# Patient Record
Sex: Female | Born: 1940 | Race: Black or African American | Hispanic: No | Smoking: Never smoker
Health system: Southern US, Community
[De-identification: ages and names within clinical notes are randomized; demographics above are authoritative.]

## PROBLEM LIST (undated history)

## (undated) DIAGNOSIS — Z972 Presence of dental prosthetic device (complete) (partial): Secondary | ICD-10-CM

## (undated) DIAGNOSIS — M810 Age-related osteoporosis without current pathological fracture: Secondary | ICD-10-CM

## (undated) DIAGNOSIS — I10 Essential (primary) hypertension: Secondary | ICD-10-CM

## (undated) DIAGNOSIS — E785 Hyperlipidemia, unspecified: Secondary | ICD-10-CM

## (undated) DIAGNOSIS — M1990 Unspecified osteoarthritis, unspecified site: Secondary | ICD-10-CM

## (undated) DIAGNOSIS — E039 Hypothyroidism, unspecified: Secondary | ICD-10-CM

## (undated) HISTORY — PX: HEMORROIDECTOMY: SUR656

## (undated) HISTORY — DX: Essential (primary) hypertension: I10

## (undated) HISTORY — PX: BREAST BIOPSY: SHX20

## (undated) HISTORY — DX: Hyperlipidemia, unspecified: E78.5

## (undated) HISTORY — DX: Unspecified osteoarthritis, unspecified site: M19.90

## (undated) HISTORY — DX: Hypothyroidism, unspecified: E03.9

## (undated) HISTORY — DX: Age-related osteoporosis without current pathological fracture: M81.0

## (undated) HISTORY — PX: TUBAL LIGATION: SHX77

## (undated) HISTORY — PX: ROTATOR CUFF REPAIR: SHX139

## (undated) HISTORY — PX: INTRAOCULAR LENS INSERTION: SHX110

---

## 1998-11-18 ENCOUNTER — Encounter: Payer: Self-pay | Admitting: Family Medicine

## 1998-11-18 ENCOUNTER — Encounter: Admission: RE | Admit: 1998-11-18 | Discharge: 1998-11-18 | Payer: Self-pay | Admitting: Family Medicine

## 1999-01-06 ENCOUNTER — Other Ambulatory Visit: Admission: RE | Admit: 1999-01-06 | Discharge: 1999-01-06 | Payer: Self-pay | Admitting: Family Medicine

## 1999-11-21 ENCOUNTER — Encounter: Admission: RE | Admit: 1999-11-21 | Discharge: 1999-11-21 | Payer: Self-pay | Admitting: Family Medicine

## 1999-11-21 ENCOUNTER — Encounter: Payer: Self-pay | Admitting: Family Medicine

## 2000-01-06 ENCOUNTER — Other Ambulatory Visit: Admission: RE | Admit: 2000-01-06 | Discharge: 2000-01-06 | Payer: Self-pay | Admitting: Family Medicine

## 2000-01-11 ENCOUNTER — Encounter: Admission: RE | Admit: 2000-01-11 | Discharge: 2000-01-11 | Payer: Self-pay | Admitting: Family Medicine

## 2000-01-11 ENCOUNTER — Encounter: Payer: Self-pay | Admitting: Family Medicine

## 2000-05-22 ENCOUNTER — Encounter: Payer: Self-pay | Admitting: Emergency Medicine

## 2000-05-22 ENCOUNTER — Emergency Department (HOSPITAL_COMMUNITY): Admission: EM | Admit: 2000-05-22 | Discharge: 2000-05-22 | Payer: Self-pay | Admitting: Emergency Medicine

## 2000-10-03 ENCOUNTER — Encounter: Payer: Self-pay | Admitting: Specialist

## 2000-10-10 ENCOUNTER — Ambulatory Visit: Admission: RE | Admit: 2000-10-10 | Discharge: 2000-10-10 | Payer: Self-pay | Admitting: Specialist

## 2000-11-21 ENCOUNTER — Encounter: Payer: Self-pay | Admitting: Family Medicine

## 2000-11-21 ENCOUNTER — Encounter: Admission: RE | Admit: 2000-11-21 | Discharge: 2000-11-21 | Payer: Self-pay | Admitting: Family Medicine

## 2001-01-07 ENCOUNTER — Other Ambulatory Visit: Admission: RE | Admit: 2001-01-07 | Discharge: 2001-01-07 | Payer: Self-pay | Admitting: Family Medicine

## 2001-11-26 ENCOUNTER — Encounter: Admission: RE | Admit: 2001-11-26 | Discharge: 2001-11-26 | Payer: Self-pay | Admitting: Family Medicine

## 2001-11-26 ENCOUNTER — Encounter: Payer: Self-pay | Admitting: Family Medicine

## 2002-01-08 ENCOUNTER — Other Ambulatory Visit: Admission: RE | Admit: 2002-01-08 | Discharge: 2002-01-24 | Payer: Self-pay | Admitting: Internal Medicine

## 2002-02-18 ENCOUNTER — Ambulatory Visit (HOSPITAL_COMMUNITY): Admission: RE | Admit: 2002-02-18 | Discharge: 2002-02-18 | Payer: Self-pay | Admitting: *Deleted

## 2002-02-18 ENCOUNTER — Encounter (INDEPENDENT_AMBULATORY_CARE_PROVIDER_SITE_OTHER): Payer: Self-pay

## 2002-11-28 ENCOUNTER — Encounter: Admission: RE | Admit: 2002-11-28 | Discharge: 2002-11-28 | Payer: Self-pay | Admitting: Family Medicine

## 2003-01-28 ENCOUNTER — Other Ambulatory Visit: Admission: RE | Admit: 2003-01-28 | Discharge: 2003-01-28 | Payer: Self-pay | Admitting: Family Medicine

## 2003-12-07 ENCOUNTER — Encounter: Admission: RE | Admit: 2003-12-07 | Discharge: 2003-12-07 | Payer: Self-pay | Admitting: Family Medicine

## 2004-04-07 ENCOUNTER — Ambulatory Visit: Payer: Self-pay | Admitting: Family Medicine

## 2004-04-26 ENCOUNTER — Ambulatory Visit: Payer: Self-pay | Admitting: Family Medicine

## 2004-04-28 ENCOUNTER — Ambulatory Visit: Payer: Self-pay | Admitting: Family Medicine

## 2004-05-02 ENCOUNTER — Encounter: Admission: RE | Admit: 2004-05-02 | Discharge: 2004-05-02 | Payer: Self-pay | Admitting: Family Medicine

## 2004-05-18 ENCOUNTER — Ambulatory Visit: Payer: Self-pay | Admitting: Family Medicine

## 2004-06-28 ENCOUNTER — Ambulatory Visit: Payer: Self-pay | Admitting: Family Medicine

## 2004-09-27 ENCOUNTER — Ambulatory Visit: Payer: Self-pay | Admitting: Family Medicine

## 2004-12-23 ENCOUNTER — Encounter: Admission: RE | Admit: 2004-12-23 | Discharge: 2004-12-23 | Payer: Self-pay | Admitting: Family Medicine

## 2005-02-07 ENCOUNTER — Ambulatory Visit: Payer: Self-pay | Admitting: Family Medicine

## 2005-02-16 ENCOUNTER — Ambulatory Visit: Payer: Self-pay | Admitting: Family Medicine

## 2005-03-02 ENCOUNTER — Ambulatory Visit: Payer: Self-pay | Admitting: Family Medicine

## 2005-08-31 ENCOUNTER — Ambulatory Visit: Payer: Self-pay | Admitting: Family Medicine

## 2005-09-05 ENCOUNTER — Ambulatory Visit: Payer: Self-pay | Admitting: Family Medicine

## 2005-12-26 ENCOUNTER — Encounter: Admission: RE | Admit: 2005-12-26 | Discharge: 2005-12-26 | Payer: Self-pay | Admitting: Family Medicine

## 2006-03-15 ENCOUNTER — Ambulatory Visit: Payer: Self-pay | Admitting: Family Medicine

## 2006-03-15 LAB — CONVERTED CEMR LAB
ALT: 16 units/L (ref 0–40)
AST: 18 units/L (ref 0–37)
CO2: 30 meq/L (ref 19–32)
Cholesterol: 212 mg/dL (ref 0–200)
Creatinine, Ser: 1.2 mg/dL (ref 0.4–1.2)
Direct LDL: 101 mg/dL
HDL: 80.4 mg/dL (ref >39.0)
Potassium: 4 meq/L (ref 3.5–5.1)
Sodium: 142 meq/L (ref 135–145)
Triglycerides: 99 mg/dL (ref 0–149)
VLDL: 20 mg/dL (ref 0–40)

## 2006-04-20 ENCOUNTER — Ambulatory Visit: Payer: Self-pay | Admitting: Family Medicine

## 2006-08-08 ENCOUNTER — Encounter (INDEPENDENT_AMBULATORY_CARE_PROVIDER_SITE_OTHER): Payer: Self-pay | Admitting: Internal Medicine

## 2006-08-21 ENCOUNTER — Encounter (INDEPENDENT_AMBULATORY_CARE_PROVIDER_SITE_OTHER): Payer: Self-pay | Admitting: Internal Medicine

## 2006-08-21 ENCOUNTER — Ambulatory Visit: Payer: Self-pay | Admitting: Family Medicine

## 2006-08-21 ENCOUNTER — Other Ambulatory Visit: Admission: RE | Admit: 2006-08-21 | Discharge: 2006-08-21 | Payer: Self-pay | Admitting: Family Medicine

## 2006-08-21 DIAGNOSIS — E7800 Pure hypercholesterolemia, unspecified: Secondary | ICD-10-CM | POA: Insufficient documentation

## 2006-08-21 DIAGNOSIS — M8580 Other specified disorders of bone density and structure, unspecified site: Secondary | ICD-10-CM | POA: Insufficient documentation

## 2006-08-21 DIAGNOSIS — I10 Essential (primary) hypertension: Secondary | ICD-10-CM | POA: Insufficient documentation

## 2006-08-21 DIAGNOSIS — E039 Hypothyroidism, unspecified: Secondary | ICD-10-CM

## 2006-08-23 LAB — CONVERTED CEMR LAB
ALT: 19 units/L (ref 0–35)
BUN: 15 mg/dL (ref 6–23)
CO2: 31 meq/L (ref 19–32)
Creatinine, Ser: 0.9 mg/dL (ref 0.4–1.2)
GFR calc Af Amer: 80 mL/min
GFR calc non Af Amer: 66 mL/min
HDL: 84.5 mg/dL (ref >39.0)
Total CHOL/HDL Ratio: 2.5

## 2006-08-27 ENCOUNTER — Encounter: Admission: RE | Admit: 2006-08-27 | Discharge: 2006-08-27 | Payer: Self-pay | Admitting: Family Medicine

## 2006-12-28 ENCOUNTER — Encounter: Admission: RE | Admit: 2006-12-28 | Discharge: 2006-12-28 | Payer: Self-pay | Admitting: Family Medicine

## 2007-01-08 ENCOUNTER — Encounter (INDEPENDENT_AMBULATORY_CARE_PROVIDER_SITE_OTHER): Payer: Self-pay | Admitting: *Deleted

## 2007-02-22 ENCOUNTER — Ambulatory Visit: Payer: Self-pay | Admitting: Family Medicine

## 2007-02-26 LAB — CONVERTED CEMR LAB
Cholesterol: 175 mg/dL (ref 0–200)
HDL: 76.2 mg/dL (ref >39.0)
LDL Cholesterol: 81 mg/dL (ref 0–99)
Total CHOL/HDL Ratio: 2.3
Triglycerides: 88 mg/dL (ref 0–149)
VLDL: 18 mg/dL (ref 0–40)

## 2007-08-26 ENCOUNTER — Ambulatory Visit: Payer: Self-pay | Admitting: Family Medicine

## 2007-08-27 LAB — CONVERTED CEMR LAB
ALT: 17 units/L (ref 0–35)
Albumin: 3.9 g/dL (ref 3.5–5.2)
Alkaline Phosphatase: 44 units/L (ref 39–117)
Bilirubin, Direct: 0.1 mg/dL (ref 0.0–0.3)
Cholesterol: 195 mg/dL (ref 0–200)
HDL: 81.9 mg/dL (ref >39.0)
Total CHOL/HDL Ratio: 2.4
Total Protein: 7.4 g/dL (ref 6.0–8.3)
Triglycerides: 61 mg/dL (ref 0–149)
VLDL: 12 mg/dL (ref 0–40)

## 2007-08-30 ENCOUNTER — Ambulatory Visit: Payer: Self-pay | Admitting: Family Medicine

## 2007-09-26 ENCOUNTER — Ambulatory Visit: Payer: Self-pay | Admitting: Family Medicine

## 2007-09-26 LAB — CONVERTED CEMR LAB: OCCULT 2: NEGATIVE

## 2007-09-27 ENCOUNTER — Encounter (INDEPENDENT_AMBULATORY_CARE_PROVIDER_SITE_OTHER): Payer: Self-pay | Admitting: *Deleted

## 2007-09-27 ENCOUNTER — Encounter (INDEPENDENT_AMBULATORY_CARE_PROVIDER_SITE_OTHER): Payer: Self-pay | Admitting: Internal Medicine

## 2007-12-30 ENCOUNTER — Encounter: Admission: RE | Admit: 2007-12-30 | Discharge: 2007-12-30 | Payer: Self-pay | Admitting: Family Medicine

## 2008-01-01 ENCOUNTER — Encounter (INDEPENDENT_AMBULATORY_CARE_PROVIDER_SITE_OTHER): Payer: Self-pay | Admitting: *Deleted

## 2008-02-13 ENCOUNTER — Ambulatory Visit: Payer: Self-pay | Admitting: Family Medicine

## 2008-02-17 LAB — CONVERTED CEMR LAB
Alkaline Phosphatase: 44 units/L (ref 39–117)
Cholesterol: 189 mg/dL (ref 0–200)
HDL: 87 mg/dL (ref >39.0)
Total Bilirubin: 1.1 mg/dL (ref 0.3–1.2)
Total CHOL/HDL Ratio: 2.2
Triglycerides: 88 mg/dL (ref 0–149)
VLDL: 18 mg/dL (ref 0–40)

## 2008-08-01 ENCOUNTER — Emergency Department (HOSPITAL_COMMUNITY): Admission: EM | Admit: 2008-08-01 | Discharge: 2008-08-01 | Payer: Self-pay | Admitting: Family Medicine

## 2008-08-17 ENCOUNTER — Ambulatory Visit: Payer: Self-pay | Admitting: Family Medicine

## 2008-08-19 LAB — CONVERTED CEMR LAB
CO2: 30 meq/L (ref 19–32)
Creatinine, Ser: 1 mg/dL (ref 0.4–1.2)
GFR calc non Af Amer: 70.55 mL/min (ref >60)
LDL Cholesterol: 97 mg/dL (ref 0–99)
Triglycerides: 76 mg/dL (ref 0.0–149.0)

## 2008-08-27 ENCOUNTER — Telehealth (INDEPENDENT_AMBULATORY_CARE_PROVIDER_SITE_OTHER): Payer: Self-pay | Admitting: Internal Medicine

## 2008-10-22 ENCOUNTER — Encounter (INDEPENDENT_AMBULATORY_CARE_PROVIDER_SITE_OTHER): Payer: Self-pay | Admitting: *Deleted

## 2008-10-22 ENCOUNTER — Ambulatory Visit: Payer: Self-pay | Admitting: Family Medicine

## 2008-11-10 ENCOUNTER — Ambulatory Visit: Payer: Self-pay | Admitting: Internal Medicine

## 2008-11-11 ENCOUNTER — Encounter (INDEPENDENT_AMBULATORY_CARE_PROVIDER_SITE_OTHER): Payer: Self-pay | Admitting: *Deleted

## 2008-11-11 LAB — CONVERTED CEMR LAB: Fecal Occult Bld: NEGATIVE

## 2008-12-02 ENCOUNTER — Encounter (INDEPENDENT_AMBULATORY_CARE_PROVIDER_SITE_OTHER): Payer: Self-pay | Admitting: Internal Medicine

## 2009-01-05 ENCOUNTER — Encounter: Admission: RE | Admit: 2009-01-05 | Discharge: 2009-01-05 | Payer: Self-pay | Admitting: Family Medicine

## 2009-01-05 ENCOUNTER — Encounter (INDEPENDENT_AMBULATORY_CARE_PROVIDER_SITE_OTHER): Payer: Self-pay | Admitting: Internal Medicine

## 2009-01-06 ENCOUNTER — Encounter: Payer: Self-pay | Admitting: Family Medicine

## 2009-01-12 ENCOUNTER — Encounter: Payer: Self-pay | Admitting: Family Medicine

## 2009-02-15 ENCOUNTER — Ambulatory Visit: Payer: Self-pay | Admitting: Family Medicine

## 2009-02-15 DIAGNOSIS — R7303 Prediabetes: Secondary | ICD-10-CM | POA: Insufficient documentation

## 2009-02-16 LAB — CONVERTED CEMR LAB
ALT: 17 units/L (ref 0–35)
AST: 21 units/L (ref 0–37)
Cholesterol: 197 mg/dL (ref 0–200)
Glucose, Bld: 107 mg/dL — ABNORMAL HIGH (ref 70–99)
HDL: 94.8 mg/dL (ref >39.00)
LDL Cholesterol: 84 mg/dL (ref 0–99)
Total CHOL/HDL Ratio: 2
Triglycerides: 89 mg/dL (ref 0.0–149.0)
VLDL: 17.8 mg/dL (ref 0.0–40.0)

## 2009-05-03 ENCOUNTER — Telehealth: Payer: Self-pay | Admitting: Family Medicine

## 2009-08-20 ENCOUNTER — Ambulatory Visit: Payer: Self-pay | Admitting: Internal Medicine

## 2009-10-19 ENCOUNTER — Ambulatory Visit: Payer: Self-pay | Admitting: Family Medicine

## 2009-10-19 ENCOUNTER — Telehealth (INDEPENDENT_AMBULATORY_CARE_PROVIDER_SITE_OTHER): Payer: Self-pay | Admitting: *Deleted

## 2009-10-19 LAB — CONVERTED CEMR LAB
AST: 23 units/L (ref 0–37)
CO2: 28 meq/L (ref 19–32)
Cholesterol: 209 mg/dL — ABNORMAL HIGH (ref 0–200)
Creatinine, Ser: 1 mg/dL (ref 0.4–1.2)
GFR calc non Af Amer: 72.83 mL/min (ref >60)
Glucose, Bld: 103 mg/dL — ABNORMAL HIGH (ref 70–99)
HDL: 93 mg/dL (ref >39.00)
Sodium: 139 meq/L (ref 135–145)
Total CHOL/HDL Ratio: 2
Triglycerides: 66 mg/dL (ref 0.0–149.0)
VLDL: 13.2 mg/dL (ref 0.0–40.0)

## 2009-10-20 LAB — CONVERTED CEMR LAB: Vit D, 25-Hydroxy: 35 ng/mL (ref 30–89)

## 2009-10-26 ENCOUNTER — Ambulatory Visit: Payer: Self-pay | Admitting: Family Medicine

## 2009-11-09 ENCOUNTER — Ambulatory Visit: Payer: Self-pay | Admitting: Family Medicine

## 2009-11-16 ENCOUNTER — Ambulatory Visit: Payer: Self-pay | Admitting: Family Medicine

## 2009-11-16 LAB — CONVERTED CEMR LAB: Fecal Occult Bld: NEGATIVE

## 2010-01-06 ENCOUNTER — Encounter
Admission: RE | Admit: 2010-01-06 | Discharge: 2010-01-06 | Payer: Self-pay | Source: Home / Self Care | Attending: Family Medicine | Admitting: Family Medicine

## 2010-02-08 NOTE — Progress Notes (Signed)
----   Converted from flag ---- ---- 10/18/2009 5:51 PM, Eustaquio Boyden  MD wrote: TSH, CMP, FLP, vit D, 733.90, 401.9, 272.4, 244.9  ---- 10/18/2009 4:08 PM, Melody Comas wrote: Patient coming in for cpx labs tomorrow. What labs to draw and diagnosis please. ------------------------------

## 2010-02-08 NOTE — Assessment & Plan Note (Signed)
Summary: COLD,ST/CLE   Vital Signs:  Patient profile:   70 year old female Weight:      159.50 pounds Temp:     99.1 degrees F oral Pulse rate:   100 / minute Pulse rhythm:   regular BP sitting:   178 / 60  (left arm) Cuff size:   regular  Vitals Entered By: Selena Batten Dance CMA Duncan Dull) (August 20, 2009 3:26 PM) CC: ? Cold   History of Present Illness: CC: ? cold  3-4 d h/o cold sxs.  Scratchy throat, tender on right side, tight on right, hoarseness.  Mild coughing.  No congestion, mild RN.  No fevers/chills, no sick contacts.  Has tried tylenol as well as allergy medicine (not really helping).  No trouble breathing, no problems opening mouth, no drooling.  + dog outside.  No smoking.  Current Medications (verified): 1)  Pravastatin Sodium 40 Mg Tabs (Pravastatin Sodium) .... Take 1 Tablet By Mouth Once A Day 2)  Losartan Potassium-Hctz 50-12.5 Mg Tabs (Losartan Potassium-Hctz) .... Take 1 Tablet By Mouth Once A Day 3)  Calcium 600/vitamin D 600-200 Mg-Unit  Tabs (Calcium Carbonate-Vitamin D) .... Take 1 Tablet By Mouth Daily 4)  Fish Oil 1000 Mg Caps (Omega-3 Fatty Acids) .... Take 1 Capsule By Mouth Once A Day  Allergies: 1)  ! Pcn  Past History:  Past Medical History: HTN HLD  Social History: No smoking Marital Status: Married x 48 yrs, husband died (12/10/2005) Children: 3 , 6 grandchildren Occupation: retired, working at daycare 5d/wk 7h/d  Review of Systems       per HPI  Physical Exam  General:  Well-developed,well-nourished,in no acute distress; alert,appropriate and cooperative throughout examination Head:  Normocephalic and atraumatic without obvious abnormalities. No apparent alopecia or balding. Eyes:  No corneal or conjunctival inflammation noted. EOMI. Perrla.  Ears:  External ear exam shows no significant lesions or deformities.  Otoscopic examination reveals clear canals, tympanic membranes are intact bilaterally without bulging, retraction, inflammation  or discharge. Hearing is grossly normal bilaterally. Nose:  External nasal examination shows no deformity or inflammation. Nasal mucosa are pink and moist without lesions or exudates. Mouth:  erythema of pharynx, no edema/exudates, fluctuance.  Dentures in place. Neck:  R anterior cervical swelling, with erythema, tenderness to palpation.  no fluctuance. Lungs:  normal respiratory effort, no intercostal retractions, no accessory muscle use, and normal breath sounds.   Heart:  normal rate, regular rhythm, and no murmur.   Pulses:  2+ periph pulses Extremities:  no edema either lower legs Skin:  turgor normal, color normal, and no rashes.     Impression & Recommendations:  Problem # 1:  STREPTOCOCCAL PHARYNGITIS (ICD-034.0) rapid strep checked 2/2 evident swelling in throat, concern for developing suppurative complication.  No evident abscess on exam today.  rapid strep +.  Allergy to penicillin (hallucinations) so treated with Zpack.  RTC for red flags.  Salt water gargles and lozenges for laryngitis.  Her updated medication list for this problem includes:    Zithromax Z-pak 250 Mg Tabs (Azithromycin) ..... Use as directed  Complete Medication List: 1)  Pravastatin Sodium 40 Mg Tabs (Pravastatin sodium) .... Take 1 tablet by mouth once a day 2)  Losartan Potassium-hctz 50-12.5 Mg Tabs (Losartan potassium-hctz) .... Take 1 tablet by mouth once a day 3)  Calcium 600/vitamin D 600-200 Mg-unit Tabs (Calcium carbonate-vitamin d) .... Take 1 tablet by mouth daily 4)  Fish Oil 1000 Mg Caps (Omega-3 fatty acids) .... Take 1 capsule by  mouth once a day 5)  Zithromax Z-pak 250 Mg Tabs (Azithromycin) .... Use as directed  Patient Instructions: 1)  Rapid strep today positive.  We will treat you with antibiotics. 2)  For throat - salt water gargles daily, suck on lozenges or popsicles, continue tylenol. 3)  Please return sooner if fevers or not improving as expected, or woresning swelling of throat or  worsening trouble swallowing. 4)  Plesaure to meet you today.  Call clinic with questions. Prescriptions: ZITHROMAX Z-PAK 250 MG TABS (AZITHROMYCIN) use as directed  #1 x 0   Entered and Authorized by:   Eustaquio Boyden  MD   Signed by:   Eustaquio Boyden  MD on 08/20/2009   Method used:   Electronically to        [facility]  [address] [phone][facility] (retail)       [address]       [place], [place]  [address]       Ph: [phone]       Fax: [phone]   RxID:   [id number]   Current Allergies (reviewed today): ! PCN  Laboratory Results  Date/Time Received: August 20, 2009 3:46 PM  Date/Time Reported: August 20, 2009 3:47 PM   Other Tests  Rapid Strep: positive

## 2010-02-08 NOTE — Assessment & Plan Note (Signed)
Summary: SHRINGLES...CYD  Nurse Visit   Allergies: 1)  ! Pcn  Immunizations Administered:  Zostavax # 1:    Vaccine Type: Zostavax    Site: left deltoid    Mfr: Merck    Dose: 0.55ml    Route: [place]    Given by: Linde Gillis CMA (AAMA)    Exp. Date: 08/27/2010    Lot #: [id number]    VIS given: 10/21/04 given November 09, 2009.  Orders Added: 1)  Zoster (Shingles) Vaccine Live [90736] 2)  Admin 1st Vaccine [id number]

## 2010-02-08 NOTE — Miscellaneous (Signed)
Summary: Waiver of Liability for Zostavax  Waiver of Liability for Zostavax   Imported By: Maryln Gottron 11/15/2009 10:29:30  _____________________________________________________________________  External Attachment:    Type:   Image     Comment:   External Document

## 2010-02-08 NOTE — Progress Notes (Signed)
Summary: Alternative to Benicar  Phone Note From Pharmacy   Caller: [facility]  [address] [phone][facility] Call For: Dr. Ermalene Searing  Summary of Call: Received fax from pharmacy stating that patient request a lower cost alternative to Benicar.  Alternative medication is Losartan-HCTZ.  Form in your IN box please advise. Initial call taken by: Linde Gillis CMA Duncan Dull),  May 03, 2009 9:54 AM  Follow-up for Phone Call        i always leave this up to the patient. OK to wait until Dr. Ermalene Searing can respond. Follow-up by: Hannah Beat MD,  May 03, 2009 1:04 PM  Additional Follow-up for Phone Call Additional follow up Details #1::        From completed. Okay to change, but let pt know first and make sure okay with her. Should be equivalent..but make sure she watches her BP in next few weeks on new medicaiton. Call if greater than 140/90. Additional Follow-up by: Kerby Nora MD,  May 04, 2009 8:08 AM    Additional Follow-up for Phone Call Additional follow up Details #2::    patient advised.Consuello Masse CMA  Follow-up by: Benny Lennert CMA Duncan Dull),  May 04, 2009 1:56 PM  New/Updated Medications: LOSARTAN POTASSIUM-HCTZ 50-12.5 MG TABS (LOSARTAN POTASSIUM-HCTZ) Take 1 tablet by mouth once a day

## 2010-02-08 NOTE — Letter (Signed)
Summary: Results Follow up Letter  [place] [facility]  [address] [place], [place] [address]   Phone: [phone]  Fax: [phone]    01/12/2009 MRN: [id number]    [place] Petersen [address] [place], [place]  [address]    Dear [name],  The following are the results of your recent test(s):  Test         Result    Pap Smear:        Normal _____  Not Normal _____ Comments: ______________________________________________________ Cholesterol: LDL(Bad cholesterol):         Your goal is less than:         HDL (Good cholesterol):       Your goal is more than: Comments:  ______________________________________________________ Mammogram:        Normal __X___  Not Normal _____ Comments:Please repeat in one year.  ___________________________________________________________________ Hemoccult:        Normal _____  Not normal _______ Comments:    _____________________________________________________________________ Other Tests:    We routinely do not discuss normal results over the telephone.  If you desire a copy of the results, or you have any questions about this information we can discuss them at your next office visit.   Sincerely,    Everrett Coombe, FNP  BB/ri

## 2010-02-08 NOTE — Assessment & Plan Note (Signed)
Summary: CPX RESCH-SENT LTR.Marland Kitchen CYD   Vital Signs:  Patient profile:   70 year old female Height:      62.25 inches Weight:      158.5 pounds BMI:     28.86 Temp:     97.8 degrees F oral Pulse rate:   88 / minute Pulse rhythm:   regular BP sitting:   142 / 70  (left arm) Cuff size:   regular  Vitals Entered By: Benny Lennert CMA Duncan Dull) (October 26, 2009 11:14 AM)  History of Present Illness: Chief complaint cpx   HTN, moderate control on losartan  High cholesterol, well controlled on pravastatin   Hypothyroid TSH: 2.92 (10/19/2009 9:28:05 AM) well controlled on no medicaitons.  Prediabetes, improved  Preventive Screening-Counseling & Management  Caffeine-Diet-Exercise     Does Patient Exercise: yes      Drug Use:  no.    Problems Prior to Update: 1)  Hypothyroidism  (ICD-244.9) 2)  Hypertension, Benign Essential  (ICD-401.1) 3)  Hypercholesterolemia, Pure  (ICD-272.0) 4)  Hyperglycemia  (ICD-790.29) 5)  Other Screening Mammogram  (ICD-V76.12) 6)  Screening For Malignant Neoplasm, Cervix  (ICD-V76.2) 7)  Osteopenia  (ICD-733.90)  Current Medications (verified): 1)  Pravastatin Sodium 40 Mg Tabs (Pravastatin Sodium) .... Take 1 Tablet By Mouth Once A Day 2)  Losartan Potassium-Hctz 50-12.5 Mg Tabs (Losartan Potassium-Hctz) .... Take 1 Tablet By Mouth Once A Day 3)  Calcium 600/vitamin D 600-200 Mg-Unit  Tabs (Calcium Carbonate-Vitamin D) .... Take 1 Tablet By Mouth Daily 4)  Fish Oil 1000 Mg Caps (Omega-3 Fatty Acids) .... Take 1 Capsule By Mouth Once A Day  Allergies: 1)  ! Pcn  Past History:  Past medical, surgical, family and social histories (including risk factors) reviewed, and no changes noted (except as noted below).  Past Medical History: HTN  Past Surgical History: BTL right shoulder surgery  Family History: Reviewed history and no changes required. father: HTN mother: HTN siblings: no cancer, no CAD  Social History: Reviewed history from  08/20/2009 and no changes required. No smoking Marital Status: Married x 48 yrs, husband died (06-Dec-2005 Children: 3 , 6 grandchildren Occupation: retired, working at daycare 5d/wk 7h/d Alcohol use-no Drug use-no Regular exercise-yes, walking occ Does Patient Exercise:  yes  Review of Systems General:  Denies fatigue and fever. CV:  Denies chest pain or discomfort. Resp:  Denies shortness of breath, sputum productive, and wheezing. GI:  Denies abdominal pain, bloody stools, constipation, and diarrhea. GU:  Denies abnormal vaginal bleeding, dysuria, and urinary frequency. Derm:  Denies lesion(s). Psych:  Denies anxiety and depression.  Physical Exam  General:  Well-developed,well-nourished,in no acute distress; alert,appropriate and cooperative throughout examination Ears:  External ear exam shows no significant lesions or deformities.  Otoscopic examination reveals clear canals, tympanic membranes are intact bilaterally without bulging, retraction, inflammation or discharge. Hearing is grossly normal bilaterally. Nose:  External nasal examination shows no deformity or inflammation. Nasal mucosa are pink and moist without lesions or exudates. Mouth:  Oral mucosa and oropharynx without lesions or exudates.  Teeth in good repair. Neck:  no carotid bruit or thyromegaly no cervical or supraclavicular lymphadenopathy  Chest Wall:  No deformities, masses, or tenderness noted. Breasts:  No mass, nodules, thickening, tenderness, bulging, retraction, inflamation, nipple discharge or skin changes noted.   Lungs:  Normal respiratory effort, chest expands symmetrically. Lungs are clear to auscultation, no crackles or wheezes. Heart:  Normal rate and regular rhythm. S1 and S2 normal without gallop, murmur, click,  rub or other extra sounds. Abdomen:  Bowel sounds positive,abdomen soft and non-tender without masses, organomegaly or hernias noted. Genitalia:  Pelvic Exam:        External: normal female  genitalia without lesions or masses        Vagina: normal without lesions or masses        Cervix: normal without lesions or masses        Adnexa: normal bimanual exam without masses or fullness        Uterus: normal by palpation        Pap smear: not performed Pulses:  R and L posterior tibial pulses are full and equal bilaterally  Extremities:  no edema  Skin:  Intact without suspicious lesions or rashes Psych:  Cognition and judgment appear intact. Alert and cooperative with normal attention span and concentration. No apparent delusions, illusions, hallucinations   Impression & Recommendations:  Problem # 1:  HYPOTHYROIDISM (ICD-244.9) Well controlled.  Labs Reviewed: TSH: 2.92 (10/19/2009)    Chol: 209 (10/19/2009)   HDL: 93.00 (10/19/2009)   LDL: 84 (02/15/2009)   TG: 66.0 (10/19/2009)  Problem # 2:  HYPERTENSION, BENIGN ESSENTIAL (ICD-401.1)  Well controlled. Continue current medication.  Anxious about CPX Her updated medication list for this problem includes:    Losartan Potassium-hctz 50-12.5 Mg Tabs (Losartan potassium-hctz) .Marland Kitchen... Take 1 tablet by mouth once a day  BP today: 142/70 Prior BP: 178/60 (08/20/2009)  Labs Reviewed: K+: 4.0 (10/19/2009) Creat: : 1.0 (10/19/2009)   Chol: 209 (10/19/2009)   HDL: 93.00 (10/19/2009)   LDL: 84 (02/15/2009)   TG: 66.0 (10/19/2009)  Problem # 3:  HYPERCHOLESTEROLEMIA, PURE (ICD-272.0)  Well controlled. Continue current medication.  Her updated medication list for this problem includes:    Pravastatin Sodium 40 Mg Tabs (Pravastatin sodium) .Marland Kitchen... Take 1 tablet by mouth once a day  Labs Reviewed: SGOT: 23 (10/19/2009)   SGPT: 17 (10/19/2009)   HDL:93.00 (10/19/2009), 94.80 (02/15/2009)  LDL:84 (02/15/2009), 97 (08/17/2008)  Chol:209 (10/19/2009), 197 (02/15/2009)  Trig:66.0 (10/19/2009), 89.0 (02/15/2009)  Problem # 4:  HYPERGLYCEMIA (ICD-790.29) Encouraged exercise, weight loss, healthy eating habits.   Complete Medication  List: 1)  Pravastatin Sodium 40 Mg Tabs (Pravastatin sodium) .... Take 1 tablet by mouth once a day 2)  Losartan Potassium-hctz 50-12.5 Mg Tabs (Losartan potassium-hctz) .... Take 1 tablet by mouth once a day 3)  Calcium 600/vitamin D 600-200 Mg-unit Tabs (Calcium carbonate-vitamin d) .... Take 1 tablet by mouth daily 4)  Fish Oil 1000 Mg Caps (Omega-3 fatty acids) .... Take 1 capsule by mouth once a day  Other Orders: Radiology Referral (Radiology) Pneumococcal Vaccine (94854) Admin 1st Vaccine (62703)  Patient Instructions: 1)  Work on increasing regular exercise. 2)  Referral Appointment Information 3)  Day/Date: 4)  Time: 5)  Place/MD: 6)  Address: 7)  Phone/Fax: 8)  Patient given appointment information. Information/Orders faxed/mailed.  9)  Return stool cards. 10)   Look into insurance coverage of shingles vaccine (Zostavax). 11)  Fasting lipids, CMET in 6 months.   Orders Added: 1)  Radiology Referral [Radiology] 2)  Est. Patient Level IV [50093] 3)  Pneumococcal Vaccine [90732] 4)  Admin 1st Vaccine [81829]   Immunizations Administered:  Pneumonia Vaccine:    Vaccine Type: Pneumovax    Site: left deltoid    Mfr: Merck    Dose: 0.5 ml    Route: IM    Given by: Linde Gillis CMA (AAMA)    Exp. Date: 03/24/2011    Lot #:  [id number]    VIS given: 12/14/08 version given October 26, 2009.   Immunizations Administered:  Pneumonia Vaccine:    Vaccine Type: Pneumovax    Site: left deltoid    Mfr: Merck    Dose: 0.5 ml    Route: IM    Given by: Linde Gillis CMA (AAMA)    Exp. Date: 03/24/2011    Lot #: [id number]    VIS given: 12/14/08 version given October 26, 2009.  Current Allergies (reviewed today): ! PCN Last Flu Vaccine:  Fluvax 3+ (10/16/2008 2:33:34 PM) Flu Vaccine Result Date:  10/06/2009 Flu Vaccine Result:  given Flu Vaccine Next Due:  1 yr Flex Sig Next Due:  Not Indicated Colonoscopy Next Due:  Refused PAP Result Date:  10/26/2009 PAP Result:  DVE  yearly

## 2010-04-25 ENCOUNTER — Other Ambulatory Visit: Payer: Self-pay | Admitting: *Deleted

## 2010-04-25 DIAGNOSIS — E7800 Pure hypercholesterolemia, unspecified: Secondary | ICD-10-CM

## 2010-04-25 DIAGNOSIS — I10 Essential (primary) hypertension: Secondary | ICD-10-CM

## 2010-04-30 ENCOUNTER — Other Ambulatory Visit: Payer: Self-pay | Admitting: Family Medicine

## 2010-05-02 ENCOUNTER — Other Ambulatory Visit (INDEPENDENT_AMBULATORY_CARE_PROVIDER_SITE_OTHER): Payer: Medicare Other | Admitting: Family Medicine

## 2010-05-02 DIAGNOSIS — E7800 Pure hypercholesterolemia, unspecified: Secondary | ICD-10-CM

## 2010-05-02 DIAGNOSIS — I10 Essential (primary) hypertension: Secondary | ICD-10-CM

## 2010-05-02 LAB — LIPID PANEL: Cholesterol: 206 mg/dL — ABNORMAL HIGH (ref 0–200)

## 2010-05-02 LAB — COMPREHENSIVE METABOLIC PANEL
ALT: 16 U/L (ref 0–35)
AST: 21 U/L (ref 0–37)
BUN: 16 mg/dL (ref 6–23)
CO2: 29 mEq/L (ref 19–32)
Calcium: 9.7 mg/dL (ref 8.4–10.5)
GFR: 77.29 mL/min (ref >60.00)
Glucose, Bld: 92 mg/dL (ref 70–99)
Total Protein: 7 g/dL (ref 6.0–8.3)

## 2010-05-02 LAB — LDL CHOLESTEROL, DIRECT: Direct LDL: 83.9 mg/dL

## 2010-05-27 NOTE — Op Note (Signed)
   NAMEDENEISHA, DADE                         ACCOUNT NO.:  [id number]   MEDICAL RECORD NO.:  [id number]                   PATIENT TYPE:  AMB   LOCATION:  DAY                                  FACILITY:  [facility]   PHYSICIAN:  Vikki Ports, M.D.         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   PROCEDURE:  Right breast major duct excision.   SURGEON:  Vikki Ports, M.D.   ANESTHESIA:  General.   DESCRIPTION OF PROCEDURE:  The patient was taken to the operating room and  placed in the supine position after adequate general anesthesia was induced.  The right breast was prepped and draped in normal sterile fashion.  An area  of discharging duct in the 3 o'clock region of the right breast was  identified and cannulated with a tear duct probe.  A curvilinear incision  was made then at the areolar edge and dissected down, excising the entire  duct around the tear duct probe.  This was taken up to just behind the  nipple.  This was sent for pathologic evaluation.  Adequate hemostasis was  assured, and the skin was closed with subcuticular 4-0 Monocryl and Steri-  Strips.  Sterile dressings were applied.  The patient tolerated the  procedure well and went to PACU in good condition.                                               Vikki Ports, M.D.    KRH/MEDQ  D:  03/12/2002  T:  03/12/2002  Job:  [id number]

## 2010-05-27 NOTE — Op Note (Signed)
[facility]  Patient:    [place] Petersen, Alexandria Petersen Visit Number: [id number] MRN: [id number]          Service Type: SUR Location: 1S X001 01 Attending Physician:  Pierce Crane Proc. Date: 10/10/00 Admit Date:  10/10/2000                             Operative Report  PREOPERATIVE DIAGNOSIS:  Impingement syndrome of the right shoulder.  POSTOPERATIVE DIAGNOSES:  Impingement syndrome of the right shoulder, dorsal tear of rotator cuff tendon.  PROCEDURE:  Right shoulder arthroscopy, subacromial decompression with acromioplasty, bursectomy, lavage of the glenohumeral joint.  SURGEON:  Javier Docker, M.D.  BRIEF HISTORY AND INDICATION:  This is a 70 year old with refractory right shoulder pain following a work-related injury.  MRI indicating tendinosis with a prominent anterior spur.  The patient had temporary relief with a subacromial corticosteroid injection.  Had no AC tenderness.  Some mild degenerative changes on the [facility] joint noted.  These are persistent symptomatology precluding her progression to full rehabilitation.  Operative intervention was indicated for decompression of the subacromial joint, bursectomy, etc.  Risks and benefits discussed including bleeding, infection, damage to vascular structures, no change in symptoms, suboptimal range of motion, persistent pain, etc.  TECHNIQUE:  The patient is in supine beach chair position.  After the induction of adequate general anesthesia, antibiotics for antimicrobial prophylaxis, the right upper extremity and shoulder was prepped and draped in the usual sterile fashion.  The outlines of the acromion and the clavicle were demarcated with a surgical marker.  Standard posterolateral, anterolateral, and anterior portals were then incised through the skin only with a #11 blade. This was after infiltration with 0.25% Marcaine with epinephrine.  With the arm in the 70-30 position, the  arthroscopic cannula was then advanced through the glenohumeral joint in line with the coracoid through the posterolateral corner with atraumatic penetration in the glenohumeral joint.  Dissection of the glenohumeral joint revealed some mild degenerative changes of the humerus, the glenoid.  Examination revealed no greater than 50% anterior and posterior translation or inferior translation.  Some mild cartilaginous debris noted, and this was lavaged.  Full inspection of the rotator cuff revealed no evidence of an underside tear.  Subscapularis was unremarkable as was the glenoid labrum.  The biceps tendon was intact.  Next, after the lavage of the glenohumeral joint, the cannula was redirected in the subacromial region and into the anterolateral portal, a shaver was introduced.  Hypertrophic bursa was noted throughout the subacromial region, and this was removed with the shaver from the anterolateral aspect of the acromion through the [facility] joint, demarcated under visualization.  ACL ligament was detached utilizing the [employer] electrocautery as was removal of the periosteum from the anterior aspect of the acromion.  The prominent anterior osteophytic spur, extending from the anterolateral aspect of the acromion to the [facility] joint was noted, impinging upon the rotator cuff.  The cuff was examined, and there was a minor bursal side tear, as noted.  No evidence of a full-thickness tear.  A bur was then introduced through the anterolateral portal and utilized to remove the anterior aspect of the acromion.  Excellent space was noted following the decompression and the bursectomy, and full examination of the rotator cuff revealed no evidence of the tear.  Next the subacromial joint was fully lavaged.  Full range of motion revealed no residual impingement.  All instrumentation was removed,  and the portals were closed with 4-0 nylon simple sutures.  The wound was dressed sterilely.  The patient was  placed in a sling, extubated without difficulty and transported to the recovery room in satisfactory condition.  The patient tolerated the procedure well with no complication. Attending Physician:  Pierce Crane DD:  10/10/00 TD:  10/10/00 Job: [id number]

## 2010-05-27 NOTE — H&P (Signed)
[facility], [place]  Patient:    Alexandria Petersen, Alexandria Petersen Visit Number: [id number] MRN: [id number]          Service Type: Attending:  Javier Docker, M.D. Dictated by:   Dorie Rank, P.A. Adm. Date:  10/10/00   CC:         [place] Constable, N.P.   History and Physical  DATE OF BIRTH:  07-20-2040  CHIEF COMPLAINT:  Right shoulder pain.  HISTORY OF PRESENT ILLNESS:  Ms. Prew is a pleasant 70 year old female who has had persistent shoulder pain.  This all started in May of this year when she fell at work.  She has tried conservative measures, including corticosteroid injections, physical therapy, time, and activity modifications, and NSAIDs, all of which proved to be only minimal relief.  She had an MRI of the right shoulder on Jun 08, 2000, which revealed mild osteoarthritis and rotator cuff tendonosis.  No frank rotator cuff tear was demonstrated.  It was felt that due to her ongoing pain, physical limitations, and diagnostic studies that she would benefit from undergoing a subacromial decompression and bursectomy to avoid progression to adhesive capsulitis.  The risks and benefits, as well as the procedure in detail were discussed with her.  She was given illustrated handouts.  She agreed to proceed.  PAST MEDICAL HISTORY:  Significant for hypothyroidism and hypercholesterolemia.  MEDICATIONS: 1. Fosamax 5 mg one p.o. every week. 2. Synthroid 50 mcg one p.o. q.d. 3. Celebrex 200 mg one p.o. q.d. 4. Pravachol 40 mg one p.o. q.d.  ALLERGIES:  No known drug allergies.  PAST SURGICAL HISTORY:  Tubal ligation x 30 years ago.  SOCIAL HISTORY:  The patient is right-hand dominant.  She is married.  She has two children and six grandchildren.  No history of alcohol or tobacco use. She is a Administrator, sports at [employer].  She is currently on light duty.  FAMILY HISTORY:  Mother, living, age 15, with diabetes with questionable Alzheimers disease.  Father deceased, age 63,  with no medical history on him.  REVIEW OF SYSTEMS:  General:  No fevers, chills, night sweats, or bleeding tendencies.  Pulmonary:  No shortness of breath, productive cough, or hemoptysis.  She has recent nasal congestion with sneezing.  No postnasal drainage.  Cardiovascular:  No chest pain, angina, or orthopnea.  Endocrine: No history of diabetes mellitus.  Positive for thyroid discussed above. Neurologic:  No seizures, headaches, or paralysis.  GI:  No melena, constipation, nausea, or vomiting.  GU:  No hematuria, dysuria, or discharge.  PHYSICAL EXAMINATION:  A 70 year old black female, well developed, well nourished, alert, and oriented.  HEENT:  The head is normocephalic and atraumatic.  The oropharynx is clear.  NECK:  Supple.  Negative for cervical lymphadenopathy.  Negative for carotid bruits.  CHESTS:  The lungs are clear to auscultation bilaterally.  No wheezes, rales, or rhonchi.  BREASTS:  Not pertinent to present illness.  HEART:  S1 and S2.  Negative for murmurs, rubs, or gallops.  ABDOMEN:  Soft and nontender.  Positive bowel sounds.  GENITOURINARY:  Not pertinent to present illness.  EXTREMITIES:  She has decreased range of motion to the right shoulder with abduction to about 90 degrees.  She does have a positive impingement sign. Tenderness on palpation over the anterior subacromial region and the posterior subacromial region.  Nontender over the [facility] joint.  Negative crossover maneuver.  Painless range of motion of the cervical spine.  She was noted to have decreased internal  rotation.  She can raise her had to approximately the L5 spinous process.  SKIN:  Acyanotic.  No rashes or lesions appreciated on exam.  PREOPERATIVE LABORATORY DATA AND X-RAYS:  Pending today.  IMPRESSION: 1. Right impingement syndrome. 2. Hypothyroidism. 3. Hypercholesterolemia.  PLAN:  The patient is scheduled for a right subacromial decompression and bursectomy with Javier Docker, M.D. Dictated by:   Dorie Rank, P.A. Attending:  Javier Docker, M.D. DD:  09/27/00 TD:  09/27/00 Job: [phone] [id number]

## 2010-10-14 ENCOUNTER — Telehealth: Payer: Self-pay | Admitting: Family Medicine

## 2010-10-14 DIAGNOSIS — E039 Hypothyroidism, unspecified: Secondary | ICD-10-CM

## 2010-10-14 DIAGNOSIS — E7800 Pure hypercholesterolemia, unspecified: Secondary | ICD-10-CM

## 2010-10-14 DIAGNOSIS — M949 Disorder of cartilage, unspecified: Secondary | ICD-10-CM

## 2010-10-14 NOTE — Telephone Encounter (Signed)
Message copied by Excell Seltzer on Fri Oct 14, 2010  2:03 PM ------      Message from: Baldomero Lamy      Created: Fri Oct 14, 2010 10:09 AM      Regarding: Cpx labs Wed 10/10       Please order  future cpx labs for pt's upcomming lab appt.      Thanks      Rodney Booze

## 2010-10-19 ENCOUNTER — Other Ambulatory Visit (INDEPENDENT_AMBULATORY_CARE_PROVIDER_SITE_OTHER): Payer: Medicare Other

## 2010-10-19 DIAGNOSIS — E7800 Pure hypercholesterolemia, unspecified: Secondary | ICD-10-CM

## 2010-10-19 DIAGNOSIS — M949 Disorder of cartilage, unspecified: Secondary | ICD-10-CM

## 2010-10-19 DIAGNOSIS — E039 Hypothyroidism, unspecified: Secondary | ICD-10-CM

## 2010-10-19 LAB — COMPREHENSIVE METABOLIC PANEL
Albumin: 4.4 g/dL (ref 3.5–5.2)
BUN: 19 mg/dL (ref 6–23)
CO2: 30 mEq/L (ref 19–32)
Calcium: 9.6 mg/dL (ref 8.4–10.5)
Chloride: 104 mEq/L (ref 96–112)
Creatinine, Ser: 0.9 mg/dL (ref 0.4–1.2)
Sodium: 142 mEq/L (ref 135–145)
Total Protein: 7.9 g/dL (ref 6.0–8.3)

## 2010-10-19 LAB — LIPID PANEL
Cholesterol: 218 mg/dL — ABNORMAL HIGH (ref 0–200)
HDL: 100.9 mg/dL (ref >39.00)
Total CHOL/HDL Ratio: 2

## 2010-10-22 ENCOUNTER — Other Ambulatory Visit: Payer: Self-pay | Admitting: Family Medicine

## 2010-10-26 ENCOUNTER — Encounter: Payer: Self-pay | Admitting: Family Medicine

## 2010-10-28 ENCOUNTER — Ambulatory Visit (INDEPENDENT_AMBULATORY_CARE_PROVIDER_SITE_OTHER): Payer: Medicare Other | Admitting: Family Medicine

## 2010-10-28 ENCOUNTER — Encounter: Payer: Self-pay | Admitting: Family Medicine

## 2010-10-28 DIAGNOSIS — I10 Essential (primary) hypertension: Secondary | ICD-10-CM

## 2010-10-28 DIAGNOSIS — E039 Hypothyroidism, unspecified: Secondary | ICD-10-CM

## 2010-10-28 DIAGNOSIS — Z1231 Encounter for screening mammogram for malignant neoplasm of breast: Secondary | ICD-10-CM

## 2010-10-28 DIAGNOSIS — E876 Hypokalemia: Secondary | ICD-10-CM

## 2010-10-28 DIAGNOSIS — Z0000 Encounter for general adult medical examination without abnormal findings: Secondary | ICD-10-CM

## 2010-10-28 DIAGNOSIS — E7800 Pure hypercholesterolemia, unspecified: Secondary | ICD-10-CM

## 2010-10-28 DIAGNOSIS — Z780 Asymptomatic menopausal state: Secondary | ICD-10-CM

## 2010-10-28 DIAGNOSIS — R7309 Other abnormal glucose: Secondary | ICD-10-CM

## 2010-10-28 NOTE — Patient Instructions (Addendum)
For lower potassium.. Take multivitamin (One a day, centrum silver) and eat foods high in potassium.. Fruit and veggies.  Work on low carb diet (stop soda, tea, juice, decrease sweets, breads, pasta, potatos etc.), try to start exercise 3-5 days.   Stop at front desk to set up mammogram Try zyrtec or claritin for allergies.

## 2010-10-28 NOTE — Assessment & Plan Note (Signed)
Prediabetes. Work on low [employer].

## 2010-10-28 NOTE — Progress Notes (Signed)
Subjective:    Patient ID: Alexandria Petersen, female    DOB: 1940-12-26, 70 y.o.   MRN: [id number]  HPI  I have personally reviewed the Medicare Annual Wellness questionnaire and have noted 1. The patient's medical and social history 2. Their use of alcohol, tobacco or illicit drugs 3. Their current medications and supplements 4. The patient's functional ability including ADL's, fall risks, home safety risks and hearing or visual             impairment. 5. Diet and physical activities 6. Evidence for depression or mood disorders The patients weight, height, BMI and visual acuity have been recorded in the chart I have made referrals, counseling and provided education to the patient based review of the above and I have provided the pt with a written personalized care plan for preventive services.  Hypertension:  Well controlled on hyzaar.  Using medication without problems or lightheadedness:  Chest pain with exertion:None Edema:None Short of breath:None Average home BPs: not checking. Other issues:  Hypothyroid in past, well controlled off medication. Lab Results  Component Value Date   TSH 3.32 10/19/2010   Elevated Cholesterol: Great control on pravachol Using medications without problems:None Muscle aches: None Diet compliance:Good Exercise:None Other complaints:    Postnasal drip, scratchy throat.. Off and on in past few weeks... often after being outside. Not on any antihistamine.      Review of Systems  Constitutional: Negative for fever and fatigue.  HENT: Negative for ear pain.   Eyes: Negative for pain and itching.  Respiratory: Negative for cough, shortness of breath and wheezing.   Cardiovascular: Negative for chest pain, palpitations and leg swelling.  Gastrointestinal: Negative for abdominal pain, diarrhea, constipation and blood in stool.  Genitourinary: Negative for dysuria, hematuria, vaginal bleeding, vaginal discharge and difficulty urinating.    Psychiatric/Behavioral: Negative for dysphoric mood.       She does feel bad off and on if focuses on husband and sons death. Managing it well.       Objective:   Physical Exam  Constitutional: Vital signs are normal. She appears well-developed and well-nourished. She is cooperative.  Non-toxic appearance. She does not appear ill. No distress.  HENT:  Head: Normocephalic.  Right Ear: Hearing, tympanic membrane, external ear and ear canal normal.  Left Ear: Hearing, tympanic membrane, external ear and ear canal normal.  Nose: Mucosal edema and rhinorrhea present.  Eyes: Conjunctivae, EOM and lids are normal. Pupils are equal, round, and reactive to light. No foreign bodies found.  Neck: Trachea normal and normal range of motion. Neck supple. Carotid bruit is not present. No mass and no thyromegaly present.  Cardiovascular: Normal rate, regular rhythm, S1 normal, S2 normal, normal heart sounds and intact distal pulses.  Exam reveals no gallop.   No murmur heard. Pulmonary/Chest: Effort normal and breath sounds normal. No respiratory distress. She has no wheezes. She has no rhonchi. She has no rales.  Abdominal: Soft. Normal appearance and bowel sounds are normal. She exhibits no distension, no fluid wave, no abdominal bruit and no mass. There is no hepatosplenomegaly. There is no tenderness. There is no rebound, no guarding and no CVA tenderness. No hernia.  Genitourinary: No breast swelling, tenderness, discharge or bleeding. Pelvic exam was performed with patient prone.       No pap indicated, DVE not performed this year  Lymphadenopathy:    She has no cervical adenopathy.    She has no axillary adenopathy.  Neurological: She is alert. She  has normal strength. No cranial nerve deficit or sensory deficit.  Skin: Skin is warm, dry and intact. No rash noted.  Psychiatric: Her speech is normal and behavior is normal. Judgment normal. Her mood appears not anxious. Cognition and memory are  normal. She does not exhibit a depressed mood.          Assessment & Plan:  Annual Medicare Wellness: The patient's preventative maintenance and recommended screening tests for an annual wellness exam were reviewed in full today. Brought up to date unless services declined.  Counselled on the importance of diet, exercise, and its role in overall health and mortality. The patient's FH and SH was reviewed, including their home life, tobacco status, and drug and alcohol status.   Vaccines are up to date, flu given at daycare. DVE/PAP: no pap indicated. No family history of female cancer. No symptoms. Will plan DVE every other year, none this year. Mammogram due  Last DEXA 2010.. Nml. Yearly stool cards.. Given today.

## 2010-11-03 ENCOUNTER — Other Ambulatory Visit: Payer: Self-pay | Admitting: Family Medicine

## 2010-11-03 ENCOUNTER — Other Ambulatory Visit: Payer: Medicare Other

## 2010-11-03 DIAGNOSIS — Z1212 Encounter for screening for malignant neoplasm of rectum: Secondary | ICD-10-CM

## 2010-11-03 LAB — FECAL OCCULT BLOOD, IMMUNOCHEMICAL: Fecal Occult Bld: NEGATIVE

## 2010-11-14 DIAGNOSIS — J309 Allergic rhinitis, unspecified: Secondary | ICD-10-CM | POA: Insufficient documentation

## 2010-11-14 DIAGNOSIS — E876 Hypokalemia: Secondary | ICD-10-CM | POA: Insufficient documentation

## 2010-11-14 NOTE — Assessment & Plan Note (Signed)
At goal on pravastatin 

## 2010-11-14 NOTE — Assessment & Plan Note (Signed)
Lab Results  Component Value Date   TSH 3.32 10/19/2010   Well controlled. Continue current medication.

## 2010-11-14 NOTE — Assessment & Plan Note (Signed)
Well controlled. Continue current medication.  

## 2010-11-14 NOTE — Assessment & Plan Note (Signed)
Very mild. For lower potassium.. Take multivitamin (One a day, centrum silver) and eat foods high in potassium.. Fruit and veggies.

## 2010-11-14 NOTE — Assessment & Plan Note (Signed)
Try zyrtec or claritin for allergies

## 2011-01-10 LAB — HM DEXA SCAN: HM Dexa Scan: NORMAL

## 2011-01-12 ENCOUNTER — Ambulatory Visit: Payer: Medicare Other

## 2011-01-12 ENCOUNTER — Ambulatory Visit
Admission: RE | Admit: 2011-01-12 | Discharge: 2011-01-12 | Disposition: A | Discharge status: Home / Self Care | Payer: Medicare Other | Source: Ambulatory Visit | Attending: Family Medicine | Admitting: Family Medicine

## 2011-01-12 ENCOUNTER — Other Ambulatory Visit: Payer: Medicare Other

## 2011-01-12 DIAGNOSIS — Z780 Asymptomatic menopausal state: Secondary | ICD-10-CM

## 2011-01-12 DIAGNOSIS — Z1231 Encounter for screening mammogram for malignant neoplasm of breast: Secondary | ICD-10-CM

## 2011-01-16 ENCOUNTER — Encounter: Payer: Self-pay | Admitting: *Deleted

## 2011-04-17 ENCOUNTER — Other Ambulatory Visit: Payer: Self-pay | Admitting: *Deleted

## 2011-04-17 MED ORDER — LOSARTAN POTASSIUM-HCTZ 50-12.5 MG PO TABS: 1.0000 | ORAL_TABLET | Freq: Every day | ORAL | Status: DC

## 2011-04-17 MED ORDER — PRAVASTATIN SODIUM 40 MG PO TABS: 40.0000 mg | ORAL_TABLET | Freq: Every day | ORAL | Status: DC

## 2011-10-24 ENCOUNTER — Telehealth: Payer: Self-pay | Admitting: Family Medicine

## 2011-10-24 DIAGNOSIS — M899 Disorder of bone, unspecified: Secondary | ICD-10-CM

## 2011-10-24 DIAGNOSIS — E039 Hypothyroidism, unspecified: Secondary | ICD-10-CM

## 2011-10-24 DIAGNOSIS — M949 Disorder of cartilage, unspecified: Secondary | ICD-10-CM

## 2011-10-24 DIAGNOSIS — E7800 Pure hypercholesterolemia, unspecified: Secondary | ICD-10-CM

## 2011-10-24 NOTE — Telephone Encounter (Signed)
Message copied by Excell Seltzer on Tue Oct 24, 2011  4:57 PM ------      Message from: [name]      Created: Thu Oct 19, 2011 11:45 AM      Regarding: Lab orders for Thursday 10.17.13       Patient is scheduled for CPX labs, please order future labs, Thanks , Camelia Eng

## 2011-10-26 ENCOUNTER — Other Ambulatory Visit (INDEPENDENT_AMBULATORY_CARE_PROVIDER_SITE_OTHER): Payer: Medicare Other

## 2011-10-26 DIAGNOSIS — E7800 Pure hypercholesterolemia, unspecified: Secondary | ICD-10-CM

## 2011-10-26 DIAGNOSIS — E039 Hypothyroidism, unspecified: Secondary | ICD-10-CM

## 2011-10-26 DIAGNOSIS — M899 Disorder of bone, unspecified: Secondary | ICD-10-CM

## 2011-10-26 LAB — TSH: TSH: 3.63 u[IU]/mL (ref 0.35–5.50)

## 2011-10-26 LAB — COMPREHENSIVE METABOLIC PANEL
BUN: 17 mg/dL (ref 6–23)
CO2: 29 mEq/L (ref 19–32)
GFR: 72.41 mL/min (ref >60.00)
Glucose, Bld: 108 mg/dL — ABNORMAL HIGH (ref 70–99)
Sodium: 141 mEq/L (ref 135–145)
Total Bilirubin: 1.4 mg/dL — ABNORMAL HIGH (ref 0.3–1.2)
Total Protein: 7.7 g/dL (ref 6.0–8.3)

## 2011-10-26 LAB — LIPID PANEL
Total CHOL/HDL Ratio: 2
Triglycerides: 82 mg/dL (ref 0.0–149.0)

## 2011-10-27 LAB — VITAMIN D 25 HYDROXY (VIT D DEFICIENCY, FRACTURES): Vit D, 25-Hydroxy: 31 ng/mL (ref 30–89)

## 2011-11-02 ENCOUNTER — Encounter: Payer: Medicare Other | Admitting: Family Medicine

## 2011-11-03 ENCOUNTER — Ambulatory Visit (INDEPENDENT_AMBULATORY_CARE_PROVIDER_SITE_OTHER): Payer: Medicare Other | Admitting: Family Medicine

## 2011-11-03 ENCOUNTER — Encounter: Payer: Self-pay | Admitting: Family Medicine

## 2011-11-03 VITALS — BP 148/64 | HR 72 | Temp 98.0°F | Ht 62.0 in | Wt 151.0 lb

## 2011-11-03 DIAGNOSIS — Z1231 Encounter for screening mammogram for malignant neoplasm of breast: Secondary | ICD-10-CM

## 2011-11-03 DIAGNOSIS — E7800 Pure hypercholesterolemia, unspecified: Secondary | ICD-10-CM

## 2011-11-03 DIAGNOSIS — Z0000 Encounter for general adult medical examination without abnormal findings: Secondary | ICD-10-CM

## 2011-11-03 DIAGNOSIS — M949 Disorder of cartilage, unspecified: Secondary | ICD-10-CM

## 2011-11-03 DIAGNOSIS — I10 Essential (primary) hypertension: Secondary | ICD-10-CM

## 2011-11-03 DIAGNOSIS — E039 Hypothyroidism, unspecified: Secondary | ICD-10-CM

## 2011-11-03 DIAGNOSIS — R7303 Prediabetes: Secondary | ICD-10-CM

## 2011-11-03 DIAGNOSIS — Z1212 Encounter for screening for malignant neoplasm of rectum: Secondary | ICD-10-CM

## 2011-11-03 NOTE — Assessment & Plan Note (Signed)
Encouraged exercise, weight loss, healthy eating habits. Low carb diet. Increase fiber and protein.

## 2011-11-03 NOTE — Assessment & Plan Note (Signed)
Well controlled. Continue current medication.  

## 2011-11-03 NOTE — Progress Notes (Signed)
HPI  I have personally reviewed the Medicare Annual Wellness questionnaire and have noted  1. The patient's medical and social history  2. Their use of alcohol, tobacco or illicit drugs  3. Their current medications and supplements  4. The patient's functional ability including ADL's, fall risks, home safety risks and hearing or visual  impairment.  5. Diet and physical activities  6. Evidence for depression or mood disorders  The patients weight, height, BMI and visual acuity have been recorded in the chart  I have made referrals, counseling and provided education to the patient based review of the above and I have provided the pt with a written personalized care plan for preventive services.   Pain and stiffness in hand on right, she is right handed.Swelling off and on in 2nd MCP joint. Worse in past few weeks. Not using any med for it. Has been doing exercise.  Hypertension: Borderline control today ---- on hyzaar.  Using medication without problems or lightheadedness: None Chest pain with exertion:None  Edema:None  Short of breath:None  Average home BPs: not checking.  Other issues:   Hypothyroid in past, well controlled off medication.  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 Other complaints:    Review of Systems  Constitutional: Negative for fever and fatigue.  HENT: Negative for ear pain.  Eyes: Negative for pain and itching.  Respiratory: Negative for cough, shortness of breath and wheezing.  Cardiovascular: Negative for chest pain, palpitations and leg swelling.    Gastrointestinal: Negative for abdominal pain, diarrhea, constipation and blood in stool.  Genitourinary: Negative for dysuria, hematuria, vaginal bleeding, vaginal discharge and difficulty urinating.  Psychiatric/Behavioral: Negative for dysphoric mood.  No depression or anxiety, no SI.  MSK: hand and left knee pain off and on    Objective:   Physical Exam  Constitutional: Vital signs are normal. She appears well-developed and well-nourished. She is cooperative. Non-toxic appearance. She does not appear ill. No distress.  HENT:  Head: Normocephalic.  Right Ear: Hearing, tympanic membrane, external ear and ear canal normal.  Left Ear: Hearing, tympanic membrane, external ear and ear canal normal.  Nose: Mucosal edema and rhinorrhea present.  Eyes: Conjunctivae, EOM and lids are normal. Pupils are equal, round, and reactive to light. No foreign bodies found.  Neck: Trachea normal and normal range of motion. Neck supple. Carotid bruit is not present. No mass and no thyromegaly present.  Cardiovascular: Normal rate, regular rhythm, S1 normal, S2 normal, normal heart sounds and intact distal pulses. Exam reveals no gallop.  No murmur heard.  Pulmonary/Chest: Effort normal and breath sounds normal. No respiratory distress. She has no wheezes. She has no rhonchi. She has no rales.  Abdominal: Soft. Normal appearance and bowel sounds are normal. She exhibits no distension, no fluid wave, no abdominal bruit and no mass. There is no hepatosplenomegaly. There is no tenderness. There is no rebound, no guarding and no CVA tenderness. No hernia.  Genitourinary: No breast swelling, tenderness, discharge or bleeding. Pelvic exam was performed with patient supine.  No pap indicated, DVE  performed  this year , no external vaginal changes, no uterine mass, no adenexal tenderness or mass. Lymphadenopathy:  She has no cervical adenopathy.  She has no axillary adenopathy.  Neurological: She is alert. She has  normal strength. No cranial nerve deficit or sensory deficit.  Skin: Skin is warm, dry and intact. No rash noted.  Psychiatric: Her speech is normal and behavior is normal. Judgment normal. Her mood appears not anxious. Cognition and memory are normal. She does not exhibit a depressed mood.    Assessment & Plan:   Annual Medicare Wellness: The patient's preventative maintenance and recommended screening tests for an annual wellness exam were reviewed in full today.  Brought up to date unless services declined.  Counselled on the importance of diet, exercise, and its role in overall health and mortality.  The patient's FH and SH was reviewed, including their home life, tobacco status, and drug and alcohol status.   Vaccines are up to date, flu given at daycare.  DVE/PAP: no pap indicated. No family history of female cancer. No symptoms. Will plan DVE every other year, none this year.  Mammogram due  Last DEXA 2012.. Nml.  Yearly stool cards.. Given today.

## 2011-11-03 NOTE — Assessment & Plan Note (Signed)
Follow BP at home call if greater than 140/90

## 2011-11-03 NOTE — Patient Instructions (Addendum)
Check blood pressure at home.. Call if greater than or equal to 140/90. Try to increase exercise such as walking 3 times a week.  Work on lowering carbohydrates in diet. Stop soda or juice. Decrease pasta, bread, potatos.. If do have these make sure they are whole wheat.  Stop at front desk to set up mammogram. Stop at lab to set up stool test.

## 2011-11-13 ENCOUNTER — Encounter: Payer: Self-pay | Admitting: Family Medicine

## 2011-11-13 ENCOUNTER — Other Ambulatory Visit (INDEPENDENT_AMBULATORY_CARE_PROVIDER_SITE_OTHER): Payer: Medicare Other

## 2011-11-13 DIAGNOSIS — Z1212 Encounter for screening for malignant neoplasm of rectum: Secondary | ICD-10-CM

## 2011-11-13 LAB — FECAL OCCULT BLOOD, IMMUNOCHEMICAL: Fecal Occult Bld: NEGATIVE

## 2012-01-06 ENCOUNTER — Other Ambulatory Visit: Payer: Self-pay | Admitting: Family Medicine

## 2012-01-15 ENCOUNTER — Ambulatory Visit
Admission: RE | Admit: 2012-01-15 | Discharge: 2012-01-15 | Disposition: A | Discharge status: Home / Self Care | Payer: Medicare Other | Source: Ambulatory Visit | Attending: Family Medicine | Admitting: Family Medicine

## 2012-01-15 DIAGNOSIS — Z1231 Encounter for screening mammogram for malignant neoplasm of breast: Secondary | ICD-10-CM

## 2012-04-08 ENCOUNTER — Other Ambulatory Visit: Payer: Self-pay | Admitting: Family Medicine

## 2012-04-12 ENCOUNTER — Other Ambulatory Visit: Payer: Self-pay | Admitting: Family Medicine

## 2012-10-28 ENCOUNTER — Telehealth: Payer: Self-pay | Admitting: Family Medicine

## 2012-10-28 DIAGNOSIS — E7800 Pure hypercholesterolemia, unspecified: Secondary | ICD-10-CM

## 2012-10-28 DIAGNOSIS — E876 Hypokalemia: Secondary | ICD-10-CM

## 2012-10-28 DIAGNOSIS — M899 Disorder of bone, unspecified: Secondary | ICD-10-CM

## 2012-10-28 DIAGNOSIS — E039 Hypothyroidism, unspecified: Secondary | ICD-10-CM

## 2012-10-28 DIAGNOSIS — I10 Essential (primary) hypertension: Secondary | ICD-10-CM

## 2012-10-28 NOTE — Telephone Encounter (Signed)
Message copied by Excell Seltzer on Mon Oct 28, 2012 10:33 PM ------      Message from: [name]      Created: Fri Oct 18, 2012  4:06 PM      Regarding: Lab orders for Tuesday, 10.21.14       Patient is scheduled for CPX labs, please order future labs, Thanks , Alexandria Petersen       ------

## 2012-10-29 ENCOUNTER — Other Ambulatory Visit (INDEPENDENT_AMBULATORY_CARE_PROVIDER_SITE_OTHER): Payer: Medicare Other

## 2012-10-29 DIAGNOSIS — E039 Hypothyroidism, unspecified: Secondary | ICD-10-CM

## 2012-10-29 DIAGNOSIS — E7800 Pure hypercholesterolemia, unspecified: Secondary | ICD-10-CM

## 2012-10-29 DIAGNOSIS — M899 Disorder of bone, unspecified: Secondary | ICD-10-CM

## 2012-10-29 LAB — LIPID PANEL
HDL: 96.9 mg/dL (ref >39.00)
Total CHOL/HDL Ratio: 2
VLDL: 19.4 mg/dL (ref 0.0–40.0)

## 2012-10-29 LAB — COMPREHENSIVE METABOLIC PANEL
ALT: 19 U/L (ref 0–35)
AST: 26 U/L (ref 0–37)
Creatinine, Ser: 0.9 mg/dL (ref 0.4–1.2)
Total Bilirubin: 1.5 mg/dL — ABNORMAL HIGH (ref 0.3–1.2)

## 2012-10-29 LAB — LDL CHOLESTEROL, DIRECT: Direct LDL: 90.7 mg/dL

## 2012-10-29 LAB — TSH: TSH: 4.01 u[IU]/mL (ref 0.35–5.50)

## 2012-10-30 LAB — VITAMIN D 25 HYDROXY (VIT D DEFICIENCY, FRACTURES): Vit D, 25-Hydroxy: 49 ng/mL (ref 30–89)

## 2012-11-05 ENCOUNTER — Other Ambulatory Visit: Payer: Self-pay

## 2012-11-05 ENCOUNTER — Telehealth: Payer: Self-pay

## 2012-11-05 ENCOUNTER — Encounter: Payer: Self-pay | Admitting: Family Medicine

## 2012-11-05 ENCOUNTER — Ambulatory Visit (INDEPENDENT_AMBULATORY_CARE_PROVIDER_SITE_OTHER): Payer: Medicare Other | Admitting: Family Medicine

## 2012-11-05 VITALS — BP 150/80 | HR 75 | Temp 97.9°F | Ht 62.0 in | Wt 158.8 lb

## 2012-11-05 DIAGNOSIS — I10 Essential (primary) hypertension: Secondary | ICD-10-CM

## 2012-11-05 DIAGNOSIS — E7800 Pure hypercholesterolemia, unspecified: Secondary | ICD-10-CM

## 2012-11-05 DIAGNOSIS — Z0000 Encounter for general adult medical examination without abnormal findings: Secondary | ICD-10-CM

## 2012-11-05 DIAGNOSIS — E039 Hypothyroidism, unspecified: Secondary | ICD-10-CM

## 2012-11-05 DIAGNOSIS — Z23 Encounter for immunization: Secondary | ICD-10-CM

## 2012-11-05 DIAGNOSIS — R7309 Other abnormal glucose: Secondary | ICD-10-CM

## 2012-11-05 DIAGNOSIS — M899 Disorder of bone, unspecified: Secondary | ICD-10-CM

## 2012-11-05 DIAGNOSIS — Z1212 Encounter for screening for malignant neoplasm of rectum: Secondary | ICD-10-CM

## 2012-11-05 DIAGNOSIS — R7303 Prediabetes: Secondary | ICD-10-CM

## 2012-11-05 DIAGNOSIS — Z1231 Encounter for screening mammogram for malignant neoplasm of breast: Secondary | ICD-10-CM

## 2012-11-05 NOTE — Assessment & Plan Note (Signed)
Work on low [employer]. Increase exercsie.  Info given.

## 2012-11-05 NOTE — Telephone Encounter (Signed)
Updated on Medication List.

## 2012-11-05 NOTE — Assessment & Plan Note (Signed)
Well controlled. Continue current medication.  

## 2012-11-05 NOTE — Progress Notes (Signed)
HPI  I have personally reviewed the Medicare Annual Wellness questionnaire and have noted  1. The patient's medical and social history  2. Their use of alcohol, tobacco or illicit drugs  3. Their current medications and supplements  4. The patient's functional ability including ADL's, fall risks, home safety risks and hearing or visual  impairment.  5. Diet and physical activities  6. Evidence for depression or mood disorders  The patients weight, height, BMI and visual acuity have been recorded in the chart  I have made referrals, counseling and provided education to the patient based review of the above and I have provided the pt with a written personalized care plan for preventive services.   Hypertension: Borderline control today, but at home well controlled ---- on hyzaar.  Using medication without problems or lightheadedness: None  Chest pain with exertion:None  Edema:None  Short of breath:None  Average home BPs: 117-131/63-68 Other issues:  BP Readings from Last 3 Encounters:  11/05/12 150/80  11/03/11 148/64  10/28/10 130/70     Hypothyroid in past, well controlled off medication.  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 Other complaints:   Review of Systems  Constitutional: Negative for fever and fatigue.  HENT: Negative for ear pain.  Eyes: Negative for pain and itching.  Respiratory: Negative for cough, shortness of breath and wheezing.  Cardiovascular: Negative for chest pain, palpitations and leg swelling.  Gastrointestinal: Negative for abdominal pain, diarrhea, constipation and blood in  stool.  Genitourinary: Negative for dysuria, hematuria, vaginal bleeding, vaginal discharge and difficulty urinating.  Psychiatric/Behavioral: Negative for dysphoric mood. No depression or anxiety, no SI.  MSK: hand and left knee pain off and on  Objective:   Physical Exam  Constitutional: Vital signs are normal. She appears well-developed and well-nourished. She is cooperative. Non-toxic appearance. She does not appear ill. No distress.  HENT:  Head: Normocephalic.  Right Ear: Hearing, tympanic membrane, external ear and ear canal normal.  Left Ear: Hearing, tympanic membrane, external ear and ear canal normal.  Nose: Mucosal edema and rhinorrhea present.  Eyes: Conjunctivae, EOM and lids are normal. Pupils are equal, round, and reactive to light. No foreign bodies found.  Neck: Trachea normal and normal range of motion. Neck supple. Carotid bruit is not present. No mass and no thyromegaly present.  Cardiovascular: Normal rate, regular rhythm, S1 normal, S2 normal, normal heart sounds and intact distal pulses. Exam reveals no gallop.  No murmur heard.  Pulmonary/Chest: Effort normal and breath sounds normal. No respiratory distress. She has no wheezes. She has no rhonchi. She has no rales.  Abdominal: Soft. Normal appearance and bowel sounds are normal. She exhibits no distension, no fluid wave, no abdominal bruit and no mass. There is no hepatosplenomegaly. There is no tenderness. There is no rebound, no guarding and no CVA tenderness. No hernia.  Genitourinary: No breast swelling, tenderness, discharge or bleeding. Pelvic exam was performed with patient supine.  No pap indicated, DVE  NOT performed this year , no external vaginal changes, no uterine mass, no adenexal tenderness or mass. Lymphadenopathy:  She  has no cervical adenopathy.  She has no axillary adenopathy.  Neurological: She is alert. She has normal strength. No cranial nerve deficit or sensory deficit.  Skin: Skin is warm, dry  and intact. No rash noted.  Psychiatric: Her speech is normal and behavior is normal. Judgment normal. Her mood appears not anxious. Cognition and memory are normal. She does not exhibit a depressed mood.  Assessment & Plan:   Annual Medicare Wellness: The patient's preventative maintenance and recommended screening tests for an annual wellness exam were reviewed in full today.  Brought up to date unless services declined.  Counselled on the importance of diet, exercise, and its role in overall health and mortality.  The patient's FH and SH was reviewed, including their home life, tobacco status, and drug and alcohol status.   Vaccines are up to date, flu given . DVE/PAP: no pap indicated. No family history of female cancer. No symptoms. Will plan DVE every other year, none this year.  Mammogram due in 01/2012 Last DEXA 2012.. Nml.  Repeat in 5 years. Yearly stool cards.. Given today.

## 2012-11-05 NOTE — Patient Instructions (Addendum)
Work on regular exercise. Follow BP at home.. Check your cuff against another one. Decrease carbohydrates, sweetened beverages. Increase protein and fiber. Choose fruit with peel, no applesauce, fruit cocktail. Good snacks: skim milk moz cheese, handful of almonds. Eggs okay. Call on your own to schedule mammogram.  Pick up stool test in lab on way out.

## 2012-11-05 NOTE — Assessment & Plan Note (Signed)
Stable off medication.

## 2012-11-05 NOTE — Telephone Encounter (Signed)
Pt saw Dr Ermalene Searing this AM and was to call back with further info on fish oil pill; pt is presently taking fish oil 1200 mg daily which also has Vit D 3 2000 iu.

## 2012-11-05 NOTE — Addendum Note (Signed)
Addended by: Damita Lack on: 11/05/2012 03:44 PM   Modules accepted: Orders

## 2012-11-05 NOTE — Assessment & Plan Note (Signed)
Well controlled. Continue current medication. Encouraged exercise, weight loss, healthy eating habits.  

## 2012-11-05 NOTE — Assessment & Plan Note (Signed)
Vit D nml, repeat DEXA in 2017.

## 2012-11-12 ENCOUNTER — Other Ambulatory Visit (INDEPENDENT_AMBULATORY_CARE_PROVIDER_SITE_OTHER): Payer: Medicare Other

## 2012-11-12 DIAGNOSIS — Z1212 Encounter for screening for malignant neoplasm of rectum: Secondary | ICD-10-CM

## 2012-11-12 LAB — FECAL OCCULT BLOOD, IMMUNOCHEMICAL: Fecal Occult Bld: NEGATIVE

## 2012-11-13 ENCOUNTER — Encounter: Payer: Self-pay | Admitting: Family Medicine

## 2013-01-15 ENCOUNTER — Ambulatory Visit
Admission: RE | Admit: 2013-01-15 | Discharge: 2013-01-15 | Disposition: A | Discharge status: Home / Self Care | Payer: Medicare Other | Source: Ambulatory Visit

## 2013-01-15 DIAGNOSIS — Z1231 Encounter for screening mammogram for malignant neoplasm of breast: Secondary | ICD-10-CM

## 2013-03-30 ENCOUNTER — Other Ambulatory Visit: Payer: Self-pay | Admitting: Family Medicine

## 2013-06-26 ENCOUNTER — Other Ambulatory Visit: Payer: Self-pay | Admitting: Family Medicine

## 2013-07-18 ENCOUNTER — Ambulatory Visit (INDEPENDENT_AMBULATORY_CARE_PROVIDER_SITE_OTHER): Payer: Medicare Other | Admitting: Family Medicine

## 2013-07-18 ENCOUNTER — Encounter: Payer: Self-pay | Admitting: Family Medicine

## 2013-07-18 VITALS — BP 140/62 | HR 82 | Temp 98.3°F | Ht 62.0 in | Wt 153.8 lb

## 2013-07-18 DIAGNOSIS — J069 Acute upper respiratory infection, unspecified: Secondary | ICD-10-CM

## 2013-07-18 DIAGNOSIS — B9789 Other viral agents as the cause of diseases classified elsewhere: Principal | ICD-10-CM

## 2013-07-18 MED ORDER — GUAIFENESIN-CODEINE 100-10 MG/5ML PO SYRP: 5.0000 mL | ORAL_SOLUTION | Freq: Every evening | ORAL | Status: DC | PRN

## 2013-07-18 NOTE — Progress Notes (Signed)
Pre visit review using our clinic review tool, if applicable. No additional management support is needed unless otherwise documented below in the visit note. 

## 2013-07-18 NOTE — Patient Instructions (Signed)
Mucinex Dm during the day. Prescription cough supressant at night. Start nasal saline spray in nose if congestion worsening. Call if shortness of breath, fever late in illness and one sided face pain. Call if not improving some past 7-10 days, call.

## 2013-07-18 NOTE — Assessment & Plan Note (Signed)
No sign of bacterial infection. Symptomatic care. Start cough supressant at night.

## 2013-07-18 NOTE — Progress Notes (Signed)
   Subjective:    Patient ID: Alexandria Petersen, female    DOB: [birth year], 73 y.o.   MRN: [id number]  Cough This is a new problem. The current episode started in the past 7 days. The cough is productive of sputum. Associated symptoms include headaches, myalgias, nasal congestion and a sore throat. Pertinent negatives include no ear congestion, ear pain, fever, hemoptysis, rash, shortness of breath or wheezing. Associated symptoms comments: Hoarse , sinus pressure upper abdominal pain after coughing fits.. The symptoms are aggravated by lying down (keeping her up at night). Risk factors: non smoker. Treatments tried: using coricidin. The treatment provided mild relief. Her past medical history is significant for environmental allergies. There is no history of asthma, bronchiectasis, bronchitis, COPD, emphysema or pneumonia.      Review of Systems  Constitutional: Negative for fever.  HENT: Positive for sore throat. Negative for ear pain.   Respiratory: Positive for cough. Negative for hemoptysis, shortness of breath and wheezing.   Musculoskeletal: Positive for myalgias.  Skin: Negative for rash.  Allergic/Immunologic: Positive for environmental allergies.  Neurological: Positive for headaches.       Objective:   Physical Exam  Constitutional: Vital signs are normal. She appears well-developed and well-nourished. She is cooperative.  Non-toxic appearance. She does not appear ill. No distress.  HENT:  Head: Normocephalic.  Right Ear: Hearing, tympanic membrane, external ear and ear canal normal. Tympanic membrane is not erythematous, not retracted and not bulging.  Left Ear: Hearing, tympanic membrane, external ear and ear canal normal. Tympanic membrane is not erythematous, not retracted and not bulging.  Nose: Mucosal edema and rhinorrhea present. Right sinus exhibits no maxillary sinus tenderness and no frontal sinus tenderness. Left sinus exhibits no maxillary sinus tenderness and no  frontal sinus tenderness.  Mouth/Throat: Uvula is midline, oropharynx is clear and moist and mucous membranes are normal.  Eyes: Conjunctivae, EOM and lids are normal. Pupils are equal, round, and reactive to light. Lids are everted and swept, no foreign bodies found.  Neck: Trachea normal and normal range of motion. Neck supple. Carotid bruit is not present. No mass and no thyromegaly present.  Cardiovascular: Normal rate, regular rhythm, S1 normal, S2 normal, normal heart sounds, intact distal pulses and normal pulses.  Exam reveals no gallop and no friction rub.   No murmur heard. Pulmonary/Chest: Effort normal and breath sounds normal. Not tachypneic. No respiratory distress. She has no decreased breath sounds. She has no wheezes. She has no rhonchi. She has no rales.  Neurological: She is alert.  Skin: Skin is warm, dry and intact. No rash noted.  Psychiatric: Her speech is normal and behavior is normal. Judgment normal. Her mood appears not anxious. Cognition and memory are normal. She does not exhibit a depressed mood.          Assessment & Plan:

## 2013-09-22 ENCOUNTER — Other Ambulatory Visit: Payer: Self-pay | Admitting: Family Medicine

## 2013-10-31 ENCOUNTER — Other Ambulatory Visit (INDEPENDENT_AMBULATORY_CARE_PROVIDER_SITE_OTHER): Payer: Medicare Other

## 2013-10-31 ENCOUNTER — Telehealth: Payer: Self-pay | Admitting: Family Medicine

## 2013-10-31 DIAGNOSIS — R7303 Prediabetes: Secondary | ICD-10-CM

## 2013-10-31 DIAGNOSIS — M8580 Other specified disorders of bone density and structure, unspecified site: Secondary | ICD-10-CM

## 2013-10-31 DIAGNOSIS — R7309 Other abnormal glucose: Secondary | ICD-10-CM

## 2013-10-31 DIAGNOSIS — E7800 Pure hypercholesterolemia, unspecified: Secondary | ICD-10-CM

## 2013-10-31 DIAGNOSIS — E038 Other specified hypothyroidism: Secondary | ICD-10-CM

## 2013-10-31 DIAGNOSIS — M859 Disorder of bone density and structure, unspecified: Secondary | ICD-10-CM

## 2013-10-31 LAB — COMPREHENSIVE METABOLIC PANEL
ALT: 20 U/L (ref 0–35)
AST: 24 U/L (ref 0–37)
Albumin: 3.7 g/dL (ref 3.5–5.2)
Alkaline Phosphatase: 42 U/L (ref 39–117)
BUN: 15 mg/dL (ref 6–23)
CALCIUM: 9.8 mg/dL (ref 8.4–10.5)
CO2: 24 meq/L (ref 19–32)
Chloride: 103 mEq/L (ref 96–112)
Creatinine, Ser: 0.9 mg/dL (ref 0.4–1.2)
GFR: 75.6 mL/min (ref >60.00)
GLUCOSE: 90 mg/dL (ref 70–99)
Potassium: 3.4 mEq/L — ABNORMAL LOW (ref 3.5–5.1)
SODIUM: 138 meq/L (ref 135–145)
TOTAL PROTEIN: 7.7 g/dL (ref 6.0–8.3)
Total Bilirubin: 1.3 mg/dL — ABNORMAL HIGH (ref 0.2–1.2)

## 2013-10-31 LAB — HEMOGLOBIN A1C: HEMOGLOBIN A1C: 5.9 % (ref 4.6–6.5)

## 2013-10-31 LAB — LIPID PANEL
Cholesterol: 194 mg/dL (ref 0–200)
HDL: 91.3 mg/dL (ref >39.00)
LDL Cholesterol: 88 mg/dL (ref 0–99)
NonHDL: 102.7
Total CHOL/HDL Ratio: 2
Triglycerides: 72 mg/dL (ref 0.0–149.0)
VLDL: 14.4 mg/dL (ref 0.0–40.0)

## 2013-10-31 LAB — VITAMIN D 25 HYDROXY (VIT D DEFICIENCY, FRACTURES): VITD: 49.55 ng/mL (ref 30.00–100.00)

## 2013-10-31 LAB — TSH: TSH: 0.23 u[IU]/mL — AB (ref 0.35–4.50)

## 2013-10-31 NOTE — Telephone Encounter (Signed)
Message copied by Jinny Sanders on Fri Oct 31, 2013  8:27 AM ------      Message from: [name]      Created: Mon Oct 27, 2013  2:41 PM      Regarding: Lab orders for Friday, 10.23.15       Patient is scheduled for CPX labs, please order future labs, Thanks , Alexandria Petersen       ------

## 2013-11-07 ENCOUNTER — Encounter: Payer: Self-pay | Admitting: Family Medicine

## 2013-11-07 ENCOUNTER — Ambulatory Visit (INDEPENDENT_AMBULATORY_CARE_PROVIDER_SITE_OTHER): Payer: Medicare Other | Admitting: Family Medicine

## 2013-11-07 VITALS — BP 128/60 | HR 74 | Temp 97.9°F | Ht 62.0 in | Wt 149.5 lb

## 2013-11-07 DIAGNOSIS — M170 Bilateral primary osteoarthritis of knee: Secondary | ICD-10-CM | POA: Insufficient documentation

## 2013-11-07 DIAGNOSIS — E7800 Pure hypercholesterolemia, unspecified: Secondary | ICD-10-CM

## 2013-11-07 DIAGNOSIS — Z1211 Encounter for screening for malignant neoplasm of colon: Secondary | ICD-10-CM

## 2013-11-07 DIAGNOSIS — E038 Other specified hypothyroidism: Secondary | ICD-10-CM

## 2013-11-07 DIAGNOSIS — Z0000 Encounter for general adult medical examination without abnormal findings: Secondary | ICD-10-CM

## 2013-11-07 DIAGNOSIS — I10 Essential (primary) hypertension: Secondary | ICD-10-CM

## 2013-11-07 LAB — T4, FREE: Free T4: 1.26 ng/dL (ref 0.60–1.60)

## 2013-11-07 LAB — TSH: TSH: 0.21 u[IU]/mL — ABNORMAL LOW (ref 0.35–4.50)

## 2013-11-07 LAB — T3, FREE: T3, Free: 3.3 pg/mL (ref 2.3–4.2)

## 2013-11-07 NOTE — Patient Instructions (Addendum)
Trial of glucosamine 500 mg 1-3 a day. Can use occ aleve or ibuprofen for pain.  Stop at lab on way out to pick up  stool test and thyroid test.

## 2013-11-07 NOTE — Assessment & Plan Note (Signed)
Well controlled. Continue current medication.  

## 2013-11-07 NOTE — Progress Notes (Signed)
HPI  I have personally reviewed the Medicare Annual Wellness questionnaire and have noted  1. The patient's medical and social history  2. Their use of alcohol, tobacco or illicit drugs  3. Their current medications and supplements  4. The patient's functional ability including ADL's, fall risks, home safety risks and hearing or visual  impairment.  5. Diet and physical activities  6. Evidence for depression or mood disorders  The patients weight, height, BMI and visual acuity have been recorded in the chart  I have made referrals, counseling and provided education to the patient based review of the above and I have provided the pt with a written personalized care plan for preventive services.   She has been having pain in B knees, worse when it gets cold. Left greater right. Stiffness after sitting a while. Has not tried any ,med to treat.  No clicking or popping or weakness, no falls/injury  Prediabetes: well controlled. Lab Results  Component Value Date   HGBA1C 5.9 10/31/2013     Hypertension:Well controlled on hyzaar.  BP Readings from Last 3 Encounters:  11/07/13 128/60  07/18/13 140/62  11/05/12 150/80  Using medication without problems or lightheadedness: None  Chest pain with exertion:None  Edema:None  Short of breath:None  Average home BPs: 117-131/63-68  Other issues:   Hypothyroid in past,  off medication.  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  Other complaints:   Review of Systems  Constitutional: Negative for fever and fatigue.  HENT: Negative for ear pain.  Eyes: Negative for pain  and itching.  Respiratory: Negative for cough, shortness of breath and wheezing.  Cardiovascular: Negative for chest pain, palpitations and leg swelling.  Gastrointestinal: Negative for abdominal pain, diarrhea, constipation and blood in stool.  Genitourinary: Negative for dysuria, hematuria, vaginal bleeding, vaginal discharge and difficulty urinating.  Psychiatric/Behavioral: Negative for dysphoric mood. No depression or anxiety, no SI.  MSK: hand and left knee pain off and on  Objective:   Physical Exam  Constitutional: Vital signs are normal. She appears well-developed and well-nourished. She is cooperative. Non-toxic appearance. She does not appear ill. No distress.  HENT:  Head: Normocephalic.  Right Ear: Hearing, tympanic membrane, external ear and ear canal normal.  Left Ear: Hearing, tympanic membrane, external ear and ear canal normal.  Nose: Mucosal edema and rhinorrhea present.  Eyes: Conjunctivae, EOM and lids are normal. Pupils are equal, round, and reactive to light. No foreign bodies found.  Neck: Trachea normal and normal range of motion. Neck supple. Carotid bruit is not present. No mass and no thyromegaly present.  Cardiovascular: Normal rate, regular rhythm, S1 normal, S2 normal, normal heart sounds and intact distal pulses. Exam reveals no gallop.  No murmur heard.  Pulmonary/Chest: Effort normal and breath sounds normal. No respiratory distress. She has no wheezes. She has no rhonchi. She has no rales.  Abdominal: Soft. Normal appearance and bowel sounds are normal. She exhibits no distension, no fluid wave, no abdominal bruit and no mass. There is no hepatosplenomegaly. There is no tenderness. There is no rebound, no guarding and no  CVA tenderness. No hernia.  Genitourinary: No breast swelling, tenderness, discharge or bleeding. Pelvic exam was performed with patient supine.  No pap indicated, DVE performed this year , no external vaginal changes, no uterine mass, no  adenexal tenderness or mass. Lymphadenopathy:  She has no cervical adenopathy.  She has no axillary adenopathy.  Neurological: She is alert. She has normal strength. No cranial nerve deficit or sensory deficit.  Skin: Skin is warm, dry and intact. No rash noted.  Psychiatric: Her speech is normal and behavior is normal. Judgment normal. Her mood appears not anxious. Cognition and memory are normal. She does not exhibit a depressed mood.  Assessment & Plan:   Annual Medicare Wellness: The patient's preventative maintenance and recommended screening tests for an annual wellness exam were reviewed in full today.  Brought up to date unless services declined.  Counselled on the importance of diet, exercise, and its role in overall health and mortality.  The patient's FH and SH was reviewed, including their home life, tobacco status, and drug and alcohol status.   Vaccines are up to date, flu at work, will return for prevnar.  DVE/PAP: no pap indicated. No family history of female cancer. No symptoms. Will plan DVE every other year, check this year.  Mammogram due in 01/2014 Last DEXA 2013.. Nml. Repeat in 5 years.  Yearly stool cards.. Given today.

## 2013-11-07 NOTE — Assessment & Plan Note (Signed)
Glucosamine, NSAID prn. [employer] walking as able.

## 2013-11-07 NOTE — Progress Notes (Signed)
Pre visit review using our clinic review tool, if applicable. No additional management support is needed unless otherwise documented below in the visit note. 

## 2013-11-10 ENCOUNTER — Telehealth: Payer: Self-pay | Admitting: Family Medicine

## 2013-11-10 NOTE — Telephone Encounter (Signed)
emmi mailed  °

## 2013-11-13 ENCOUNTER — Other Ambulatory Visit (INDEPENDENT_AMBULATORY_CARE_PROVIDER_SITE_OTHER): Payer: Medicare Other

## 2013-11-13 DIAGNOSIS — Z1211 Encounter for screening for malignant neoplasm of colon: Secondary | ICD-10-CM

## 2013-11-14 ENCOUNTER — Other Ambulatory Visit: Payer: Self-pay

## 2013-11-14 ENCOUNTER — Encounter: Payer: Self-pay | Admitting: *Deleted

## 2013-11-14 DIAGNOSIS — Z1231 Encounter for screening mammogram for malignant neoplasm of breast: Secondary | ICD-10-CM

## 2013-11-14 LAB — FECAL OCCULT BLOOD, IMMUNOCHEMICAL: FECAL OCCULT BLD: NEGATIVE

## 2013-12-18 ENCOUNTER — Other Ambulatory Visit: Payer: Self-pay | Admitting: Family Medicine

## 2013-12-19 ENCOUNTER — Other Ambulatory Visit: Payer: Self-pay | Admitting: Family Medicine

## 2014-01-19 ENCOUNTER — Ambulatory Visit
Admission: RE | Admit: 2014-01-19 | Discharge: 2014-01-19 | Disposition: A | Discharge status: Home / Self Care | Payer: Medicare Other | Source: Ambulatory Visit

## 2014-01-19 DIAGNOSIS — Z1231 Encounter for screening mammogram for malignant neoplasm of breast: Secondary | ICD-10-CM

## 2014-02-25 DIAGNOSIS — H2513 Age-related nuclear cataract, bilateral: Secondary | ICD-10-CM | POA: Diagnosis not present

## 2014-04-21 ENCOUNTER — Ambulatory Visit (INDEPENDENT_AMBULATORY_CARE_PROVIDER_SITE_OTHER): Payer: Medicare Other | Admitting: *Deleted

## 2014-04-21 ENCOUNTER — Encounter: Payer: Self-pay | Admitting: *Deleted

## 2014-04-21 DIAGNOSIS — Z111 Encounter for screening for respiratory tuberculosis: Secondary | ICD-10-CM | POA: Diagnosis not present

## 2014-04-23 ENCOUNTER — Encounter: Payer: Self-pay | Admitting: *Deleted

## 2014-04-23 LAB — TB SKIN TEST
INDURATION: 0 mm
TB Skin Test: NEGATIVE

## 2014-06-12 ENCOUNTER — Other Ambulatory Visit: Payer: Self-pay | Admitting: Family Medicine

## 2014-07-10 DIAGNOSIS — Z7689 Persons encountering health services in other specified circumstances: Secondary | ICD-10-CM

## 2014-07-16 ENCOUNTER — Telehealth: Payer: Self-pay | Admitting: Family Medicine

## 2014-07-16 NOTE — Telephone Encounter (Signed)
Last CPE & PPD printed and handed to patient while in office.

## 2014-07-16 NOTE — Telephone Encounter (Signed)
Pt needs copy of physical and copy of tb test for a new job, please call [phone] when ready to be picked up thanks;

## 2014-10-30 ENCOUNTER — Ambulatory Visit (INDEPENDENT_AMBULATORY_CARE_PROVIDER_SITE_OTHER): Payer: Medicare Other

## 2014-10-30 DIAGNOSIS — Z23 Encounter for immunization: Secondary | ICD-10-CM

## 2014-11-30 ENCOUNTER — Other Ambulatory Visit (INDEPENDENT_AMBULATORY_CARE_PROVIDER_SITE_OTHER): Payer: Medicare Other

## 2014-11-30 DIAGNOSIS — I10 Essential (primary) hypertension: Secondary | ICD-10-CM

## 2014-11-30 DIAGNOSIS — E559 Vitamin D deficiency, unspecified: Secondary | ICD-10-CM | POA: Diagnosis not present

## 2014-11-30 DIAGNOSIS — E039 Hypothyroidism, unspecified: Secondary | ICD-10-CM | POA: Diagnosis not present

## 2014-11-30 DIAGNOSIS — E7800 Pure hypercholesterolemia, unspecified: Secondary | ICD-10-CM

## 2014-11-30 LAB — COMPREHENSIVE METABOLIC PANEL
ALBUMIN: 4 g/dL (ref 3.5–5.2)
ALK PHOS: 50 U/L (ref 39–117)
ALT: 25 U/L (ref 0–35)
AST: 20 U/L (ref 0–37)
BILIRUBIN TOTAL: 1.2 mg/dL (ref 0.2–1.2)
BUN: 16 mg/dL (ref 6–23)
CALCIUM: 10.1 mg/dL (ref 8.4–10.5)
CHLORIDE: 104 meq/L (ref 96–112)
CO2: 29 mEq/L (ref 19–32)
CREATININE: 0.66 mg/dL (ref 0.40–1.20)
GFR: 111.98 mL/min (ref >60.00)
Glucose, Bld: 104 mg/dL — ABNORMAL HIGH (ref 70–99)
Potassium: 4 mEq/L (ref 3.5–5.1)
SODIUM: 141 meq/L (ref 135–145)
TOTAL PROTEIN: 6.9 g/dL (ref 6.0–8.3)

## 2014-11-30 LAB — LIPID PANEL
CHOLESTEROL: 182 mg/dL (ref 0–200)
HDL: 87.4 mg/dL (ref >39.00)
LDL CALC: 74 mg/dL (ref 0–99)
NonHDL: 94.16
TRIGLYCERIDES: 103 mg/dL (ref 0.0–149.0)
Total CHOL/HDL Ratio: 2
VLDL: 20.6 mg/dL (ref 0.0–40.0)

## 2014-11-30 LAB — VITAMIN D 25 HYDROXY (VIT D DEFICIENCY, FRACTURES): VITD: 36.18 ng/mL (ref 30.00–100.00)

## 2014-11-30 LAB — TSH: TSH: 0.01 u[IU]/mL — AB (ref 0.35–4.50)

## 2014-12-06 ENCOUNTER — Other Ambulatory Visit: Payer: Self-pay | Admitting: Family Medicine

## 2014-12-10 ENCOUNTER — Ambulatory Visit (INDEPENDENT_AMBULATORY_CARE_PROVIDER_SITE_OTHER): Payer: Medicare Other | Admitting: Family Medicine

## 2014-12-10 ENCOUNTER — Encounter: Payer: Self-pay | Admitting: Family Medicine

## 2014-12-10 VITALS — BP 169/61 | HR 98 | Temp 98.3°F | Ht 62.0 in | Wt 139.0 lb

## 2014-12-10 DIAGNOSIS — Z7189 Other specified counseling: Secondary | ICD-10-CM | POA: Insufficient documentation

## 2014-12-10 DIAGNOSIS — I10 Essential (primary) hypertension: Secondary | ICD-10-CM

## 2014-12-10 DIAGNOSIS — Z23 Encounter for immunization: Secondary | ICD-10-CM | POA: Diagnosis not present

## 2014-12-10 DIAGNOSIS — E7800 Pure hypercholesterolemia, unspecified: Secondary | ICD-10-CM | POA: Diagnosis not present

## 2014-12-10 DIAGNOSIS — R7303 Prediabetes: Secondary | ICD-10-CM | POA: Diagnosis not present

## 2014-12-10 DIAGNOSIS — Z0000 Encounter for general adult medical examination without abnormal findings: Secondary | ICD-10-CM | POA: Diagnosis not present

## 2014-12-10 DIAGNOSIS — E038 Other specified hypothyroidism: Secondary | ICD-10-CM

## 2014-12-10 DIAGNOSIS — Z7689 Persons encountering health services in other specified circumstances: Secondary | ICD-10-CM

## 2014-12-10 DIAGNOSIS — Z1211 Encounter for screening for malignant neoplasm of colon: Secondary | ICD-10-CM

## 2014-12-10 LAB — T4, FREE: Free T4: 1.89 ng/dL — ABNORMAL HIGH (ref 0.60–1.60)

## 2014-12-10 LAB — T3, FREE: T3 FREE: 5 pg/mL — AB (ref 2.3–4.2)

## 2014-12-10 LAB — HEMOGLOBIN A1C: Hgb A1c MFr Bld: 5.6 % (ref 4.6–6.5)

## 2014-12-10 NOTE — Assessment & Plan Note (Signed)
Send for A1C. Work on low [employer] and exercise.

## 2014-12-10 NOTE — Progress Notes (Signed)
Pre visit review using our clinic review tool, if applicable. No additional management support is needed unless otherwise documented below in the visit note. 

## 2014-12-10 NOTE — Patient Instructions (Addendum)
Work on healthy eating , increase protein in diet.  Increase exercise like walking.  Stop at lab on way out for labs test. Follow BP at home and call with measurements.

## 2014-12-10 NOTE — Assessment & Plan Note (Signed)
TSH low, weight loss somewhat unexpected.. eval with free T3 and T4

## 2014-12-10 NOTE — Addendum Note (Signed)
Addended by: Carter Kitten on: 12/10/2014 02:45 PM   Modules accepted: Orders

## 2014-12-10 NOTE — Assessment & Plan Note (Signed)
Well controlled. Continue current medication. Encouraged exercise, weight loss, healthy eating habits.  

## 2014-12-10 NOTE — Assessment & Plan Note (Signed)
Has some white coat HTN... Follow at home and call with measurements.

## 2014-12-10 NOTE — Progress Notes (Signed)
I have personally reviewed the Medicare Annual Wellness questionnaire and have noted 1. The patient's medical and social history 2. Their use of alcohol, tobacco or illicit drugs 3. Their current medications and supplements 4. The patient's functional ability including ADL's, fall risks, home safety risks and hearing or visual             impairment. 5. Diet and physical activities 6. Evidence for depression or mood disorders 7.         Updated provider list Cognitive evaluation was performed and recorded on pt medicare questionnaire form. The patients weight, height, BMI and visual acuity have been recorded in the chart  I have made referrals, counseling and provided education to the patient based review of the above and I have provided the pt with a written personalized care plan for preventive services.     Wt Readings from Last 3 Encounters:  12/10/14 139 lb (63.05 kg)  11/07/13 149 lb 8 oz (67.813 kg)  07/18/13 153 lb 12 oz (69.741 kg)  She has lost 16 lbs. She has tried to avoid greasy foods, starch. She skips some meals. She feels pretty good overall. Body mass index is 25.42 kg/(m^2).   Prediabetes: well controlled. Lab Results  Component Value Date   HGBA1C 5.9 10/31/2013   Hypertension: Inadequate control  In office today on hyzaar.  BP Readings from Last 3 Encounters:  12/10/14 169/61  11/07/13 128/60  07/18/13 140/62  Using medication without problems or lightheadedness: None  Chest pain with exertion:None  Edema:None  Short of breath:None  Average home BPs: 117-131/63-68  Other issues:   Hypothyroid in past, off medication.  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  Other complaints:   Review of Systems  Constitutional: Negative for fever and fatigue.  HENT: Negative for ear pain.  Eyes: Negative for pain and itching.  Respiratory: Negative for cough, shortness of breath and wheezing.  Cardiovascular: Negative for chest pain, palpitations and leg swelling.  Gastrointestinal: Negative for abdominal pain, diarrhea, constipation and blood in stool.  Genitourinary: Negative for dysuria, hematuria, vaginal bleeding, vaginal discharge and difficulty urinating.  Psychiatric/Behavioral: Negative for dysphoric mood. No depression or anxiety, no SI.  MSK: hand and left knee pain off and on  Objective:   Physical Exam  Constitutional: Vital signs are normal. She appears well-developed and well-nourished. She is cooperative. Non-toxic appearance. She does not appear ill. No distress.  HENT:  Head: Normocephalic.  Right Ear: Hearing, tympanic membrane, external ear and ear canal normal.  Left Ear: Hearing, tympanic membrane, external ear and ear canal normal.  Nose: Mucosal edema and rhinorrhea present.  Eyes: Conjunctivae, EOM and lids are normal. Pupils are equal, round, and reactive to light. No foreign bodies found.  Neck: Trachea normal and normal range of motion. Neck supple. Carotid bruit is not present. No mass and no thyromegaly present.  Cardiovascular: Normal rate, regular rhythm, S1 normal, S2 normal, normal heart sounds and intact distal pulses. Exam reveals no gallop.  No murmur heard.  Pulmonary/Chest: Effort normal and breath sounds normal. No respiratory distress. She has  no wheezes. She has no rhonchi. She has no rales.  Abdominal: Soft. Normal appearance and bowel sounds are normal. She exhibits no distension, no fluid wave, no abdominal bruit and no mass. There is no hepatosplenomegaly. There is no tenderness. There is no  rebound, no guarding and no CVA tenderness. No hernia.  Genitourinary: No breast swelling, tenderness, discharge or bleeding.Lymphadenopathy:  She has no cervical adenopathy.  She has no axillary adenopathy.  Neurological: She is alert. She has normal strength. No cranial nerve deficit or sensory deficit.  Skin: Skin is warm, dry and intact. No rash noted.  Psychiatric: Her speech is normal and behavior is normal. Judgment normal. Her mood appears not anxious. Cognition and memory are normal. She does not exhibit a depressed mood.  Assessment & Plan:   Annual Medicare Wellness: The patient's preventative maintenance and recommended screening tests for an annual wellness exam were reviewed in full today.  Brought up to date unless services declined.  Counselled on the importance of diet, exercise, and its role in overall health and mortality.  The patient's FH and SH was reviewed, including their home life, tobacco status, and drug and alcohol status.   Vaccines are up to date, flu at work, GIVEN prevnar  TODAY.  DVE/PAP: no pap indicated. No family history of female cancer. No symptoms. Mammogram nml in 01/2014 Last DEXA 2013.. Nml. Repeat in 5 years.  Yearly stool cards.. Given today.  Nonsmoker

## 2014-12-11 ENCOUNTER — Other Ambulatory Visit: Payer: Self-pay

## 2014-12-11 ENCOUNTER — Telehealth: Payer: Self-pay | Admitting: Family Medicine

## 2014-12-11 DIAGNOSIS — E0590 Thyrotoxicosis, unspecified without thyrotoxic crisis or storm: Secondary | ICD-10-CM

## 2014-12-11 DIAGNOSIS — Z1231 Encounter for screening mammogram for malignant neoplasm of breast: Secondary | ICD-10-CM

## 2014-12-11 NOTE — Telephone Encounter (Signed)
-----   Message from Carter Kitten, May sent at 12/11/2014 10:21 AM EST ----- [name] notified as instructed by telephone.  She is not currently taking any thyroid medication.  She is agreeable to the Endo referral.  Will await call from [name] or [name] with that appointment.

## 2014-12-17 ENCOUNTER — Ambulatory Visit (INDEPENDENT_AMBULATORY_CARE_PROVIDER_SITE_OTHER): Payer: Medicare Other | Admitting: Endocrinology

## 2014-12-17 ENCOUNTER — Encounter: Payer: Self-pay | Admitting: Endocrinology

## 2014-12-17 VITALS — BP 134/56 | HR 90 | Temp 98.5°F | Ht 62.0 in | Wt 136.0 lb

## 2014-12-17 DIAGNOSIS — E0500 Thyrotoxicosis with diffuse goiter without thyrotoxic crisis or storm: Secondary | ICD-10-CM

## 2014-12-17 MED ORDER — METHIMAZOLE 5 MG PO TABS: 5.0000 mg | ORAL_TABLET | Freq: Two times a day (BID) | ORAL | Status: DC

## 2014-12-17 NOTE — Patient Instructions (Signed)
ANTITHYROID DRUGS    Antithyroid drugs (also called thionamides) are most often used to treat an overactive thyroid (hyperthyroidism) such as caused by Graves' disease. These drugs work by blocking the formation of thyroid hormone by the thyroid gland  Antithyroid drugs have several benefits and a few side effects. It is important to learn as much as possible about the treatment of Graves' disease and to discuss all of the possible effects of antithyroid drugs with your doctor     FUNCTION OF ANTITHYROID DRUGS - Antithyroid drugs decrease the levels of the two hormones produced by the thyroid, thyroxine (T4) and triiodothyronine (T3).   Antithyroid drugs may be used: ?As a short-term treatment in people with Graves' hyperthyroidism, to prepare for thyroid surgery or radioiodine. ?As a long-term treatment. Approximately 30 percent of people with Graves' disease will have a remission after prolonged treatment with antithyroid drugs.  ?To treat hyperthyroidism associated with toxic multinodular goiter or a toxic adenoma ("hot nodule"), usually to prepare for thyroid surgery or radioiodine.  ?To treat women with hyperthyroidism during pregnancy.  You will need to take antithyroid drugs for at least three weeks (usually six to eight weeks or longer) to lower thyroid hormone levels. This is because they only block formation of new thyroid hormone; they do not remove thyroid hormones that are already in the thyroid and the blood stream. If you frequently miss taking the antithyroid drug, thyroid hormone synthesis may resume quickly and replenish thyroid gland stores, prolonging or preventing adequate control of the hyperthyroidism.  TYPES OF ANTITHYROID DRUGS - Two antithyroid drugs are currently available in the [place]: propylthiouracil (PTU) and methimazole (MMI, Tapazole).   Methimazole (MMI) - MMI is usually preferred over PTU because it reverses hyperthyroidism more quickly and has fewer  side effects. MMI requires an average of six weeks to lower T4 levels to normal and is often given before radioactive iodine treatment. MMI can be taken once per day. Propylthiouracil (PTU) - PTU does not reverse hyperthyroidism as rapidly as MMI and it has more side effects. Because of its potential for liver damage, it is used only when MMI is not appropriate. PTU must be taken two to three times per the day.  Antithyroid drugs during pregnancy - PTU used to be the drug of choice during pregnancy because it causes less severe birth defects than methimazole. But experts now recommend that PTU be given during the first trimester only. This is because there have been rare cases of liver damage in people taking PTU. After the first trimester, women should switch to methimazole for the rest of the pregnancy. For women who are nursing, methimazole is probably a better choice than PTU (to avoid liver side effects). If you take antithyroid drugs, you should discuss your treatment with your doctor before becoming pregnant. Having radioiodine treatment at least six months before becoming pregnant can eliminate the need for antithyroid treatment during pregnancy.  Antithyroid drug side effects - Most of the side effects of antithyroid drugs are minor, but major side effects can occur. Because there is no way to predict who will experience side effects, it is important to discuss all possible side effects before starting treatment.  If you cannot tolerate antithyroid treatments, you can consider radioiodine treatment or surgery.  Minor side effects - Up to 15 percent of people who take an antithyroid drug have minor side effects. Both MMI and PTU can cause itching, rash, hives, joint pain and swelling, fever, changes in taste, nausea, and  vomiting. If one antithyroid drug causes side effects, switching to the other drug may be helpful. However, about half of people who have side effects with one drug will have  similar side effects with the other. Nausea and vomiting may depend on the dose; spreading large doses out through the day can reduce side effects.  Major side effects - Fortunately, the major side effects of antithyroid drugs are very rare. ?Agranulocytosis - Agranulocytosis is a term used to describe a severe decrease in the production of white blood cells. This condition is extremely serious, but affects only one out of every 200 to 500 people who take an antithyroid drug. Elderly people taking PTU and those who take high doses of MMI may be at higher risk of this side effect.  Agranulocytosis more commonly occurs within the first three months of starting treatment with an antithyroid drug, but can occur at any time. If you develop a sore throat, fever, or other signs or symptoms of infection, you should stop your medicine and immediately call your doctor or nurse to have a complete blood count (CBC). Serious and potentially life threatening infections, or even death, can occur before agranulocytosis resolves. However, once the antithyroid drug is stopped, agranulocytosis usually resolves within a week. ?Other - There are three other very rare complications of antithyroid drugs: liver damage (more common with PTU), aplastic anemia (failure of the bone marrow to produce blood cells), and vasculitis (inflammation of blood vessels associated with PTU).  PTU-related liver damage typically occurs within three months of starting the drug. If you develop jaundice, dark urine, light stools, abdominal pain, loss of appetite, nausea, or other evidence of liver dysfunction, you should discontinue the drug immediately and contact your clinician for assessment of liver function. PTU-related liver failure can be serious and potentially life threatening.  The risk of liver damage from PTU is an important concern, particularly in children. For this reason, MMI is the first choice for treating  hyperthyroidism.  MONITORING THYROID HORMONES DURING TREATMENT - During treatment, your blood thyroid hormone levels will be monitored periodically, between every 3-8 weeks . Antithyroid drugs typically reduce levels of both triiodothyronine (T3) and thyroxine (T4), but levels of T3 may take longer to return to normal. Thyroid-stimulating hormone (TSH) levels usually take the longest to return to normal. About 30 percent of people who take an antithyroid drug for one to two years will have prolonged remission of Graves' disease. It is not known if the antithyroid drug plays an active role in this remission or if it simply controls thyroid hormone levels until Graves' disease resolves on its own.

## 2014-12-17 NOTE — Progress Notes (Signed)
Patient ID: Alexandria Petersen, female   DOB: [birth year], 74 y.o.   MRN: [id number]                                                                                                               Reason for Appointment:  Hyperthyroidism, new consultation  Referring physician: Diona Browner   History of Present Illness:   Patient was noticed to have had lost weight on her annual physical exam in 12/16  She thinks she has been probably losing weight since about April and not eating as well At times she is able to eat early well but other times only a few crackers at her mealtime  On questioning the patient she has only occasional symptoms of palpitations or feeling warm but otherwise does not complain of shakiness, fatigue or sweating Her daughter thinks that she seemed to be getting more tired and sitting down during Thanksgiving Also she has to climb several stairs she may feel a little weaker in her knees    Wt Readings from Last 3 Encounters:  12/17/14 136 lb (61.689 kg)  12/10/14 139 lb (63.05 kg)  11/07/13 149 lb 8 oz (67.813 kg)   She has been periodically  evaluated with thyroid function tests and they showed the following:     Lab Results  Component Value Date   FREET4 1.89* 12/10/2014   FREET4 1.26 11/07/2013   TSH 0.01* 11/30/2014   TSH 0.21* 11/07/2013   TSH 0.23* 10/31/2013    Treatments so far: None  Past history: Several years ago the patient was given unknown thyroid medication which she took for about 4 years, initially apparently she felt less tired with this but does not know why this was stopped also and no records are available TSH level has been previously normal as far back as 2008     Medication List       This list is accurate as of: 12/17/14  2:56 PM.  Always use your most recent med list.               CENTRUM SILVER ADULT 50+ PO  Take 1 tablet by mouth daily.     GLUCOSAMINE CHOND COMPLEX/MSM Tabs  Take 1,103 mg by mouth 2 (two) times daily.     losartan-hydrochlorothiazide 50-12.5 MG tablet  Commonly known as:  HYZAAR  TAKE 1 TABLET BY MOUTH EVERY DAY     methimazole 5 MG tablet  Commonly known as:  TAPAZOLE  Take 1 tablet (5 mg total) by mouth 2 (two) times daily.     Omega 3 1000 MG Caps  Take 1 capsule by mouth daily. [employer]     pravastatin 40 MG tablet  Commonly known as:  PRAVACHOL  TAKE 1 TABLET BY MOUTH DAILY            Past Medical History  Diagnosis Date  . Hypertension     Past Surgical History  Procedure Laterality Date  . Tubal ligation    . Shoulder surgery  right    Family History  Problem Relation Age of Onset  . Hypertension Mother   . Diabetes Mother   . Hypertension Father   . Diabetes Sister   . Diabetes Brother   . Hypothyroidism Daughter     Social History:  reports that she has never smoked. She has never used smokeless tobacco. She reports that she does not drink alcohol or use illicit drugs.  Allergies:  Allergies  Allergen Reactions  . Penicillins     REACTION: hallucinations    Review of Systems:  Review of Systems  Constitutional: Positive for weight loss. Negative for malaise.  HENT: Negative for headaches.   Eyes: Negative for blurred vision.       Occasionally may feel irritation in her eyes  Respiratory: Negative for shortness of breath.   Cardiovascular: Positive for palpitations. Negative for leg swelling.  Gastrointestinal: Negative for constipation and diarrhea.  Endocrine: Positive for heat intolerance.       Sometimes  Genitourinary: Negative for frequency.  Musculoskeletal: Positive for joint pain.       Has some knee joint pain  Skin: Negative for rash, abnormal pigmentation and itching.  Neurological: Negative for tremors.      Examination:   BP 134/56 mmHg  Pulse 90  Temp(Src) 98.5 F (36.9 C) (Oral)  Ht 5\' 2"  (1.575 m)  Wt 136 lb (61.689 kg)  BMI 24.87 kg/m2  SpO2 98%   General Appearance:  well-built and  nourished, pleasant, not anxious or hyperkinetic.        Eyes: No unusual prominence, lid lag or stare. No swelling of the eyelids  Neck: The thyroid is enlarged about 2 times normal, smooth, slightly firm, non-tender and diffuse, relatively larger on the right.  There is no lymphadenopathy .          Heart: normal S1 and S2, no murmurs.  Heart rate about 80, regular .          Lungs: breath sounds are clear bilaterally Abdomen: no hepatosplenomegaly or other palpable abnormality  Extremities: hands are warm. No ankle edema. Neurological: Deep tendon reflexes at biceps are appearing normal. Minimal fine tremors are present on the left fingers with outstretched hands. Skin: No rash, abnormal thickening of the skin on legs or pigmentation seen     Assessment/Plan:   Hyperthyroidism, Likely to be from Graves' disease   She may have had mild hypothyroidism in the past but subsequently was not taking any medications She appears to have had onset of symptoms in the last few weeks presenting mostly with decreased appetite and weight loss Clearly her free T4 and T3 are above normal with suppressed TSH   Discussed with the patient the hyperthyroidism as being an autoimmune thyroid disease.  Explained the options for treatment including antithyroid drugs and radioactive iodine.  Discussed the pros and cons for each treatment: Antithyroid drugs would be reasonable for mild disease but would need frequent followup with lab monitoring as well as potential for side effects from the medications and uncertainty about long-term cure of the problem Discussed that I-131 treatment is safe and simple to do but will result in long-term hypothyroidism that will result from ablation of the thyroid tissue and the need for lifelong supplementation and periodic monitoring.  Patient handout on hyperthyroidism and antithyroid drugs given  Since her hyperthyroidism is relatively mild she may do very well with  antithyroid drugs alone and is agreeable to a trial of this for the next few months  with regular follow-up  Patient understands the above discussion and treatment options. All questions were answered satisfactorily   [facility] 12/17/2014, 2:56 PM

## 2014-12-22 ENCOUNTER — Other Ambulatory Visit (INDEPENDENT_AMBULATORY_CARE_PROVIDER_SITE_OTHER): Payer: Medicare Other

## 2014-12-22 ENCOUNTER — Encounter: Payer: Self-pay | Admitting: *Deleted

## 2014-12-22 DIAGNOSIS — Z1211 Encounter for screening for malignant neoplasm of colon: Secondary | ICD-10-CM

## 2014-12-22 LAB — FECAL OCCULT BLOOD, IMMUNOCHEMICAL: Fecal Occult Bld: NEGATIVE

## 2015-01-07 ENCOUNTER — Ambulatory Visit: Payer: Medicare Other | Admitting: Endocrinology

## 2015-01-14 ENCOUNTER — Other Ambulatory Visit (INDEPENDENT_AMBULATORY_CARE_PROVIDER_SITE_OTHER): Payer: Medicare Other

## 2015-01-14 DIAGNOSIS — E0500 Thyrotoxicosis with diffuse goiter without thyrotoxic crisis or storm: Secondary | ICD-10-CM

## 2015-01-14 LAB — T3, FREE: T3 FREE: 3.3 pg/mL (ref 2.3–4.2)

## 2015-01-14 LAB — T4, FREE: FREE T4: 0.98 ng/dL (ref 0.60–1.60)

## 2015-01-15 ENCOUNTER — Telehealth: Payer: Self-pay | Admitting: *Deleted

## 2015-01-15 NOTE — Telephone Encounter (Signed)
Patient is calling for her lab results.  

## 2015-01-15 NOTE — Telephone Encounter (Signed)
Will discuss on office visit 

## 2015-01-15 NOTE — Telephone Encounter (Signed)
Noted, she is aware. 

## 2015-01-18 ENCOUNTER — Ambulatory Visit: Payer: Medicare Other | Admitting: Endocrinology

## 2015-01-21 ENCOUNTER — Encounter: Payer: Self-pay | Admitting: Endocrinology

## 2015-01-21 ENCOUNTER — Ambulatory Visit (INDEPENDENT_AMBULATORY_CARE_PROVIDER_SITE_OTHER): Payer: Medicare Other | Admitting: Endocrinology

## 2015-01-21 VITALS — Ht 62.0 in

## 2015-01-21 DIAGNOSIS — E0500 Thyrotoxicosis with diffuse goiter without thyrotoxic crisis or storm: Secondary | ICD-10-CM

## 2015-01-21 NOTE — Progress Notes (Signed)
Patient ID: Alexandria Petersen, female   DOB: [birth year], 75 y.o.   MRN: [id number]                                                                                                               Reason for Appointment:  Hyperthyroidism, follow up   Referring physician: Diona Browner   History of Present Illness:      At baseline she had symptoms of weight loss, some palpitations , feeling tired, occasionally feeling warm and some weakness in her legs. Also was having decreased appetite.  She previously had  Suppressed TSH levels since at least 10/2013  Patient was found to have abnormal thyroid functions including increased free T4 and free T3 levels   She was also found to have a goiter on exam initially.  She has been started on  Methimazole 5 mg twice a day   She thinks she is feeling better with her appetite, has not had her palpitations and only occasionally feels hot.  Her daughter think she has more energy and her weight is leveled off as well as she is eating better    Wt Readings from Last 3 Encounters:  12/17/14 136 lb (61.689 kg)  12/10/14 139 lb (63.05 kg)  11/07/13 149 lb 8 oz (67.813 kg)     Lab Results  Component Value Date   FREET4 0.98 01/14/2015   FREET4 1.89* 12/10/2014   FREET4 1.26 11/07/2013   TSH 0.01* 11/30/2014   TSH 0.21* 11/07/2013   TSH 0.23* 10/31/2013    Treatments so far: None  Past history: Several years ago the patient was given unknown thyroid medication which she took for about 4 years, initially apparently she felt less tired with this but does not know why this was stopped also and no records are available TSH level has been previously normal as far back as 2008     Medication List       This list is accurate as of: 01/21/15  5:15 PM.  Always use your most recent med list.               CENTRUM SILVER ADULT 50+ PO  Take 1 tablet by mouth daily.     GLUCOSAMINE CHOND COMPLEX/MSM Tabs  Take 1,103 mg by mouth 2 (two) times daily.       losartan-hydrochlorothiazide 50-12.5 MG tablet  Commonly known as:  HYZAAR  TAKE 1 TABLET BY MOUTH EVERY DAY     methimazole 5 MG tablet  Commonly known as:  TAPAZOLE  Take 1 tablet (5 mg total) by mouth 2 (two) times daily.     Omega 3 1000 MG Caps  Take 1 capsule by mouth daily. [employer]     pravastatin 40 MG tablet  Commonly known as:  PRAVACHOL  TAKE 1 TABLET BY MOUTH DAILY            Past Medical History  Diagnosis Date  . Hypertension     Past Surgical History  Procedure Laterality Date  .  Tubal ligation    . Shoulder surgery      right    Family History  Problem Relation Age of Onset  . Hypertension Mother   . Diabetes Mother   . Hypertension Father   . Diabetes Sister   . Diabetes Brother   . Hypothyroidism Daughter     Social History:  reports that she has never smoked. She has never used smokeless tobacco. She reports that she does not drink alcohol or use illicit drugs.  Allergies:  Allergies  Allergen Reactions  . Penicillins     REACTION: hallucinations    Review of Systems:  Review of Systems    Examination:   Ht 5\' 2"  (1.575 m)   General Appearance:   She looks well   Neck: The thyroid is enlarged about 2 times normal, smooth, slightly firm , not clearly palpable on the left  Neurological: Deep tendon reflexes at biceps are normal. No tremors    Assessment/Plan:   Hyperthyroidism, Likely to be from Graves' disease    She appears to be symptomatically better with starting methimazole 5 mg twice a day her labs are back to normal including free T3 level.  She will continue the same dose and follow-up in another month to have dosage adjustment if needed  [facility] 01/21/2015, 5:15 PM

## 2015-01-22 ENCOUNTER — Ambulatory Visit
Admission: RE | Admit: 2015-01-22 | Discharge: 2015-01-22 | Disposition: A | Discharge status: Home / Self Care | Payer: Medicare Other | Source: Ambulatory Visit

## 2015-01-22 DIAGNOSIS — Z1231 Encounter for screening mammogram for malignant neoplasm of breast: Secondary | ICD-10-CM

## 2015-02-15 ENCOUNTER — Other Ambulatory Visit (INDEPENDENT_AMBULATORY_CARE_PROVIDER_SITE_OTHER): Payer: Medicare Other

## 2015-02-15 DIAGNOSIS — E0500 Thyrotoxicosis with diffuse goiter without thyrotoxic crisis or storm: Secondary | ICD-10-CM

## 2015-02-15 LAB — T4, FREE: FREE T4: 0.58 ng/dL — AB (ref 0.60–1.60)

## 2015-02-15 LAB — TSH: TSH: 0.9 u[IU]/mL (ref 0.35–4.50)

## 2015-03-03 DIAGNOSIS — H25813 Combined forms of age-related cataract, bilateral: Secondary | ICD-10-CM | POA: Diagnosis not present

## 2015-03-06 ENCOUNTER — Other Ambulatory Visit: Payer: Self-pay | Admitting: Family Medicine

## 2015-03-18 ENCOUNTER — Encounter: Payer: Self-pay | Admitting: Endocrinology

## 2015-03-18 ENCOUNTER — Ambulatory Visit (INDEPENDENT_AMBULATORY_CARE_PROVIDER_SITE_OTHER): Payer: Medicare Other | Admitting: Endocrinology

## 2015-03-18 VITALS — BP 126/64 | HR 81 | Temp 98.8°F | Resp 14 | Ht 62.0 in | Wt 142.4 lb

## 2015-03-18 DIAGNOSIS — E0500 Thyrotoxicosis with diffuse goiter without thyrotoxic crisis or storm: Secondary | ICD-10-CM | POA: Diagnosis not present

## 2015-03-18 LAB — T4, FREE: FREE T4: 0.39 ng/dL — AB (ref 0.60–1.60)

## 2015-03-18 LAB — TSH: TSH: 20.56 u[IU]/mL — AB (ref 0.35–4.50)

## 2015-03-18 NOTE — Progress Notes (Signed)
Quick Note:  Please let patient know that the Thyroid level is very low, need to stop methimazole completely  keep follow-up appointment ______

## 2015-03-18 NOTE — Progress Notes (Signed)
Patient ID: Alexandria Petersen, female   DOB: [birth year], 75 y.o.   MRN: [id number]                                                                                                               Reason for Appointment:  Hyperthyroidism, follow up   Referring physician: Diona Browner   History of Present Illness:      At baseline she had symptoms of weight loss, some palpitations, feeling tired, occasionally feeling warm and some weakness in her legs. Also was having decreased appetite.  She previously had suppressed TSH levels since at least 10/2013  Patient was found to have abnormal thyroid functions including increased free T4 and free T3 levels in 12/16  She was also found to have a goiter on exam initially. Was started on Methimazole 5 mg twice a day in 12/2014   She is feeling better with her appetite, has no fatigue and No he can tolerance   She does now complaining of feeling cold more easily at times   She has gained some weight.     On her last visit in 1/17 her labs were quite normal and she was told to continue the dose but follow-up in 1 month  She did have her labs last month but did not come for follow-up visit   Wt Readings from Last 3 Encounters:  03/18/15 142 lb 6.4 oz (64.592 kg)  12/17/14 136 lb (61.689 kg)  12/10/14 139 lb (63.05 kg)     Lab Results  Component Value Date   FREET4 0.39* 03/18/2015   FREET4 0.58* 02/15/2015   FREET4 0.98 01/14/2015   TSH 20.56* 03/18/2015   TSH 0.90 02/15/2015   TSH 0.01* 11/30/2014   Lab Results  Component Value Date   T3FREE 3.3 01/14/2015   T3FREE 5.0* 12/10/2014       Past history: Several years ago the patient was given unknown thyroid medication which she took for about 4 years, initially apparently she felt less tired with this but does not know why this was stopped also and no records are available TSH level has been previously normal as far back as 2008     Medication List       This list is accurate as  of: 03/18/15  9:39 PM.  Always use your most recent med list.               CENTRUM SILVER ADULT 50+ PO  Take 1 tablet by mouth daily.     GLUCOSAMINE CHOND COMPLEX/MSM Tabs  Take 1,103 mg by mouth 2 (two) times daily.     losartan-hydrochlorothiazide 50-12.5 MG tablet  Commonly known as:  HYZAAR  TAKE 1 TABLET BY MOUTH EVERY DAY     methimazole 5 MG tablet  Commonly known as:  TAPAZOLE  Take 1 tablet (5 mg total) by mouth 2 (two) times daily.     Omega 3 1000 MG Caps  Take 1 capsule by mouth daily. Barry  pravastatin 40 MG tablet  Commonly known as:  PRAVACHOL  TAKE 1 TABLET BY MOUTH DAILY            Past Medical History  Diagnosis Date  . Hypertension     Past Surgical History  Procedure Laterality Date  . Tubal ligation    . Shoulder surgery      right    Family History  Problem Relation Age of Onset  . Hypertension Mother   . Diabetes Mother   . Hypertension Father   . Diabetes Sister   . Diabetes Brother   . Hypothyroidism Daughter     Social History:  reports that she has never smoked. She has never used smokeless tobacco. She reports that she does not drink alcohol or use illicit drugs.  Allergies:  Allergies  Allergen Reactions  . Penicillins     REACTION: hallucinations    Review of Systems:  Review of Systems    Examination:   BP 126/64 mmHg  Pulse 81  Temp(Src) 98.8 F (37.1 C)  Resp 14  Ht 5\' 2"  (1.575 m)  Wt 142 lb 6.4 oz (64.592 kg)  BMI 26.04 kg/m2  SpO2 97%   General Appearance:   She looks well   Neck: The thyroid is enlarged about 2 times normal, smooth, slightly firm   Neurological: Deep tendon reflexes at biceps are normal.    Assessment/Plan:   Hyperthyroidism,  from Graves' disease    She as a clinical improvement with taking methimazole since 12/ 16  However in February her free T4 was low normal and she has continued the same dose  She is now coming back for follow-up and will need to  reassess her labs before deciding on her further medication  Dose   Ayson Cherubini 03/18/2015, 9:39 PM   Note: This office note was prepared with Dragon voice recognition system technology. Any transcriptional errors that result from this process are unintentional.    ADDENDUM: free T4 is significantly low and TSH is about 20.  She will need to stop methimazole and follow-up in 4 weeks

## 2015-04-08 ENCOUNTER — Other Ambulatory Visit: Payer: Self-pay | Admitting: Endocrinology

## 2015-04-13 ENCOUNTER — Other Ambulatory Visit (INDEPENDENT_AMBULATORY_CARE_PROVIDER_SITE_OTHER): Payer: Medicare Other

## 2015-04-13 DIAGNOSIS — E0500 Thyrotoxicosis with diffuse goiter without thyrotoxic crisis or storm: Secondary | ICD-10-CM

## 2015-04-13 LAB — T4, FREE: FREE T4: 1 ng/dL (ref 0.60–1.60)

## 2015-04-13 LAB — TSH: TSH: 1.09 u[IU]/mL (ref 0.35–4.50)

## 2015-04-16 ENCOUNTER — Ambulatory Visit (INDEPENDENT_AMBULATORY_CARE_PROVIDER_SITE_OTHER): Payer: Medicare Other | Admitting: Endocrinology

## 2015-04-16 ENCOUNTER — Encounter: Payer: Self-pay | Admitting: Endocrinology

## 2015-04-16 VITALS — BP 138/80 | HR 81 | Temp 98.0°F | Resp 14 | Ht 62.0 in | Wt 141.6 lb

## 2015-04-16 DIAGNOSIS — E049 Nontoxic goiter, unspecified: Secondary | ICD-10-CM | POA: Diagnosis not present

## 2015-04-16 DIAGNOSIS — Z8639 Personal history of other endocrine, nutritional and metabolic disease: Secondary | ICD-10-CM

## 2015-04-16 NOTE — Progress Notes (Signed)
Patient ID: Alexandria Petersen, female   DOB: 1940/06/30, 75 y.o.   MRN: [id number]                                                                                                               Reason for Appointment:  Hyperthyroidism, follow up   Referring physician: Diona Browner   History of Present Illness:      At baseline she had symptoms of weight loss, some palpitations, feeling tired, occasionally feeling warm and some weakness in her legs. Also was having decreased appetite.  She previously had suppressed TSH levels since at least 10/2013  Patient was found to have abnormal thyroid functions including increased free T4 and free T3 levels in 12/16  She was also found to have a goiter on exam initially. Was started on Methimazole 5 mg twice a day in 12/2014  With this she was feeling better with her appetite and energy level    On her  visit in 1/17 her labs were quite normal and she was told to continue the dose but on follow-up her free T4 was significantly low and methimazole.    She does now complaining of feeling cold more easily at times but no fatigue Her thyroid levels are back to normal    Wt Readings from Last 3 Encounters:  04/16/15 141 lb 9.6 oz (64.229 kg)  03/18/15 142 lb 6.4 oz (64.592 kg)  12/17/14 136 lb (61.689 kg)     Lab Results  Component Value Date   FREET4 1.00 04/13/2015   FREET4 0.39* 03/18/2015   FREET4 0.58* 02/15/2015   TSH 1.09 04/13/2015   TSH 20.56* 03/18/2015   TSH 0.90 02/15/2015   Lab Results  Component Value Date   T3FREE 3.3 01/14/2015   T3FREE 5.0* 12/10/2014       Past history: Several years ago the patient was given unknown thyroid medication which she took for about 4 years, initially apparently she felt less tired with this but does not know why this was stopped also and no records are available TSH level has been previously normal as far back as 2008     Medication List       This list is accurate as of: 04/16/15  11:38 AM.  Always use your most recent med list.               CENTRUM SILVER ADULT 50+ PO  Take 1 tablet by mouth daily.     GLUCOSAMINE CHOND COMPLEX/MSM Tabs  Take 1,103 mg by mouth 2 (two) times daily.     losartan-hydrochlorothiazide 50-12.5 MG tablet  Commonly known as:  HYZAAR  TAKE 1 TABLET BY MOUTH EVERY DAY     Omega 3 1000 MG Caps  Take 1 capsule by mouth daily. [employer]     pravastatin 40 MG tablet  Commonly known as:  PRAVACHOL  TAKE 1 TABLET BY MOUTH DAILY            Past Medical History  Diagnosis Date  .  Hypertension     Past Surgical History  Procedure Laterality Date  . Tubal ligation    . Shoulder surgery      right    Family History  Problem Relation Age of Onset  . Hypertension Mother   . Diabetes Mother   . Hypertension Father   . Diabetes Sister   . Diabetes Brother   . Hypothyroidism Daughter     Social History:  reports that she has never smoked. She has never used smokeless tobacco. She reports that she does not drink alcohol or use illicit drugs.  Allergies:  Allergies  Allergen Reactions  . Penicillins     REACTION: hallucinations    Review of Systems:  Review of Systems    Examination:   BP 138/80 mmHg  Pulse 81  Temp(Src) 98 F (36.7 C)  Resp 14  Ht 5\' 2"  (1.575 m)  Wt 141 lb 9.6 oz (64.229 kg)  BMI 25.89 kg/m2  SpO2 98%   General Appearance:   She looks well   Neck: The thyroid is enlarged about 1-1/2- 2 times normal, mostly on the right, smooth, slightly firm   Neurological: Deep tendon reflexes at biceps are normal Skin looks normal.    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 Any transcriptional errors that result from this process are unintentional.

## 2015-04-16 NOTE — Patient Instructions (Signed)
No

## 2015-05-25 ENCOUNTER — Other Ambulatory Visit (INDEPENDENT_AMBULATORY_CARE_PROVIDER_SITE_OTHER): Payer: Medicare Other

## 2015-05-25 DIAGNOSIS — E049 Nontoxic goiter, unspecified: Secondary | ICD-10-CM

## 2015-05-25 LAB — T4, FREE: Free T4: 1.73 ng/dL — ABNORMAL HIGH (ref 0.60–1.60)

## 2015-05-25 LAB — TSH: TSH: 0.05 u[IU]/mL — ABNORMAL LOW (ref 0.35–4.50)

## 2015-05-25 LAB — T3, FREE: T3, Free: 4.5 pg/mL — ABNORMAL HIGH (ref 2.3–4.2)

## 2015-05-28 ENCOUNTER — Ambulatory Visit (INDEPENDENT_AMBULATORY_CARE_PROVIDER_SITE_OTHER): Payer: Medicare Other | Admitting: Endocrinology

## 2015-05-28 ENCOUNTER — Encounter: Payer: Self-pay | Admitting: Endocrinology

## 2015-05-28 VITALS — BP 134/64 | HR 95 | Ht 62.0 in | Wt 140.0 lb

## 2015-05-28 DIAGNOSIS — Z8639 Personal history of other endocrine, nutritional and metabolic disease: Secondary | ICD-10-CM | POA: Diagnosis not present

## 2015-05-28 NOTE — Patient Instructions (Signed)
Methimazole 1 pill daily

## 2015-05-28 NOTE — Progress Notes (Addendum)
Patient ID: Alexandria Petersen, female   DOB: September 28, 1940, 75 y.o.   MRN: [id number]                                                                                                               Reason for Appointment:  Hyperthyroidism, follow up   Referring physician: Diona Browner   History of Present Illness:     When she was first seen in 12/16 she had symptoms of weight loss,  palpitations, feeling tired, occasionally feeling warm and some weakness in her legs. Also was having decreased appetite.  She previously had suppressed TSH levels since at least 10/2013 Pretreatment labs showed increased free T4 and free T3 levels in 12/16  She was also found to have a goiter on exam initially. Was started on Methimazole 5 mg twice a day in 12/2014  With this she was feeling better with her appetite and energy level    On her  visit in 1/17 her labs were quite normal and she was told to continue the dose but she did not follow-up for a couple of months Subsequently on follow-up her free T4 was significantly low with taking 5 mg twice a day of methimazole. This was stopped In 4/17 her thyroid levels are quite normal without any medication    She does now feel more tired and she thinks her appetite may be slightly decreased.  No palpitations Her weight is down only about 1 pound Her thyroid levels are back to showing Hyperthyroidism    Wt Readings from Last 3 Encounters:  05/28/15 140 lb (63.504 kg)  04/16/15 141 lb 9.6 oz (64.229 kg)  03/18/15 142 lb 6.4 oz (64.592 kg)     Lab Results  Component Value Date   FREET4 1.73* 05/25/2015   FREET4 1.00 04/13/2015   FREET4 0.39* 03/18/2015   TSH 0.05* 05/25/2015   TSH 1.09 04/13/2015   TSH 20.56* 03/18/2015   Lab Results  Component Value Date   T3FREE 4.5* 05/25/2015   T3FREE 3.3 01/14/2015       Past history: Several years ago the patient was given unknown thyroid medication which she took for about 4 years, initially apparently she  felt less tired with this but does not know why this was stopped also and no records are available TSH level has been previously normal as far back as 2008     Medication List       This list is accurate as of: 05/28/15  5:03 PM.  Always use your most recent med list.               CENTRUM SILVER ADULT 50+ PO  Take 1 tablet by mouth daily.     GLUCOSAMINE CHOND COMPLEX/MSM Tabs  Take 1,103 mg by mouth 2 (two) times daily.     losartan-hydrochlorothiazide 50-12.5 MG tablet  Commonly known as:  HYZAAR  TAKE 1 TABLET BY MOUTH EVERY DAY     Omega 3 1000 MG Caps  Take 1 capsule by mouth daily. Wild  [employer] Oil     pravastatin 40 MG tablet  Commonly known as:  PRAVACHOL  TAKE 1 TABLET BY MOUTH DAILY            Past Medical History  Diagnosis Date  . Hypertension     Past Surgical History  Procedure Laterality Date  . Tubal ligation    . Shoulder surgery      right    Family History  Problem Relation Age of Onset  . Hypertension Mother   . Diabetes Mother   . Hypertension Father   . Diabetes Sister   . Diabetes Brother   . Hypothyroidism Daughter     Social History:  reports that she has never smoked. She has never used smokeless tobacco. She reports that she does not drink alcohol or use illicit drugs.  Allergies:  Allergies  Allergen Reactions  . Penicillins     REACTION: hallucinations     Review of Systems    Examination:   BP 134/64 mmHg  Pulse 95  Ht 5\' 2"  (1.575 m)  Wt 140 lb (63.504 kg)  BMI 25.60 kg/m2  SpO2 98%  Repeat pulse 76   General Appearance:   She looks well  Eyes look normal externally Neck: The thyroid is enlarged about  2 times normal, mostly on the right, smooth, firm   Neurological: Deep tendon reflexes at biceps are normal No tremor    Assessment/Plan:   Hyperthyroidism,  from Graves' disease   Although she had normal thyroid levels in April without any methimazole she is hyperthyroid again and appears  to have relapsed. Previously appeared to have progressively lower thyroid levels on taking 10 mg daily  Discussed the causes of hyperthyroidism and treatment options with the patient Again most likely since she is mildly hyperthyroid she may be able to get into remission with taking methimazole However emphasized to the patient that she does need to follow-up regularly  Will try her on 5 mg of methimazole for now and have her come back in 1 month   Dannya Pitkin 05/28/2015, 5:03 PM   Note: This office note was prepared with Dragon voice recognition system technology. Any transcriptional errors that result from this process are unintentional.

## 2015-06-25 ENCOUNTER — Other Ambulatory Visit (INDEPENDENT_AMBULATORY_CARE_PROVIDER_SITE_OTHER): Payer: Medicare Other

## 2015-06-25 DIAGNOSIS — Z8639 Personal history of other endocrine, nutritional and metabolic disease: Secondary | ICD-10-CM

## 2015-06-25 LAB — T3, FREE: T3, Free: 3.4 pg/mL (ref 2.3–4.2)

## 2015-06-25 LAB — T4, FREE: Free T4: 1.63 ng/dL — ABNORMAL HIGH (ref 0.60–1.60)

## 2015-06-25 LAB — TSH: TSH: 0.04 u[IU]/mL — ABNORMAL LOW (ref 0.35–4.50)

## 2015-06-30 ENCOUNTER — Ambulatory Visit (INDEPENDENT_AMBULATORY_CARE_PROVIDER_SITE_OTHER): Payer: Medicare Other | Admitting: Endocrinology

## 2015-06-30 ENCOUNTER — Encounter: Payer: Self-pay | Admitting: Endocrinology

## 2015-06-30 ENCOUNTER — Telehealth: Payer: Self-pay | Admitting: Internal Medicine

## 2015-06-30 DIAGNOSIS — E0500 Thyrotoxicosis with diffuse goiter without thyrotoxic crisis or storm: Secondary | ICD-10-CM

## 2015-06-30 MED ORDER — METHIMAZOLE 5 MG PO TABS: 5.0000 mg | ORAL_TABLET | Freq: Two times a day (BID) | ORAL | Status: DC

## 2015-06-30 NOTE — Telephone Encounter (Signed)
error 

## 2015-06-30 NOTE — Patient Instructions (Signed)
Take 2 thyroid pills in am 

## 2015-06-30 NOTE — Progress Notes (Signed)
Patient ID: Alexandria Petersen, female   DOB: 12/14/1940, 75 y.o.   MRN: [id number]                                                                                                               Reason for Appointment:  Hyperthyroidism, follow up   Referring physician: Diona Browner   History of Present Illness:   When she was first seen in 12/16 she had symptoms of weight loss,  palpitations, feeling tired, occasionally feeling warm and some weakness in her legs. Also was having decreased appetite.  She previously had suppressed TSH levels since at least 10/2013 Pretreatment labs showed increased free T4 and free T3 levels in 12/16  She was also found to have a goiter on exam initially. Was started on Methimazole 5 mg twice a day in 12/2014  With this she was feeling better with her appetite and energy level    On her  visit in 1/17 her labs were  normal and she was told to continue the dose but she did not follow-up  Subsequently on follow-up her free T4 was significantly low with taking 5 mg twice a day of methimazole. This was stopped and in 4/17 her thyroid levels are quite normal without any medication  RECENT history: On her follow-up in 5/17 she was having some complaints of feeling tired and decreased appetite Her thyroid levels again showed Hyperthyroidism and she was started back on 5 mg methimazole at this visit  She thinks she feels a little less tired but appetite is variable and has lost 2 pounds Occasionally may feel a little shaky but no heat intolerance or palpitations    Wt Readings from Last 3 Encounters:  06/30/15 138 lb (62.596 kg)  05/28/15 140 lb (63.504 kg)  04/16/15 141 lb 9.6 oz (64.229 kg)     Lab Results  Component Value Date   FREET4 1.63* 06/25/2015   FREET4 1.73* 05/25/2015   FREET4 1.00 04/13/2015   TSH 0.04* 06/25/2015   TSH 0.05* 05/25/2015   TSH 1.09 04/13/2015   Lab Results  Component Value Date   T3FREE 3.4 06/25/2015   T3FREE 4.5*  05/25/2015       Past history: Several years ago the patient was given unknown thyroid medication which she took for about 4 years, initially apparently she felt less tired with this but does not know why this was stopped also and no records are available TSH level has been previously normal as far back as 2008     Medication List       This list is accurate as of: 06/30/15  4:57 PM.  Always use your most recent med list.               CENTRUM SILVER ADULT 50+ PO  Take 1 tablet by mouth daily.     GLUCOSAMINE CHOND COMPLEX/MSM Tabs  Take 1,103 mg by mouth 2 (two) times daily.     losartan-hydrochlorothiazide 50-12.5 MG tablet  Commonly known as:  HYZAAR  TAKE  1 TABLET BY MOUTH EVERY DAY     methimazole 5 MG tablet  Commonly known as:  TAPAZOLE  Take 1 tablet (5 mg total) by mouth 2 (two) times daily.     Omega 3 1000 MG Caps  Take 1 capsule by mouth daily. [employer]     pravastatin 40 MG tablet  Commonly known as:  PRAVACHOL  TAKE 1 TABLET BY MOUTH DAILY            Past Medical History  Diagnosis Date  . Hypertension     Past Surgical History  Procedure Laterality Date  . Tubal ligation    . Shoulder surgery      right    Family History  Problem Relation Age of Onset  . Hypertension Mother   . Diabetes Mother   . Hypertension Father   . Diabetes Sister   . Diabetes Brother   . Hypothyroidism Daughter     Social History:  reports that she has never smoked. She has never used smokeless tobacco. She reports that she does not drink alcohol or use illicit drugs.  Allergies:  Allergies  Allergen Reactions  . Penicillins     REACTION: hallucinations     Review of Systems    Examination:   BP 128/56 mmHg  Pulse 88  Ht 5\' 2"  (1.575 m)  Wt 138 lb (62.596 kg)  BMI 25.23 kg/m2  SpO2 98%  Repeat pulse 76, Regular  She looks well Neck: The thyroid is enlarged about 2 times normal, mostly on the right, smooth, firm    Neurological: Deep tendon reflexes at biceps are normal No tremor    Assessment/Plan:   Hyperthyroidism,  from Graves' disease   Although she had normal thyroid levels in April without any methimazole she is hyperthyroid again With a trial of 5 mg methimazole for her thyroid levels are still abnormal although free T3 is improved She has lost 2 pounds since her last visit  Since she appears to be needing higher doses of methimazole for control will increase the dose to 10 mg until next visit Also to help decide on prognosis will check the thyrotropin receptor antibody on the next visit in 1 month    [facility] 06/30/2015, 4:57 PM   Note: This office note was prepared with Dragon voice recognition system technology. Any transcriptional errors that result from this process are unintentional.

## 2015-07-30 ENCOUNTER — Other Ambulatory Visit (INDEPENDENT_AMBULATORY_CARE_PROVIDER_SITE_OTHER): Payer: Medicare Other

## 2015-07-30 DIAGNOSIS — E0500 Thyrotoxicosis with diffuse goiter without thyrotoxic crisis or storm: Secondary | ICD-10-CM | POA: Diagnosis not present

## 2015-07-30 LAB — TSH: TSH: 0.28 u[IU]/mL — ABNORMAL LOW (ref 0.35–4.50)

## 2015-07-30 LAB — T4, FREE: Free T4: 0.74 ng/dL (ref 0.60–1.60)

## 2015-08-03 LAB — THYROTROPIN RECEPTOR AUTOABS: Thyrotropin Receptor Ab: 18.5 % — ABNORMAL HIGH (ref <=16.0)

## 2015-08-04 ENCOUNTER — Ambulatory Visit (INDEPENDENT_AMBULATORY_CARE_PROVIDER_SITE_OTHER): Payer: Medicare Other | Admitting: Endocrinology

## 2015-08-04 ENCOUNTER — Encounter: Payer: Self-pay | Admitting: Endocrinology

## 2015-08-04 ENCOUNTER — Other Ambulatory Visit: Payer: Self-pay | Admitting: Family Medicine

## 2015-08-04 VITALS — BP 152/70 | HR 83 | Wt 142.0 lb

## 2015-08-04 DIAGNOSIS — E0500 Thyrotoxicosis with diffuse goiter without thyrotoxic crisis or storm: Secondary | ICD-10-CM | POA: Diagnosis not present

## 2015-08-04 NOTE — Progress Notes (Signed)
Patient ID: Alexandria Petersen, female   DOB: [birth year], 75 y.o.   MRN: [id number]                                                                                                               Reason for Appointment:  Hyperthyroidism, follow up   Referring physician: Diona Browner   History of Present Illness:   When she was first seen in 12/16 she had symptoms of weight loss,  palpitations, feeling tired, occasionally feeling warm and some weakness in her legs. Also was having decreased appetite.  She previously had suppressed TSH levels since at least 10/2013 Pretreatment labs showed increased free T4 and free T3 levels in 12/16  She was also found to have a goiter on exam initially. Was started on Methimazole 5 mg twice a day in 12/2014  With this she was feeling better with her appetite and energy level    On her  visit in 1/17 her labs were  normal and she was told to continue the dose but she did not follow-up  Subsequently on follow-up her free T4 was significantly low with taking 5 mg twice a day of methimazole. This was stopped and in 4/17 her thyroid levels are quite normal without any medication  RECENT history: On her follow-up in 5/17 she was having some complaints of feeling tired and decreased appetite Her thyroid levels again showed Hyperthyroidism and she was started back on 5 mg methimazole at this visit However in 6/17 her thyroid levels are still high and she was still complaining of some fatigue and weight loss She is now taking 10 mg methimazole daily  She thinks she feels a little less tired, appetite and weight is better  She has no heat intolerance or shakiness or palpitations Thyroid levels are much improved now with free T4 low normal and TSH still low as expected but not completely suppressed   Wt Readings from Last 3 Encounters:  08/04/15 142 lb (64.4 kg)  06/30/15 138 lb (62.6 kg)  05/28/15 140 lb (63.5 kg)     Lab Results  Component Value Date   FREET4 0.74 07/30/2015   FREET4 1.63 (H) 06/25/2015   FREET4 1.73 (H) 05/25/2015   TSH 0.28 (L) 07/30/2015   TSH 0.04 (L) 06/25/2015   TSH 0.05 (L) 05/25/2015   Lab Results  Component Value Date   T3FREE 3.4 06/25/2015   T3FREE 4.5 (H) 05/25/2015       Past history: Several years ago the patient was given unknown thyroid medication which she took for about 4 years, initially apparently she felt less tired with this but does not know why this was stopped also and no records are available TSH level has been previously normal as far back as 2008     Medication List       Accurate as of 08/04/15  3:04 PM. Always use your most recent med list.          CENTRUM SILVER ADULT 50+ PO Take 1 tablet by mouth  daily.   GLUCOSAMINE CHOND COMPLEX/MSM Tabs Take 1,103 mg by mouth 2 (two) times daily.   losartan-hydrochlorothiazide 50-12.5 MG tablet Commonly known as:  HYZAAR TAKE 1 TABLET BY MOUTH EVERY DAY   methimazole 5 MG tablet Commonly known as:  TAPAZOLE Take 1 tablet (5 mg total) by mouth 2 (two) times daily.   Omega 3 1000 MG Caps Take 1 capsule by mouth daily. [employer]   pravastatin 40 MG tablet Commonly known as:  PRAVACHOL TAKE 1 TABLET BY MOUTH DAILY           Past Medical History:  Diagnosis Date  . Hypertension     Past Surgical History:  Procedure Laterality Date  . SHOULDER SURGERY     right  . TUBAL LIGATION      Family History  Problem Relation Age of Onset  . Hypertension Mother   . Diabetes Mother   . Hypertension Father   . Diabetes Sister   . Diabetes Brother   . Hypothyroidism Daughter     Social History:  reports that she has never smoked. She has never used smokeless tobacco. She reports that she does not drink alcohol or use drugs.  Allergies:  Allergies  Allergen Reactions  . Penicillins     REACTION: hallucinations     Review of Systems  BP Readings from Last 3 Encounters:  08/04/15 (!) 152/70  06/30/15  (!) 128/56  05/28/15 134/64     Examination:   BP (!) 152/70 (BP Location: Left Arm, Patient Position: Sitting)   Pulse 83   Wt 142 lb (64.4 kg)   SpO2 98%   BMI 25.97 kg/m      She looks well Neck: The thyroid is enlarged about 1.5-2 times normal, mostly on the right, smooth, firm   Neurological: Deep tendon reflexes at biceps are normal No tremor    Assessment/Plan:   Hyperthyroidism,  from Graves' disease   She continues to have Hyperthyroidism but now with 10 mg methimazole her free T4 level is low normal line subjectively she is doing well Goiter is slightly smaller Thyrotropin receptor antibody is still slightly high at 18, normal <about 16  She will now reduce her methimazole to 1-1/2 of the 5 mg tablets and follow-up in another 6 weeks again  Patient Instructions  Take 1 1/2 pills daily    Lakela Kuba 08/04/2015, 3:04 PM   Note: This office note was prepared with Estate agent. Any transcriptional errors that result from this process are unintentional.

## 2015-08-04 NOTE — Patient Instructions (Signed)
Take 1 1/2 pills daily 

## 2015-09-05 ENCOUNTER — Other Ambulatory Visit: Payer: Self-pay | Admitting: Endocrinology

## 2015-09-10 ENCOUNTER — Other Ambulatory Visit (INDEPENDENT_AMBULATORY_CARE_PROVIDER_SITE_OTHER): Payer: Medicare Other

## 2015-09-10 DIAGNOSIS — E0500 Thyrotoxicosis with diffuse goiter without thyrotoxic crisis or storm: Secondary | ICD-10-CM

## 2015-09-10 LAB — TSH: TSH: 6.6 u[IU]/mL — AB (ref 0.35–4.50)

## 2015-09-10 LAB — T4, FREE: FREE T4: 0.67 ng/dL (ref 0.60–1.60)

## 2015-09-15 ENCOUNTER — Encounter: Payer: Self-pay | Admitting: Endocrinology

## 2015-09-15 ENCOUNTER — Ambulatory Visit (INDEPENDENT_AMBULATORY_CARE_PROVIDER_SITE_OTHER): Payer: Medicare Other | Admitting: Endocrinology

## 2015-09-15 VITALS — BP 136/78 | HR 79 | Temp 98.2°F | Resp 14 | Ht 62.0 in | Wt 143.6 lb

## 2015-09-15 DIAGNOSIS — E0500 Thyrotoxicosis with diffuse goiter without thyrotoxic crisis or storm: Secondary | ICD-10-CM

## 2015-09-15 NOTE — Patient Instructions (Signed)
Take Methimazole for 2 weeks then stop on 09/29/15

## 2015-09-15 NOTE — Progress Notes (Signed)
Patient ID: Alexandria Petersen, female   DOB: 07-Feb-1940, 75 y.o.   MRN: [id number]                                                                                                               Reason for Appointment:  Hyperthyroidism, follow up   Referring physician: Diona Browner   History of Present Illness:   When she was first seen in 12/16 she had symptoms of weight loss,  palpitations, feeling tired, occasionally feeling warm and some weakness in her legs. Also was having decreased appetite.  She previously had suppressed TSH levels since at least 10/2013 Pretreatment labs showed increased free T4 and free T3 levels in 12/16  She was also found to have a goiter on exam initially.  Was started on Methimazole 5 mg twice a day in 12/2014  With this she was feeling better with her appetite and energy level    On her  visit in 1/17 her labs were  normal and she was told to continue the dose but she did not follow-up  Subsequently on follow-up her free T4 was significantly low with taking 5 mg twice a day of methimazole. This was stopped and in 4/17 her thyroid levels are quite normal without any medication  RECENT history: On her follow-up in 5/17 she was started back on 5 mg methimazole at this visit However in 6/17 her thyroid levels are still high and she was still complaining of some fatigue and weight loss, methimazole was increased to 10 mg Subsequently with free T4 low normal in July her dose was reduced to 7.5 mg now Thyrotropin receptor antibody in July was 18, normal <60  She thinks she feels less tired and only occasionally will have decreased appetite She may feel a little cord at times  Thyroid levels are much improved now with free T4 low normal and TSH starting to get high   Wt Readings from Last 3 Encounters:  09/15/15 143 lb 9.6 oz (65.1 kg)  08/04/15 142 lb (64.4 kg)  06/30/15 138 lb (62.6 kg)     Lab Results  Component Value Date   FREET4 0.67 09/10/2015   FREET4 0.74 07/30/2015   FREET4 1.63 (H) 06/25/2015   TSH 6.60 (H) 09/10/2015   TSH 0.28 (L) 07/30/2015   TSH 0.04 (L) 06/25/2015   Lab Results  Component Value Date   T3FREE 3.4 06/25/2015   T3FREE 4.5 (H) 05/25/2015       Past history: Several years ago the patient was given unknown thyroid medication which she took for about 4 years, initially apparently she felt less tired with this but does not know why this was stopped also and no records are available TSH level has been previously normal as far back as 2008     Medication List       Accurate as of 09/15/15  3:11 PM. Always use your most recent med list.          CENTRUM SILVER ADULT 50+ PO Take 1 tablet  by mouth daily.   GLUCOSAMINE CHOND COMPLEX/MSM Tabs Take 1,103 mg by mouth 2 (two) times daily.   losartan-hydrochlorothiazide 50-12.5 MG tablet Commonly known as:  HYZAAR TAKE 1 TABLET BY MOUTH EVERY DAY   methimazole 5 MG tablet Commonly known as:  TAPAZOLE TAKE 1 TABLET BY MOUTH TWICE A DAY   Omega 3 1000 MG Caps Take 1 capsule by mouth daily. [employer]   pravastatin 40 MG tablet Commonly known as:  PRAVACHOL TAKE 1 TABLET BY MOUTH DAILY           Past Medical History:  Diagnosis Date  . Hypertension     Past Surgical History:  Procedure Laterality Date  . SHOULDER SURGERY     right  . TUBAL LIGATION      Family History  Problem Relation Age of Onset  . Hypertension Mother   . Diabetes Mother   . Hypertension Father   . Diabetes Sister   . Diabetes Brother   . Hypothyroidism Daughter     Social History:  reports that she has never smoked. She has never used smokeless tobacco. She reports that she does not drink alcohol or use drugs.  Allergies:  Allergies  Allergen Reactions  . Penicillins     REACTION: hallucinations     Review of Systems  BP Readings from Last 3 Encounters:  09/15/15 136/78  08/04/15 (!) 152/70  06/30/15 (!) 128/56     Examination:     BP 136/78   Pulse 79   Temp 98.2 F (36.8 C)   Resp 14   Ht 5\' 2"  (1.575 m)   Wt 143 lb 9.6 oz (65.1 kg)   SpO2 97%   BMI 26.26 kg/m      She looks well Neck: The thyroid is enlarged about 1.5 times normal, mostly on the right, smooth, firm   Neurological: Deep tendon reflexes at biceps are showing slow relaxation No peripheral edema No dryness of skin    Assessment/Plan:   Hyperthyroidism,  from Graves' disease   She now appears to be mildly hypothyroid even with reducing her methimazole by 2.5 mg last time  Subjectively doing well Reflexes indicate mild hypothyroidism Has minimal thyromegaly now   Since her thyrotropin receptor antibody was only minimally increased on the last visit she probably is in remission now   She will taper off her methimazole over the next 2 weeks and stop She will follow-up in another 6 weeks again  Patient Instructions  Take Methimazole for 2 weeks then stop on 09/29/15    [facility] 09/15/2015, 3:11 PM   Note: This office note was prepared with Estate agent. Any transcriptional errors that result from this process are unintentional.

## 2015-10-25 ENCOUNTER — Other Ambulatory Visit (INDEPENDENT_AMBULATORY_CARE_PROVIDER_SITE_OTHER): Payer: Medicare Other

## 2015-10-25 DIAGNOSIS — E0500 Thyrotoxicosis with diffuse goiter without thyrotoxic crisis or storm: Secondary | ICD-10-CM | POA: Diagnosis not present

## 2015-10-25 LAB — TSH: TSH: 0.2 u[IU]/mL — AB (ref 0.35–4.50)

## 2015-10-25 LAB — T4, FREE: FREE T4: 1.09 ng/dL (ref 0.60–1.60)

## 2015-10-28 ENCOUNTER — Ambulatory Visit (INDEPENDENT_AMBULATORY_CARE_PROVIDER_SITE_OTHER): Payer: Medicare Other | Admitting: Endocrinology

## 2015-10-28 ENCOUNTER — Encounter: Payer: Self-pay | Admitting: *Deleted

## 2015-10-28 ENCOUNTER — Encounter: Payer: Self-pay | Admitting: Endocrinology

## 2015-10-28 VITALS — BP 108/68 | HR 73 | Temp 97.7°F | Resp 14 | Ht 62.0 in | Wt 144.2 lb

## 2015-10-28 DIAGNOSIS — Z8639 Personal history of other endocrine, nutritional and metabolic disease: Secondary | ICD-10-CM

## 2015-10-28 DIAGNOSIS — Z23 Encounter for immunization: Secondary | ICD-10-CM | POA: Diagnosis not present

## 2015-10-28 NOTE — Patient Instructions (Addendum)
Call if having weight loss,  palpitations, feeling tired, feeling warm, weakness in her legs, decreased appetite

## 2015-10-28 NOTE — Progress Notes (Signed)
Patient ID: Alexandria Petersen, female   DOB: 1940-03-18, 75 y.o.   MRN: [id number]                                                                                                               Reason for Appointment:  Hyperthyroidism, follow up   Referring physician: Diona Browner   History of Present Illness:   When she was first seen in 12/16 she had symptoms of weight loss,  palpitations, feeling tired, occasionally feeling warm and some weakness in her legs. Also was having decreased appetite.  She previously had suppressed TSH levels since at least 10/2013 Pretreatment labs showed increased free T4 and free T3 levels in 12/16  She was also found to have a goiter on exam initially.  Was started on Methimazole 5 mg twice a day in 12/2014  With this she was feeling better with her appetite and energy level    On her  visit in 1/17 her labs were  normal and she was told to continue the dose but she did not follow-up  Subsequently on follow-up her free T4 was significantly low with taking 5 mg twice a day of methimazole. This was stopped and in 4/17 her thyroid levels are quite normal without any medication  RECENT history: On her follow-up in 5/17 she was started back on 5 mg methimazole However in 6/17 her thyroid levels are still high and she was still complaining of some fatigue and weight loss, methimazole was increased to 10 mg Subsequently with free T4 low normal in July her dose was reduced to 7.5 mg  More recently her methimazole has been tapered off since her TSH was high in 9/17  Thyrotropin receptor antibody in July was 18.5, normal <16  She thinks she feels fairly good overall and her appetite is normal  Thyroid levels are back to normal although TSH is minimally suppressed   Wt Readings from Last 3 Encounters:  10/28/15 144 lb 3.2 oz (65.4 kg)  09/15/15 143 lb 9.6 oz (65.1 kg)  08/04/15 142 lb (64.4 kg)     Lab Results  Component Value Date   FREET4 1.09  10/25/2015   FREET4 0.67 09/10/2015   FREET4 0.74 07/30/2015   TSH 0.20 (L) 10/25/2015   TSH 6.60 (H) 09/10/2015   TSH 0.28 (L) 07/30/2015   Lab Results  Component Value Date   T3FREE 3.4 06/25/2015   T3FREE 4.5 (H) 05/25/2015       Past history: Several years ago the patient was given unknown thyroid medication which she took for about 4 years, initially apparently she felt less tired with this but does not know why this was stopped also and no records are available TSH level has been previously normal as far back as 2008     Medication List       Accurate as of 10/28/15  3:45 PM. Always use your most recent med list.          CENTRUM SILVER ADULT 50+ PO Take 1 tablet  by mouth daily.   GLUCOSAMINE CHOND COMPLEX/MSM Tabs Take 1,103 mg by mouth 2 (two) times daily.   losartan-hydrochlorothiazide 50-12.5 MG tablet Commonly known as:  HYZAAR TAKE 1 TABLET BY MOUTH EVERY DAY   methimazole 5 MG tablet Commonly known as:  TAPAZOLE TAKE 1 TABLET BY MOUTH TWICE A DAY   Omega 3 1000 MG Caps Take 1 capsule by mouth daily. [employer]   pravastatin 40 MG tablet Commonly known as:  PRAVACHOL TAKE 1 TABLET BY MOUTH DAILY           Past Medical History:  Diagnosis Date  . Hypertension     Past Surgical History:  Procedure Laterality Date  . SHOULDER SURGERY     right  . TUBAL LIGATION      Family History  Problem Relation Age of Onset  . Hypertension Mother   . Diabetes Mother   . Hypertension Father   . Diabetes Sister   . Diabetes Brother   . Hypothyroidism Daughter     Social History:  reports that she has never smoked. She has never used smokeless tobacco. She reports that she does not drink alcohol or use drugs.  Allergies:  Allergies  Allergen Reactions  . Penicillins     REACTION: hallucinations     Review of Systems  BP Readings from Last 3 Encounters:  10/28/15 108/68  09/15/15 136/78  08/04/15 (!) 152/70      Examination:   BP 108/68   Pulse 73   Temp 97.7 F (36.5 C)   Resp 14   Ht 5\' 2"  (1.575 m)   Wt 144 lb 3.2 oz (65.4 kg)   SpO2 97%   BMI 26.37 kg/m      She looks well Neck: The thyroid is Not clearly palpable  Neurological: Deep tendon reflexes at biceps are showing normal relaxation No peripheral edema    Assessment/Plan:   Hyperthyroidism,  from Graves' disease   She now appears to be in remission Has not been on methimazole for month and her levels are normal except for slightly low TSH  Will watch her again without any medications, discussed that she will call if she had a recurrence of the symptoms She will follow-up in another 12 weeks again  Patient Instructions  Call if having weight loss,  palpitations, feeling tired, feeling warm, weakness in her legs, decreased appetite  Influenza vaccine given  [facility] 10/28/2015, 3:45 PM   Note: This office note was prepared with Dragon voice recognition system technology. Any transcriptional errors that result from this process are unintentional.

## 2015-12-09 ENCOUNTER — Telehealth: Payer: Self-pay | Admitting: Family Medicine

## 2015-12-09 DIAGNOSIS — E7800 Pure hypercholesterolemia, unspecified: Secondary | ICD-10-CM

## 2015-12-09 DIAGNOSIS — R7303 Prediabetes: Secondary | ICD-10-CM

## 2015-12-09 DIAGNOSIS — M8589 Other specified disorders of bone density and structure, multiple sites: Secondary | ICD-10-CM

## 2015-12-09 NOTE — Telephone Encounter (Signed)
-----   Message from [name] sent at 11/30/2015 11:16 AM EST ----- Regarding: Lab orders for Friday, 12.1.17 Patient is scheduled for CPX labs, please order future labs, Thanks , Karna Christmas

## 2015-12-10 ENCOUNTER — Other Ambulatory Visit (INDEPENDENT_AMBULATORY_CARE_PROVIDER_SITE_OTHER): Payer: Medicare Other

## 2015-12-10 DIAGNOSIS — E7800 Pure hypercholesterolemia, unspecified: Secondary | ICD-10-CM

## 2015-12-10 DIAGNOSIS — R7303 Prediabetes: Secondary | ICD-10-CM

## 2015-12-10 LAB — LIPID PANEL
CHOLESTEROL: 182 mg/dL (ref 0–200)
HDL: 91.2 mg/dL (ref >39.00)
LDL CALC: 78 mg/dL (ref 0–99)
NonHDL: 91.1
Total CHOL/HDL Ratio: 2
Triglycerides: 65 mg/dL (ref 0.0–149.0)
VLDL: 13 mg/dL (ref 0.0–40.0)

## 2015-12-10 LAB — COMPREHENSIVE METABOLIC PANEL
ALBUMIN: 3.9 g/dL (ref 3.5–5.2)
ALK PHOS: 51 U/L (ref 39–117)
ALT: 19 U/L (ref 0–35)
AST: 18 U/L (ref 0–37)
BILIRUBIN TOTAL: 1.2 mg/dL (ref 0.2–1.2)
BUN: 21 mg/dL (ref 6–23)
CHLORIDE: 104 meq/L (ref 96–112)
CO2: 30 mEq/L (ref 19–32)
CREATININE: 0.83 mg/dL (ref 0.40–1.20)
Calcium: 9.8 mg/dL (ref 8.4–10.5)
GFR: 85.72 mL/min (ref >60.00)
Glucose, Bld: 99 mg/dL (ref 70–99)
Potassium: 3.6 mEq/L (ref 3.5–5.1)
SODIUM: 141 meq/L (ref 135–145)
TOTAL PROTEIN: 7.1 g/dL (ref 6.0–8.3)

## 2015-12-10 LAB — HEMOGLOBIN A1C: HEMOGLOBIN A1C: 5.7 % (ref 4.6–6.5)

## 2015-12-13 ENCOUNTER — Other Ambulatory Visit: Payer: Self-pay | Admitting: Family Medicine

## 2015-12-13 DIAGNOSIS — Z1231 Encounter for screening mammogram for malignant neoplasm of breast: Secondary | ICD-10-CM

## 2015-12-17 ENCOUNTER — Encounter: Payer: Medicare Other | Admitting: Family Medicine

## 2015-12-21 ENCOUNTER — Ambulatory Visit (INDEPENDENT_AMBULATORY_CARE_PROVIDER_SITE_OTHER)
Admission: RE | Admit: 2015-12-21 | Discharge: 2015-12-21 | Disposition: A | Discharge status: Home / Self Care | Payer: Medicare Other | Source: Ambulatory Visit | Attending: Family Medicine | Admitting: Family Medicine

## 2015-12-21 ENCOUNTER — Ambulatory Visit
Admission: RE | Admit: 2015-12-21 | Discharge: 2015-12-21 | Disposition: A | Discharge status: Home / Self Care | Payer: Medicare Other | Source: Ambulatory Visit | Attending: Family Medicine | Admitting: Family Medicine

## 2015-12-21 ENCOUNTER — Ambulatory Visit (INDEPENDENT_AMBULATORY_CARE_PROVIDER_SITE_OTHER): Payer: Medicare Other | Admitting: Family Medicine

## 2015-12-21 ENCOUNTER — Encounter: Payer: Self-pay | Admitting: Family Medicine

## 2015-12-21 VITALS — BP 122/60 | HR 95 | Temp 98.3°F | Ht 61.5 in | Wt 143.5 lb

## 2015-12-21 DIAGNOSIS — I10 Essential (primary) hypertension: Secondary | ICD-10-CM

## 2015-12-21 DIAGNOSIS — G8929 Other chronic pain: Secondary | ICD-10-CM

## 2015-12-21 DIAGNOSIS — M179 Osteoarthritis of knee, unspecified: Secondary | ICD-10-CM | POA: Diagnosis not present

## 2015-12-21 DIAGNOSIS — M25562 Pain in left knee: Principal | ICD-10-CM

## 2015-12-21 DIAGNOSIS — M25561 Pain in right knee: Secondary | ICD-10-CM | POA: Diagnosis not present

## 2015-12-21 DIAGNOSIS — M170 Bilateral primary osteoarthritis of knee: Secondary | ICD-10-CM | POA: Diagnosis not present

## 2015-12-21 DIAGNOSIS — M25461 Effusion, right knee: Secondary | ICD-10-CM | POA: Diagnosis not present

## 2015-12-21 DIAGNOSIS — E7800 Pure hypercholesterolemia, unspecified: Secondary | ICD-10-CM | POA: Diagnosis not present

## 2015-12-21 DIAGNOSIS — R7303 Prediabetes: Secondary | ICD-10-CM

## 2015-12-21 DIAGNOSIS — Z0000 Encounter for general adult medical examination without abnormal findings: Secondary | ICD-10-CM

## 2015-12-21 DIAGNOSIS — Z1211 Encounter for screening for malignant neoplasm of colon: Secondary | ICD-10-CM

## 2015-12-21 MED ORDER — DICLOFENAC SODIUM 1 % TD GEL: 2.0000 g | Freq: Four times a day (QID) | TRANSDERMAL | 11 refills | Status: DC

## 2015-12-21 NOTE — Assessment & Plan Note (Signed)
Will eval with X-rays for amount of arthritis.  Will try course of voltaren gel on knees. She is hesitant about oral medication or injected steroids.

## 2015-12-21 NOTE — Progress Notes (Signed)
Pre visit review using our clinic review tool, if applicable. No additional management support is needed unless otherwise documented below in the visit note. 

## 2015-12-21 NOTE — Assessment & Plan Note (Signed)
Stable control. 

## 2015-12-21 NOTE — Progress Notes (Signed)
Subjective:    Patient ID: Alexandria Petersen, female    DOB: [birth year], 75 y.o.   MRN: [id number]  HPI  75 year old female presents for AMW.   I have personally reviewed the Medicare Annual Wellness questionnaire and have noted 1. The patient's medical and social history 2. Their use of alcohol, tobacco or illicit drugs 3. Their current medications and supplements 4. The patient's functional ability including ADL's, fall risks, home safety risks and hearing or visual             impairment. 5. Diet and physical activities 6. Evidence for depression or mood disorders 7.         Updated provider list Cognitive evaluation was performed and recorded on pt medicare questionnaire form. The patients weight, height, BMI and visual acuity have been recorded in the chart  I have made referrals, counseling and provided education to the patient based review of the above and I have provided the pt with a written personalized care plan for preventive services.   She has bilateral knee pain. Using linement. Stiffness. Pain worse when she up on her feet, after sitting a while. Bilateral knee swelling, no redness.  Tylenol helps some with pain.  Glucosamine did not help much. Aleve helped some but caused heart palp.  Prediabetes:  Lab Results  Component Value Date   HGBA1C 5.7 12/10/2015   Hypertension:   Well controlled on hyzaar  BP Readings from Last 3 Encounters:  12/21/15 122/60  10/28/15 108/68  09/15/15 136/78  Using medication without problems or lightheadedness:  Chest pain with exertion: Edema: Short of breath: Average home BPs: Other issues:   Followed by Dr, Dwyane Dee Lab Results  Component Value Date   TSH 0.20 (L) 10/25/2015    Wt Readings from Last 3 Encounters:  12/21/15 143 lb 8 oz (65.1 kg)  10/28/15 144 lb 3.2 oz (65.4 kg)  09/15/15 143 lb 9.6 oz (65.1 kg)   Body mass index is 26.68 kg/m.   Elevated Cholesterol:  At goal on pravachol. Lab Results    Component Value Date   CHOL 182 12/10/2015   HDL 91.20 12/10/2015   LDLCALC 78 12/10/2015   LDLDIRECT 90.7 10/29/2012   TRIG 65.0 12/10/2015   CHOLHDL 2 12/10/2015  Using medications without problems: Muscle aches:  Diet compliance: Healthy Exercise: none Other complaints:   Social History /Family History/Past Medical History reviewed and updated if needed.  Review of Systems  Constitutional: Negative for fatigue and fever.  HENT: Negative for congestion.   Eyes: Negative for pain.  Respiratory: Negative for cough and shortness of breath.   Cardiovascular: Negative for chest pain, palpitations and leg swelling.  Gastrointestinal: Negative for abdominal pain.  Genitourinary: Negative for dysuria and vaginal bleeding.  Musculoskeletal: Negative for back pain.  Neurological: Negative for syncope, light-headedness and headaches.  Psychiatric/Behavioral: Negative for dysphoric mood.       Objective:   Physical Exam  Constitutional: Vital signs are normal. She appears well-developed and well-nourished. She is cooperative.  Non-toxic appearance. She does not appear ill. No distress.  HENT:  Head: Normocephalic.  Right Ear: Hearing, tympanic membrane, external ear and ear canal normal.  Left Ear: Hearing, tympanic membrane, external ear and ear canal normal.  Nose: Nose normal.  Eyes: Conjunctivae, EOM and lids are normal. Pupils are equal, round, and reactive to light. Lids are everted and swept, no foreign bodies found.  Neck: Trachea normal and normal range of motion. Neck supple. Carotid bruit  is not present. No thyroid mass and no thyromegaly present.  Cardiovascular: Normal rate, regular rhythm, S1 normal, S2 normal, normal heart sounds and intact distal pulses.  Exam reveals no gallop.   No murmur heard. Pulmonary/Chest: Effort normal and breath sounds normal. No respiratory distress. She has no wheezes. She has no rhonchi. She has no rales.  Abdominal: Soft. Normal  appearance and bowel sounds are normal. She exhibits no distension, no fluid wave, no abdominal bruit and no mass. There is no hepatosplenomegaly. There is no tenderness. There is no rebound, no guarding and no CVA tenderness. No hernia.  Musculoskeletal:       Right shoulder: She exhibits swelling, deformity and pain. She exhibits no effusion.  Neg Mcmurray, crepitus bilaterally.  Lymphadenopathy:    She has no cervical adenopathy.    She has no axillary adenopathy.  Neurological: She is alert. She has normal strength. No cranial nerve deficit or sensory deficit.  Skin: Skin is warm, dry and intact. No rash noted.  Psychiatric: Her speech is normal and behavior is normal. Judgment normal. Her mood appears not anxious. Cognition and memory are normal. She does not exhibit a depressed mood.          Assessment & Plan:  The patient's preventative maintenance and recommended screening tests for an annual wellness exam were reviewed in full today. Brought up to date unless services declined.  Counselled on the importance of diet, exercise, and its role in overall health and mortality. The patient's FH and SH was reviewed, including their home life, tobacco status, and drug and alcohol status.   Vaccines: uptodate Pap/DVE:  No pap indicated Mammo:  scheduled Bone Density: due 2018 Colon: yearly stool cards Smoking Status: nonsmoker

## 2015-12-21 NOTE — Assessment & Plan Note (Signed)
Well controlled. Continue current medication.  

## 2015-12-21 NOTE — Patient Instructions (Addendum)
We will call with X-ray result.  Pick up stool cards on way out.  Start voltaren gell on both knees. Work on exercise as able, walking.

## 2015-12-21 NOTE — Assessment & Plan Note (Signed)
Well controlled. Continue current medication. Encouraged exercise, weight loss, healthy eating habits.  

## 2015-12-22 ENCOUNTER — Telehealth: Payer: Self-pay | Admitting: *Deleted

## 2015-12-22 NOTE — Telephone Encounter (Signed)
Received fax from [facility] requesting PA for Diclofenac Gel.  PA completed on CoverMyMeds.   Went for review.  Can take up to 72 hours for a response.

## 2015-12-23 NOTE — Telephone Encounter (Signed)
Diclofenac Gel PA approved through 01/08/2017.  [facility] notified via fax.  Left message for Alexandria Petersen letting her know the medication was approved.

## 2016-01-04 ENCOUNTER — Other Ambulatory Visit (INDEPENDENT_AMBULATORY_CARE_PROVIDER_SITE_OTHER): Payer: Medicare Other

## 2016-01-04 DIAGNOSIS — Z1211 Encounter for screening for malignant neoplasm of colon: Secondary | ICD-10-CM

## 2016-01-04 LAB — FECAL OCCULT BLOOD, IMMUNOCHEMICAL: Fecal Occult Bld: NEGATIVE

## 2016-01-07 ENCOUNTER — Telehealth: Payer: Self-pay

## 2016-01-07 NOTE — Telephone Encounter (Signed)
Left detailed message on machine regarding lab results.

## 2016-01-24 ENCOUNTER — Other Ambulatory Visit (INDEPENDENT_AMBULATORY_CARE_PROVIDER_SITE_OTHER): Payer: Medicare Other

## 2016-01-24 DIAGNOSIS — Z8639 Personal history of other endocrine, nutritional and metabolic disease: Secondary | ICD-10-CM

## 2016-01-24 LAB — T4, FREE: Free T4: 2.42 ng/dL — ABNORMAL HIGH (ref 0.60–1.60)

## 2016-01-24 LAB — TSH: TSH: 0.03 u[IU]/mL — ABNORMAL LOW (ref 0.35–4.50)

## 2016-01-24 LAB — T3, FREE: T3, Free: 5 pg/mL — ABNORMAL HIGH (ref 2.3–4.2)

## 2016-01-25 ENCOUNTER — Ambulatory Visit
Admission: RE | Admit: 2016-01-25 | Discharge: 2016-01-25 | Disposition: A | Discharge status: Home / Self Care | Payer: Medicare Other | Source: Ambulatory Visit | Attending: Family Medicine | Admitting: Family Medicine

## 2016-01-25 DIAGNOSIS — Z1231 Encounter for screening mammogram for malignant neoplasm of breast: Secondary | ICD-10-CM | POA: Diagnosis not present

## 2016-01-27 ENCOUNTER — Other Ambulatory Visit: Payer: Self-pay | Admitting: Family Medicine

## 2016-01-27 ENCOUNTER — Ambulatory Visit: Payer: Medicare Other | Admitting: Endocrinology

## 2016-02-01 ENCOUNTER — Encounter: Payer: Self-pay | Admitting: Endocrinology

## 2016-02-01 ENCOUNTER — Ambulatory Visit (INDEPENDENT_AMBULATORY_CARE_PROVIDER_SITE_OTHER): Payer: Medicare Other | Admitting: Endocrinology

## 2016-02-01 VITALS — BP 148/64 | HR 94 | Ht 64.0 in | Wt 145.0 lb

## 2016-02-01 DIAGNOSIS — E0500 Thyrotoxicosis with diffuse goiter without thyrotoxic crisis or storm: Secondary | ICD-10-CM | POA: Diagnosis not present

## 2016-02-01 MED ORDER — METHIMAZOLE 5 MG PO TABS: 5.0000 mg | ORAL_TABLET | Freq: Two times a day (BID) | ORAL | 1 refills | Status: DC

## 2016-02-01 NOTE — Progress Notes (Signed)
Patient ID: Alexandria Petersen, female   DOB: [birth year], 76 y.o.   MRN: [id number]                                                                                                               Reason for Appointment:  Hyperthyroidism, follow up   Referring physician: Diona Browner   History of Present Illness:   Prior history: When she was first seen in 12/16 she had symptoms of weight loss,  palpitations, feeling tired, occasionally feeling warm and some weakness in her legs. Also was having decreased appetite.  She previously had suppressed TSH levels since at least 10/2013 Pretreatment labs showed increased free T4 and free T3 levels in 12/16  She was also found to have a goiter on exam initially.  Was started on Methimazole 5 mg twice a day in 12/2014  With this she was feeling better with her appetite and energy level    On her  visit in 1/17 her labs were  normal and she was told to continue the dose but she did not follow-up  Subsequently on follow-up her free T4 was significantly low with taking 5 mg twice a day of methimazole. This was stopped and in 4/17 her thyroid levels are quite normal without any medication On her follow-up in 5/17 she was started back on 5 mg methimazole  RECENT history:  Her methimazole was tapered off and stopped in 09/2015 when her free T4 was low normal Follow-up in October indicated her thyroid levels were near normal with TSH 0.2 but normal free T4 She was maintained of treatment  She is now coming back for follow-up She said that for about a couple of weeks she has noticed some palpitations occurring again and some fatigue However does not have any change in appetite or weight  Thyroid levels are back in the hyperthyroid range   Wt Readings from Last 3 Encounters:  02/01/16 145 lb (65.8 kg)  12/21/15 143 lb 8 oz (65.1 kg)  10/28/15 144 lb 3.2 oz (65.4 kg)     Lab Results  Component Value Date   FREET4 2.42 (H) 01/24/2016   FREET4 1.09  10/25/2015   FREET4 0.67 09/10/2015   TSH 0.03 (L) 01/24/2016   TSH 0.20 (L) 10/25/2015   TSH 6.60 (H) 09/10/2015   Lab Results  Component Value Date   T3FREE 5.0 (H) 01/24/2016   T3FREE 3.4 06/25/2015       Past history: Several years ago the patient was given unknown thyroid medication which she took for about 4 years, initially apparently she felt less tired with this but does not know why this was stopped also and no records are available TSH level has been previously normal as far back as 2008   Allergies as of 02/01/2016      Reactions   Penicillins    REACTION: hallucinations      Medication List       Accurate as of 02/01/16  2:58 PM. Always use your most recent med list.  CENTRUM SILVER ADULT 50+ PO Take 1 tablet by mouth daily.   diclofenac sodium 1 % Gel Commonly known as:  VOLTAREN Apply 2 g topically 4 (four) times daily.   losartan-hydrochlorothiazide 50-12.5 MG tablet Commonly known as:  HYZAAR TAKE 1 TABLET BY MOUTH EVERY DAY   methimazole 5 MG tablet Commonly known as:  TAPAZOLE Take 1 tablet (5 mg total) by mouth 2 (two) times daily.   Omega 3 1000 MG Caps Take 1 capsule by mouth daily. [employer]   pravastatin 40 MG tablet Commonly known as:  PRAVACHOL TAKE 1 TABLET BY MOUTH DAILY           Past Medical History:  Diagnosis Date  . Hypertension     Past Surgical History:  Procedure Laterality Date  . SHOULDER SURGERY     right  . TUBAL LIGATION      Family History  Problem Relation Age of Onset  . Hypertension Mother   . Diabetes Mother   . Hypertension Father   . Diabetes Sister   . Diabetes Brother   . Hypothyroidism Daughter     Social History:  reports that she has never smoked. She has never used smokeless tobacco. She reports that she does not drink alcohol or use drugs.  Allergies:  Allergies  Allergen Reactions  . Penicillins     REACTION: hallucinations     Review of Systems  Blood  pressure history:  BP Readings from Last 3 Encounters:  02/01/16 (!) 148/64  12/21/15 122/60  10/28/15 108/68    RASH: She is asking about a rash on her extremities for 1-2 months, does not have much itching.  Has not had this before No hives    Examination:   BP (!) 148/64   Pulse 94   Ht 5\' 4"  (1.626 m)   Wt 145 lb (65.8 kg)   SpO2 97%   BMI 24.89 kg/m    Neck: The thyroid is enlarged about 2-2.5 times normal, more on the right Heart rate is regular, 92, no abnormal heart sounds Neurological: No tremor Deep tendon reflexes at biceps are showing brisk relaxation No peripheral edema She has diffuse macular slightly raised pigmented areas on her legs, forearms and lower back, the macules are mostly about 1 cm in size or less   Assessment/Plan:   Hyperthyroidism,  from Graves' disease   She has had a recurrence of her hyperthyroidism This appears to be persistent condition and she has previously been treated since 12/2014 She has fairly significant hyperthyroidism and her free T4 is higher than in 2016 Her thyroid is also enlarged recently  Discussed that most likely since she will not have permanent remission with methimazole she will need to do I-131 treatment  Discussed in detail the benefits of I-131, how this is done and the timeline of doing to test and using antithyroid drugs Handout on I-131 given She will start back on 5 mg methimazole for 4 weeks and then have her I-131 uptake in treatment  She will follow-up in another 6-7 weeks again  RASH: She has pigmented macular rash which is relatively diffuse but mostly on extremities and lower back Unlikely to be related to her Graves' disease and she will need to follow-up with PCP on this   Patient Instructions  Restart methimazole 5 mg twice a day  For the radioactive iodine treatment you will need to have a test dose done first. We will plan to do this in about a  month  A week before this test dose is given  please STOP the methimazole  You will then be scheduled for the TREATMENT dose which is a single capsule.  When this is done then 3 days later start the methimazole 5 mg twice a day again until you are next visit    Counseling time on subjects discussed above is over 50% of today's 25 minute visit   Alexandria Petersen 02/01/2016, 2:58 PM   Note: This office note was prepared with Estate agent. Any transcriptional errors that result from this process are unintentional.

## 2016-02-01 NOTE — Patient Instructions (Signed)
Restart methimazole 5 mg twice a day  For the radioactive iodine treatment you will need to have a test dose done first. We will plan to do this in about a month  A week before this test dose is given please STOP the methimazole  You will then be scheduled for the TREATMENT dose which is a single capsule.  When this is done then 3 days later start the methimazole 5 mg twice a day again until you are next visit

## 2016-02-04 ENCOUNTER — Encounter: Payer: Self-pay | Admitting: Family Medicine

## 2016-02-04 ENCOUNTER — Ambulatory Visit (INDEPENDENT_AMBULATORY_CARE_PROVIDER_SITE_OTHER): Payer: Medicare Other | Admitting: Family Medicine

## 2016-02-04 DIAGNOSIS — R21 Rash and other nonspecific skin eruption: Secondary | ICD-10-CM

## 2016-02-04 MED ORDER — TRIAMCINOLONE ACETONIDE 0.5 % EX CREA: 1.0000 "application " | TOPICAL_CREAM | Freq: Two times a day (BID) | CUTANEOUS | 0 refills | Status: DC

## 2016-02-04 NOTE — Patient Instructions (Signed)
Will try treatment with topical steroid x 2 weeks . If not improving consider biopsy here or referral  to derm.

## 2016-02-04 NOTE — Assessment & Plan Note (Signed)
Hyperpigmented with scale.. ? Inflammatory from allergic reaction.  Not clearly fungal infection or bacteria infection.  No red flags.  Will try treatment with topical steroid x 2 weeks . If not improving consider biopsy here or referral  to derm.

## 2016-02-04 NOTE — Progress Notes (Signed)
Pre visit review using our clinic review tool, if applicable. No additional management support is needed unless otherwise documented below in the visit note. 

## 2016-02-04 NOTE — Progress Notes (Signed)
   Subjective:    Patient ID: Alexandria Petersen, female    DOB: 1940/12/26, 76 y.o.   MRN: [id number]  HPI  76 year old female presents with 1 month of new rash. It is itchy.  On both legs and lower back and arms Hyperpigmented spots with flake.  No new med, no new detergents, no new lotions. She does use a lot of different kinds of lotions for dry skin.  She has not tried anything other than vaseline on the rash. No fever, no SOB, no lip swelling.   Review of Systems  Constitutional: Negative for fatigue and fever.  HENT: Negative for ear pain.   Eyes: Negative for pain.  Respiratory: Negative for chest tightness and shortness of breath.   Cardiovascular: Negative for chest pain, palpitations and leg swelling.  Gastrointestinal: Negative for abdominal pain.  Genitourinary: Negative for dysuria.       Objective:   Physical Exam  Constitutional: Vital signs are normal. She appears well-developed and well-nourished. She is cooperative.  Non-toxic appearance. She does not appear ill. No distress.  HENT:  Head: Normocephalic.  Right Ear: Hearing, tympanic membrane, external ear and ear canal normal. Tympanic membrane is not erythematous, not retracted and not bulging.  Left Ear: Hearing, tympanic membrane, external ear and ear canal normal. Tympanic membrane is not erythematous, not retracted and not bulging.  Nose: No mucosal edema or rhinorrhea. Right sinus exhibits no maxillary sinus tenderness and no frontal sinus tenderness. Left sinus exhibits no maxillary sinus tenderness and no frontal sinus tenderness.  Mouth/Throat: Uvula is midline, oropharynx is clear and moist and mucous membranes are normal.  Eyes: Conjunctivae, EOM and lids are normal. Pupils are equal, round, and reactive to light. Lids are everted and swept, no foreign bodies found.  Neck: Trachea normal and normal range of motion. Neck supple. Carotid bruit is not present. No thyroid mass and no thyromegaly  present.  Cardiovascular: Normal rate, regular rhythm, S1 normal, S2 normal, normal heart sounds, intact distal pulses and normal pulses.  Exam reveals no gallop and no friction rub.   No murmur heard. Pulmonary/Chest: Effort normal and breath sounds normal. No tachypnea. No respiratory distress. She has no decreased breath sounds. She has no wheezes. She has no rhonchi. She has no rales.  Abdominal: Soft. Normal appearance and bowel sounds are normal. There is no tenderness.  Neurological: She is alert.  Skin: Skin is warm, dry and intact. Rash noted. No petechiae and no purpura noted. Rash is not nodular, not pustular and not vesicular.  Hyperpigmented macules and plaques, well demarcated on arms legs torso and back.  no oral lesions, no lesions on palms.  Some lesions with white flake, some with slight reddish tinge.  Psychiatric: Her speech is normal and behavior is normal. Judgment and thought content normal. Her mood appears not anxious. Cognition and memory are normal. She does not exhibit a depressed mood.          Assessment & Plan:

## 2016-02-09 ENCOUNTER — Telehealth: Payer: Self-pay | Admitting: Endocrinology

## 2016-02-09 ENCOUNTER — Encounter (HOSPITAL_COMMUNITY)
Admission: RE | Admit: 2016-02-09 | Discharge: 2016-02-09 | Disposition: A | Discharge status: Home / Self Care | Payer: Medicare Other | Source: Ambulatory Visit | Attending: Endocrinology | Admitting: Endocrinology

## 2016-02-09 ENCOUNTER — Encounter (HOSPITAL_COMMUNITY): Payer: Self-pay

## 2016-02-09 DIAGNOSIS — E0500 Thyrotoxicosis with diffuse goiter without thyrotoxic crisis or storm: Secondary | ICD-10-CM

## 2016-02-09 NOTE — Telephone Encounter (Signed)
Pt went to the hospital today for her Thryoid procedure, and was told she would need to reschedule, the patient and her daughter are unsure of why this has happened and would like to know what is going on, please advise ASAP.

## 2016-02-09 NOTE — Telephone Encounter (Signed)
This patient was supposed to go in and have her Thyroid uptake/scan today and when she got there she was told that she would need to reschedule.  The patient is concerned that something is wrong and would like to clarify what happened with her appointment.  I have sent a message to both Dr. Dwyane Dee and his nurse, but also wanted to check with you to see if you had any information.  If you could call patient with any help, that would be great. Thank you!

## 2016-02-09 NOTE — Telephone Encounter (Signed)
Pt would really like a call back about all of this confusion today, she just wants to speak with someone here to make sure everything is okay.  Call back around 4:00 if possible.

## 2016-02-10 ENCOUNTER — Encounter (HOSPITAL_COMMUNITY): Payer: Medicare Other

## 2016-02-10 NOTE — Telephone Encounter (Signed)
Someone will have to find out from nuclear medicine what happened

## 2016-02-15 NOTE — Telephone Encounter (Signed)
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  However need to make sure she is rescheduled

## 2016-02-18 ENCOUNTER — Encounter: Payer: Self-pay | Admitting: Family Medicine

## 2016-02-18 ENCOUNTER — Ambulatory Visit (INDEPENDENT_AMBULATORY_CARE_PROVIDER_SITE_OTHER): Payer: Medicare Other | Admitting: Family Medicine

## 2016-02-18 VITALS — BP 154/63 | HR 80 | Temp 98.2°F | Ht 61.5 in | Wt 143.0 lb

## 2016-02-18 DIAGNOSIS — R21 Rash and other nonspecific skin eruption: Secondary | ICD-10-CM | POA: Diagnosis not present

## 2016-02-18 MED ORDER — PREDNISONE 10 MG PO TABS: ORAL_TABLET | ORAL | 0 refills | Status: DC

## 2016-02-18 NOTE — Progress Notes (Signed)
   Subjective:    Patient ID: Alexandria Petersen, female    DOB: 1940-11-11, 76 y.o.   MRN: [id number]  HPI    76 year old female presents for follow up rash.   At last OV on 1/26..felt likely  Due to  Allergic reaction.  Treated with topical steroids.   Today she reports  She has maybe a little improvement in trash in some areas but still new lesions popping out on arms and legs.  She has been using the topical steroid twice daily.  Minimally itchy.  No fever, no fatigue., no flu like symptoms.   Review of Systems  Constitutional: Negative for fatigue and fever.  HENT: Negative for ear pain.   Eyes: Negative for pain.  Respiratory: Negative for chest tightness and shortness of breath.   Cardiovascular: Negative for chest pain, palpitations and leg swelling.  Gastrointestinal: Negative for abdominal pain.  Genitourinary: Negative for dysuria.       Objective:   Physical Exam  Constitutional: Vital signs are normal. She appears well-developed and well-nourished. She is cooperative.  Non-toxic appearance. She does not appear ill. No distress.  HENT:  Head: Normocephalic.  Right Ear: Hearing, tympanic membrane, external ear and ear canal normal. Tympanic membrane is not erythematous, not retracted and not bulging.  Left Ear: Hearing, tympanic membrane, external ear and ear canal normal. Tympanic membrane is not erythematous, not retracted and not bulging.  Nose: No mucosal edema or rhinorrhea. Right sinus exhibits no maxillary sinus tenderness and no frontal sinus tenderness. Left sinus exhibits no maxillary sinus tenderness and no frontal sinus tenderness.  Mouth/Throat: Uvula is midline, oropharynx is clear and moist and mucous membranes are normal.  Eyes: Conjunctivae, EOM and lids are normal. Pupils are equal, round, and reactive to light. Lids are everted and swept, no foreign bodies found.  Neck: Trachea normal and normal range of motion. Neck supple. Carotid bruit is not  present. No thyroid mass and no thyromegaly present.  Cardiovascular: Normal rate, regular rhythm, S1 normal, S2 normal, normal heart sounds, intact distal pulses and normal pulses.  Exam reveals no gallop and no friction rub.   No murmur heard. Pulmonary/Chest: Effort normal and breath sounds normal. No tachypnea. No respiratory distress. She has no decreased breath sounds. She has no wheezes. She has no rhonchi. She has no rales.  Abdominal: Soft. Normal appearance and bowel sounds are normal. There is no tenderness.  Neurological: She is alert.  Skin: Skin is warm, dry and intact. Rash noted. No petechiae and no purpura noted. Rash is not nodular, not pustular and not vesicular.  Hyperpigmented macules and plaques, well demarcated on arms legs torso and back.  no oral lesions, no lesions on palms.  Some lesions with white flake, some with slight reddish tinge.  Psychiatric: Her speech is normal and behavior is normal. Judgment and thought content normal. Her mood appears not anxious. Cognition and memory are normal. She does not exhibit a depressed mood.          Assessment & Plan:

## 2016-02-18 NOTE — Assessment & Plan Note (Signed)
Minimal improvement with topical steroid. Will try trial of pred taper but refer to derm to identify and treat further.  No clear sign of infection.

## 2016-02-18 NOTE — Progress Notes (Signed)
Pre visit review using our clinic review tool, if applicable. No additional management support is needed unless otherwise documented below in the visit note. 

## 2016-02-18 NOTE — Telephone Encounter (Signed)
She is scheduled to come back in March

## 2016-02-18 NOTE — Patient Instructions (Addendum)
Complete a course of oral steroid x 6 days. Follow  With topical steroid until referral appt.  Stop at front desk to get a referral to dermatology.

## 2016-03-01 ENCOUNTER — Other Ambulatory Visit: Payer: Self-pay

## 2016-03-01 DIAGNOSIS — R238 Other skin changes: Secondary | ICD-10-CM | POA: Diagnosis not present

## 2016-03-01 DIAGNOSIS — L309 Dermatitis, unspecified: Secondary | ICD-10-CM | POA: Diagnosis not present

## 2016-03-01 DIAGNOSIS — L308 Other specified dermatitis: Secondary | ICD-10-CM | POA: Diagnosis not present

## 2016-03-06 DIAGNOSIS — H25813 Combined forms of age-related cataract, bilateral: Secondary | ICD-10-CM | POA: Diagnosis not present

## 2016-03-15 ENCOUNTER — Other Ambulatory Visit: Payer: Self-pay | Admitting: Family Medicine

## 2016-03-17 ENCOUNTER — Other Ambulatory Visit (INDEPENDENT_AMBULATORY_CARE_PROVIDER_SITE_OTHER): Payer: Medicare Other

## 2016-03-17 DIAGNOSIS — E0500 Thyrotoxicosis with diffuse goiter without thyrotoxic crisis or storm: Secondary | ICD-10-CM | POA: Diagnosis not present

## 2016-03-17 LAB — T4, FREE: Free T4: 0.88 ng/dL (ref 0.60–1.60)

## 2016-03-21 ENCOUNTER — Ambulatory Visit: Payer: Medicare Other | Admitting: Endocrinology

## 2016-03-24 ENCOUNTER — Encounter: Payer: Self-pay | Admitting: Endocrinology

## 2016-03-24 ENCOUNTER — Ambulatory Visit (INDEPENDENT_AMBULATORY_CARE_PROVIDER_SITE_OTHER): Payer: Medicare Other | Admitting: Endocrinology

## 2016-03-24 VITALS — BP 144/76 | HR 78 | Ht 62.0 in | Wt 146.0 lb

## 2016-03-24 DIAGNOSIS — E0500 Thyrotoxicosis with diffuse goiter without thyrotoxic crisis or storm: Secondary | ICD-10-CM

## 2016-03-24 NOTE — Patient Instructions (Signed)
We will send a referral again to get the radioactive iodine uptake test done  STOP the methimazole tablet 5 days before you are going to get this test done  After this test he will go back again for the radioactive iodine treatment and continue to not take the methimazole  RESTART the methimazole 3 days after the actual radioactive iodine treatment is completed

## 2016-03-24 NOTE — Progress Notes (Signed)
Patient ID: Alexandria Petersen, female   DOB: Jun 01, 1940, 76 y.o.   MRN: [id number]                                                                                                               Reason for Appointment:  Hyperthyroidism, follow up   Referring physician: Diona Browner   History of Present Illness:   Prior history: When she was first seen in 12/16 she had symptoms of weight loss,  palpitations, feeling tired, occasionally feeling warm and some weakness in her legs. Also was having decreased appetite.  She previously had suppressed TSH levels since at least 10/2013 Pretreatment labs showed increased free T4 and free T3 levels in 12/16  She was also found to have a goiter on exam initially.  Was started on Methimazole 5 mg twice a day in 12/2014  With this she was feeling better with her appetite and energy level    On her  visit in 1/17 her labs were  normal and she was told to continue the dose but she did not follow-up  Subsequently on follow-up her free T4 was significantly low with taking 5 mg twice a day of methimazole. This was stopped and in 4/17 her thyroid levels are quite normal without any medication On her follow-up in 5/17 she was started back on 5 mg methimazole  RECENT history:  Her methimazole was tapered off and stopped in 09/2015 when her free T4 was low normal However without her methimazole she had a recurrence of her hyperthyroidism in 1/18; she was having symptoms of palpitations and fatigue Free T4 was 2.42  Although she was recommended I-131 uptake and treatment she apparently had not stopped her methimazole when she went for her appointment for the I-131 uptake More recently she has had less fatigue, no palpitations and no change in appetite Weight has improved Thyroid levels are back in the normal range with the free T4   Wt Readings from Last 3 Encounters:  03/24/16 146 lb (66.2 kg)  02/18/16 143 lb (64.9 kg)  02/04/16 143 lb 4 oz (65 kg)      Lab Results  Component Value Date   FREET4 0.88 03/17/2016   FREET4 2.42 (H) 01/24/2016   FREET4 1.09 10/25/2015   TSH 0.03 (L) 01/24/2016   TSH 0.20 (L) 10/25/2015   TSH 6.60 (H) 09/10/2015   Lab Results  Component Value Date   T3FREE 5.0 (H) 01/24/2016   T3FREE 3.4 06/25/2015      Lab Results  Component Value Date   THYROTRECAB 18.5 (H) 07/30/2015     Past history: Several years ago the patient was given unknown thyroid medication which she took for about 4 years, initially apparently she felt less tired with this but does not know why this was stopped also and no records are available TSH level has been previously normal as far back as 2008   Allergies as of 03/24/2016      Reactions   Penicillins    REACTION: hallucinations  Medication List       Accurate as of 03/24/16 11:59 PM. Always use your most recent med list.          CENTRUM SILVER ADULT 50+ PO Take 1 tablet by mouth daily.   clobetasol cream 0.05 % Commonly known as:  TEMOVATE APPLY TO AFFECTED AREA TWICE A DAY   diclofenac sodium 1 % Gel Commonly known as:  VOLTAREN Apply 2 g topically 4 (four) times daily.   losartan-hydrochlorothiazide 50-12.5 MG tablet Commonly known as:  HYZAAR TAKE 1 TABLET BY MOUTH EVERY DAY   methimazole 5 MG tablet Commonly known as:  TAPAZOLE Take 1 tablet (5 mg total) by mouth 2 (two) times daily.   Omega 3 1000 MG Caps Take 1 capsule by mouth daily. [employer]   pravastatin 40 MG tablet Commonly known as:  PRAVACHOL TAKE 1 TABLET BY MOUTH DAILY   predniSONE 10 MG tablet Commonly known as:  DELTASONE 3 tabs by mouth daily x 3 days, then 2 tabs by mouth daily x 2 days then 1 tab by mouth daily x 2 days   triamcinolone cream 0.5 % Commonly known as:  KENALOG Apply 1 application topically 2 (two) times daily.           Past Medical History:  Diagnosis Date  . Hypertension     Past Surgical History:  Procedure Laterality  Date  . SHOULDER SURGERY     right  . TUBAL LIGATION      Family History  Problem Relation Age of Onset  . Hypertension Mother   . Diabetes Mother   . Hypertension Father   . Diabetes Sister   . Diabetes Brother   . Hypothyroidism Daughter     Social History:  reports that she has never smoked. She has never used smokeless tobacco. She reports that she does not drink alcohol or use drugs.  Allergies:  Allergies  Allergen Reactions  . Penicillins     REACTION: hallucinations     Review of Systems  Blood pressure history:  BP Readings from Last 3 Encounters:  03/24/16 (!) 144/76  02/18/16 (!) 154/63  02/04/16 130/62      Examination:   BP (!) 144/76   Pulse 78   Ht 5\' 2"  (1.575 m)   Wt 146 lb (66.2 kg)   SpO2 98%   BMI 26.70 kg/m    Neck: The thyroid is enlarged about 2 times normal, smaller on the left Neurological: No tremor Deep tendon reflexes at biceps are showing normal relaxation No peripheral edema    Assessment/Plan:   Hyperthyroidism,  from Graves' disease   She has Back on her methimazole for the recurrence of her hyperthyroidism since 1/18 She is doing symptomatic the better and her free T4 is quite normal She is agreeable to the I-131 treatment  Again discussed the procedure for doing the I-131 treatment and how this will work She will reduce her methimazole down to 5 mg daily since her levels have come down significantly with 10 mg  Given her detailed instructions on when to stop her methimazole and this was reviewed on her printout   Patient Instructions  We will send a referral again to get the radioactive iodine uptake test done  STOP the methimazole tablet 5 days before you are going to get this test done  After this test he will go back again for the radioactive iodine treatment and continue to not take the methimazole  RESTART the methimazole  3 days after the actual radioactive iodine treatment is completed       [facility] 03/26/2016, 6:39 PM   Note: This office note was prepared with Dragon voice recognition system technology. Any transcriptional errors that result from this process are unintentional.

## 2016-03-27 ENCOUNTER — Other Ambulatory Visit: Payer: Self-pay | Admitting: Endocrinology

## 2016-04-06 DIAGNOSIS — L309 Dermatitis, unspecified: Secondary | ICD-10-CM | POA: Diagnosis not present

## 2016-04-11 ENCOUNTER — Ambulatory Visit (HOSPITAL_COMMUNITY)
Admission: RE | Admit: 2016-04-11 | Discharge: 2016-04-11 | Disposition: A | Discharge status: Home / Self Care | Payer: Medicare Other | Source: Ambulatory Visit | Attending: Endocrinology | Admitting: Endocrinology

## 2016-04-11 DIAGNOSIS — E0590 Thyrotoxicosis, unspecified without thyrotoxic crisis or storm: Secondary | ICD-10-CM | POA: Insufficient documentation

## 2016-04-11 DIAGNOSIS — E0500 Thyrotoxicosis with diffuse goiter without thyrotoxic crisis or storm: Secondary | ICD-10-CM

## 2016-04-12 ENCOUNTER — Encounter (HOSPITAL_COMMUNITY)
Admission: RE | Admit: 2016-04-12 | Discharge: 2016-04-12 | Disposition: A | Discharge status: Home / Self Care | Payer: Medicare Other | Source: Ambulatory Visit | Attending: Endocrinology | Admitting: Endocrinology

## 2016-04-12 DIAGNOSIS — E0500 Thyrotoxicosis with diffuse goiter without thyrotoxic crisis or storm: Secondary | ICD-10-CM | POA: Diagnosis present

## 2016-04-12 DIAGNOSIS — E0590 Thyrotoxicosis, unspecified without thyrotoxic crisis or storm: Secondary | ICD-10-CM | POA: Diagnosis not present

## 2016-04-12 DIAGNOSIS — E049 Nontoxic goiter, unspecified: Secondary | ICD-10-CM | POA: Diagnosis not present

## 2016-04-12 MED ORDER — SODIUM IODIDE I 131 CAPSULE
10.0000 | Freq: Once | INTRAVENOUS | Status: AC | PRN
  Administered 2016-04-11: 10 via ORAL

## 2016-04-13 ENCOUNTER — Other Ambulatory Visit: Payer: Self-pay | Admitting: Endocrinology

## 2016-04-13 DIAGNOSIS — E0500 Thyrotoxicosis with diffuse goiter without thyrotoxic crisis or storm: Secondary | ICD-10-CM

## 2016-04-13 NOTE — Progress Notes (Signed)
Please let patient know that the radioactive iodine treatment has been ordered and she will be called for this, keep her follow-up appointment as scheduled

## 2016-04-14 ENCOUNTER — Telehealth: Payer: Self-pay | Admitting: Endocrinology

## 2016-04-14 NOTE — Telephone Encounter (Signed)
Spoke with patient and she stated an understanding

## 2016-04-14 NOTE — Telephone Encounter (Signed)
Pt called in for her results of the RAI treatment, she said she needs to know what she needs to do about taking her medications so please call back as soon as possible.

## 2016-04-14 NOTE — Telephone Encounter (Signed)
She had been given written instructions on her last visit and she needs to look at that.  Hopefully she will be scheduled next week and can stay off her medication She goes back to the methimazole 3 days after she has the treatment dose

## 2016-04-18 DIAGNOSIS — L309 Dermatitis, unspecified: Secondary | ICD-10-CM | POA: Diagnosis not present

## 2016-05-02 ENCOUNTER — Encounter (HOSPITAL_COMMUNITY)
Admission: RE | Admit: 2016-05-02 | Discharge: 2016-05-02 | Disposition: A | Discharge status: Home / Self Care | Payer: Medicare Other | Source: Ambulatory Visit | Attending: Endocrinology | Admitting: Endocrinology

## 2016-05-02 DIAGNOSIS — E0590 Thyrotoxicosis, unspecified without thyrotoxic crisis or storm: Secondary | ICD-10-CM | POA: Diagnosis not present

## 2016-05-02 DIAGNOSIS — E0500 Thyrotoxicosis with diffuse goiter without thyrotoxic crisis or storm: Secondary | ICD-10-CM | POA: Insufficient documentation

## 2016-05-02 MED ORDER — SODIUM IODIDE I 131 CAPSULE
18.1000 | Freq: Once | INTRAVENOUS | Status: AC | PRN
  Administered 2016-05-02: 18.1 via ORAL

## 2016-05-03 NOTE — Progress Notes (Signed)
Please remind patient to restart her methimazole on Friday, she also needs to be seen in the last week of May instead of next week

## 2016-05-09 ENCOUNTER — Other Ambulatory Visit: Payer: Medicare Other

## 2016-05-10 DIAGNOSIS — L439 Lichen planus, unspecified: Secondary | ICD-10-CM | POA: Diagnosis not present

## 2016-05-10 DIAGNOSIS — L299 Pruritus, unspecified: Secondary | ICD-10-CM | POA: Diagnosis not present

## 2016-05-11 ENCOUNTER — Other Ambulatory Visit: Payer: Self-pay

## 2016-05-11 MED ORDER — METHIMAZOLE 5 MG PO TABS: 5.0000 mg | ORAL_TABLET | Freq: Two times a day (BID) | ORAL | 1 refills | Status: DC

## 2016-05-12 ENCOUNTER — Ambulatory Visit: Payer: Medicare Other | Admitting: Endocrinology

## 2016-06-06 ENCOUNTER — Other Ambulatory Visit: Payer: Medicare Other

## 2016-06-08 ENCOUNTER — Other Ambulatory Visit (INDEPENDENT_AMBULATORY_CARE_PROVIDER_SITE_OTHER): Payer: Medicare Other

## 2016-06-08 DIAGNOSIS — E0500 Thyrotoxicosis with diffuse goiter without thyrotoxic crisis or storm: Secondary | ICD-10-CM

## 2016-06-08 LAB — TSH: TSH: 0.03 u[IU]/mL — AB (ref 0.35–4.50)

## 2016-06-08 LAB — T4, FREE: FREE T4: 1.4 ng/dL (ref 0.60–1.60)

## 2016-06-12 ENCOUNTER — Encounter: Payer: Self-pay | Admitting: Endocrinology

## 2016-06-12 ENCOUNTER — Ambulatory Visit (INDEPENDENT_AMBULATORY_CARE_PROVIDER_SITE_OTHER): Payer: Medicare Other | Admitting: Endocrinology

## 2016-06-12 VITALS — BP 142/78 | HR 68 | Ht 61.5 in | Wt 151.4 lb

## 2016-06-12 DIAGNOSIS — E0500 Thyrotoxicosis with diffuse goiter without thyrotoxic crisis or storm: Secondary | ICD-10-CM

## 2016-06-12 NOTE — Progress Notes (Signed)
Patient ID: Alexandria Petersen, female   DOB: [birth year], 76 y.o.   MRN: [id number]                                                                                                               Reason for Appointment:  Hyperthyroidism, follow up   Referring physician: Diona Browner   History of Present Illness:   Prior history: When she was first seen in 12/16 she had symptoms of weight loss,  palpitations, feeling tired, occasionally feeling warm and some weakness in her legs. Also was having decreased appetite.  She previously had suppressed TSH levels since at least 10/2013 Pretreatment labs showed increased free T4 and free T3 levels in 12/16  She was also found to have a goiter on exam initially.  Was started on Methimazole 5 mg twice a day in 12/2014  With this she was feeling better with her appetite and energy level    On her  visit in 1/17 her labs were  normal and she was told to continue the dose but she did not follow-up  Subsequently on follow-up her free T4 was significantly low with taking 5 mg twice a day of methimazole. This was stopped and in 4/17 her thyroid levels are quite normal without any medication On her follow-up in 5/17 she was started back on 5 mg methimazole  RECENT history:  Her methimazole was tapered off and stopped in 09/2015 when her free T4 was low normal However without her methimazole she had a recurrence of her hyperthyroidism in 1/18; she was having symptoms of palpitations and fatigue Free T4 was 2.42  She finally had her I-131 treatment on 05/02/16 with 18.1 mCi She was also recommended continuing her methimazole after this treatment   Thyroid levels show slightly higher free T4 and before but still normal TSH is suppressed   Wt Readings from Last 3 Encounters:  06/12/16 151 lb 6.4 oz (68.7 kg)  03/24/16 146 lb (66.2 kg)  02/18/16 143 lb (64.9 kg)     Lab Results  Component Value Date   FREET4 1.40 06/08/2016   FREET4 0.88 03/17/2016   FREET4 2.42 (H) 01/24/2016   TSH 0.03 (L) 06/08/2016   TSH 0.03 (L) 01/24/2016   TSH 0.20 (L) 10/25/2015   Lab Results  Component Value Date   T3FREE 5.0 (H) 01/24/2016   T3FREE 3.4 06/25/2015      Lab Results  Component Value Date   THYROTRECAB 18.5 (H) 07/30/2015     Past history: Several years ago the patient was given unknown thyroid medication which she took for about 4 years, initially apparently she felt less tired with this but does not know why this was stopped also and no records are available TSH level has been previously normal as far back as 2008   Allergies as of 06/12/2016      Reactions   Penicillins    REACTION: hallucinations      Medication List       Accurate as of 06/12/16  3:05  PM. Always use your most recent med list.          CENTRUM SILVER ADULT 50+ PO Take 1 tablet by mouth daily.   clobetasol cream 0.05 % Commonly known as:  TEMOVATE APPLY TO AFFECTED AREA TWICE A DAY   losartan-hydrochlorothiazide 50-12.5 MG tablet Commonly known as:  HYZAAR TAKE 1 TABLET BY MOUTH EVERY DAY   methimazole 5 MG tablet Commonly known as:  TAPAZOLE Take 1 tablet (5 mg total) by mouth 2 (two) times daily.   Omega 3 1000 MG Caps Take 1 capsule by mouth daily. [employer]   pravastatin 40 MG tablet Commonly known as:  PRAVACHOL TAKE 1 TABLET BY MOUTH DAILY           Past Medical History:  Diagnosis Date  . Hypertension     Past Surgical History:  Procedure Laterality Date  . SHOULDER SURGERY     right  . TUBAL LIGATION      Family History  Problem Relation Age of Onset  . Hypertension Mother   . Diabetes Mother   . Hypertension Father   . Diabetes Sister   . Diabetes Brother   . Hypothyroidism Daughter     Social History:  reports that she has never smoked. She has never used smokeless tobacco. She reports that she does not drink alcohol or use drugs.  Allergies:  Allergies  Allergen Reactions  . Penicillins      REACTION: hallucinations     Review of Systems  Blood pressure Is being treated with Hyzaar, followed by PCP:  BP Readings from Last 3 Encounters:  06/12/16 (!) 142/78  03/24/16 (!) 144/76  02/18/16 (!) 154/63      Examination:   BP (!) 142/78   Pulse 68   Ht 5' 1.5" (1.562 m)   Wt 151 lb 6.4 oz (68.7 kg)   SpO2 98%   BMI 28.14 kg/m   She looks well Thyroid is barely palpable on the side,  firm on the left Deep tendon reflexes at biceps are showing normal relaxation No peripheral edema    Assessment/Plan:   Hyperthyroidism,  from Graves' disease   She has responded to the I-131 treatment with reduction in size of her thyroid Although her free T4 is relatively higher in the normal range last month even with 5 mg methimazole she is clinically doing better Weight is improving  Most likely she will become euthyroid even without methimazole and possibly hypothyroid within 1 month She will stop methimazole in follow-up in 4 weeks  There are no Patient Instructions on file for this visit.     Alexandria Petersen 06/12/2016, 3:05 PM   Note: This office note was prepared with Dragon voice recognition system technology. Any transcriptional errors that result from this process are unintentional.

## 2016-07-14 ENCOUNTER — Other Ambulatory Visit (INDEPENDENT_AMBULATORY_CARE_PROVIDER_SITE_OTHER): Payer: Medicare Other

## 2016-07-14 DIAGNOSIS — E0500 Thyrotoxicosis with diffuse goiter without thyrotoxic crisis or storm: Secondary | ICD-10-CM | POA: Diagnosis not present

## 2016-07-14 LAB — T4, FREE: Free T4: 0.19 ng/dL — ABNORMAL LOW (ref 0.60–1.60)

## 2016-07-14 LAB — T3, FREE: T3 FREE: 1.8 pg/mL — AB (ref 2.3–4.2)

## 2016-07-14 LAB — TSH

## 2016-07-19 ENCOUNTER — Ambulatory Visit (INDEPENDENT_AMBULATORY_CARE_PROVIDER_SITE_OTHER): Payer: Medicare Other | Admitting: Endocrinology

## 2016-07-19 ENCOUNTER — Encounter: Payer: Self-pay | Admitting: Endocrinology

## 2016-07-19 VITALS — BP 144/84 | HR 72 | Ht 61.5 in | Wt 157.0 lb

## 2016-07-19 DIAGNOSIS — E890 Postprocedural hypothyroidism: Secondary | ICD-10-CM

## 2016-07-19 MED ORDER — LEVOTHYROXINE SODIUM 112 MCG PO TABS: 112.0000 ug | ORAL_TABLET | Freq: Every day | ORAL | 3 refills | Status: DC

## 2016-07-19 NOTE — Progress Notes (Signed)
Patient ID: Alexandria Petersen, female   DOB: [birth year], 76 y.o.   MRN: [id number]                                                                                                               Reason for Appointment:  Hyperthyroidism, follow up   Referring physician: Diona Browner   History of Present Illness:   Prior history: When she was first seen in 12/16 she had symptoms of weight loss,  palpitations, feeling tired, occasionally feeling warm and some weakness in her legs. Also was having decreased appetite.  She previously had suppressed TSH levels since at least 10/2013 Pretreatment labs showed increased free T4 and free T3 levels in 12/16  She was also found to have a goiter on exam initially.  Was started on Methimazole 5 mg twice a day in 12/2014  With this she was feeling better with her appetite and energy level    On her  visit in 1/17 her labs were  normal and she was told to continue the dose but she did not follow-up  Subsequently on follow-up her free T4 was significantly low with taking 5 mg twice a day of methimazole. This was stopped and in 4/17 her thyroid levels are quite normal without any medication On her follow-up in 5/17 she was started back on 5 mg methimazole  RECENT history:  Her methimazole was tapered off and stopped in 09/2015 when her free T4 was low normal However without her methimazole she had a recurrence of her hyperthyroidism in 1/18; she was having symptoms of palpitations and fatigue Free T4 was 2.42  She finally had her I-131 treatment on 05/02/16 with 18.1 mCi Methimazole was stopped on her last visit in June  She says that for the last week or so she has been feeling a little more tired and her weight has gone up Her voice can get hoarse sometimes No significant cold intolerance, no constipation  Her free T4 is now markedly decreased along with a high TSH     Wt Readings from Last 3 Encounters:  07/19/16 157 lb (71.2 kg)  06/12/16 151 lb  6.4 oz (68.7 kg)  03/24/16 146 lb (66.2 kg)     Lab Results  Component Value Date   FREET4 0.19 (L) 07/14/2016   FREET4 1.40 06/08/2016   FREET4 0.88 03/17/2016   TSH >49.60 (H) 07/14/2016   TSH 0.03 (L) 06/08/2016   TSH 0.03 (L) 01/24/2016   Lab Results  Component Value Date   T3FREE 1.8 (L) 07/14/2016   T3FREE 5.0 (H) 01/24/2016      Lab Results  Component Value Date   THYROTRECAB 18.5 (H) 07/30/2015     Past history: Several years ago the patient was given unknown thyroid medication which she took for about 4 years, initially apparently she felt less tired with this but does not know why this was stopped also and no records are available TSH level has been previously normal as far back as 2008   Allergies as  of 07/19/2016      Reactions   Penicillins    REACTION: hallucinations      Medication List       Accurate as of 07/19/16  1:50 PM. Always use your most recent med list.          CENTRUM SILVER ADULT 50+ PO Take 1 tablet by mouth daily.   levothyroxine 112 MCG tablet Commonly known as:  SYNTHROID Take 1 tablet (112 mcg total) by mouth daily before breakfast.   losartan-hydrochlorothiazide 50-12.5 MG tablet Commonly known as:  HYZAAR TAKE 1 TABLET BY MOUTH EVERY DAY   Omega 3 1000 MG Caps Take 1 capsule by mouth daily. [employer]   pravastatin 40 MG tablet Commonly known as:  PRAVACHOL TAKE 1 TABLET BY MOUTH DAILY           Past Medical History:  Diagnosis Date  . Hypertension     Past Surgical History:  Procedure Laterality Date  . SHOULDER SURGERY     right  . TUBAL LIGATION      Family History  Problem Relation Age of Onset  . Hypertension Mother   . Diabetes Mother   . Hypertension Father   . Diabetes Sister   . Diabetes Brother   . Hypothyroidism Daughter     Social History:  reports that she has never smoked. She has never used smokeless tobacco. She reports that she does not drink alcohol or use  drugs.  Allergies:  Allergies  Allergen Reactions  . Penicillins     REACTION: hallucinations     Review of Systems  Blood pressure Is being treated with Hyzaar, followed by PCP:  BP Readings from Last 3 Encounters:  07/19/16 (!) 144/84  06/12/16 (!) 142/78  03/24/16 (!) 144/76      Examination:   BP (!) 144/84   Pulse 86   Ht 5' 1.5" (1.562 m)   Wt 157 lb (71.2 kg)   SpO2 97%   BMI 29.18 kg/m   Face looks mildly puffy Has mild hoarseness Thyroid not palpable Deep tendon reflexes at biceps are showing slow relaxation No peripheral edema    Assessment/Plan:  Post-ablative hypothyroidism  She has responded to the I-131 treatment which was done 2-1/2 months ago She is now hypothyroid and her thyroid levels have decreased very significantly since last month  Discusses that she is now hypothyroid and needs levothyroxine supplementation lifelong  Considering her weight will have her start with 112 g levothyroxine but will need to reassess her thyroid levels in 6 weeks She should feel better within 2 weeks and she can call if she is not improving or having any new symptoms Discussed when to take her thyroid medication, need to take it daily and need for long-term supplementation  Also plan to her that once her thyroid level is stable she can be followed annually  Total visit time for assessment, evaluation, counseling and planning = 15 minutes  There are no Patient Instructions on file for this visit.     Owyn Raulston 07/19/2016, 1:50 PM   Note: This office note was prepared with Estate agent. Any transcriptional errors that result from this process are unintentional.

## 2016-08-28 ENCOUNTER — Other Ambulatory Visit (INDEPENDENT_AMBULATORY_CARE_PROVIDER_SITE_OTHER): Payer: Medicare Other

## 2016-08-28 DIAGNOSIS — E890 Postprocedural hypothyroidism: Secondary | ICD-10-CM | POA: Diagnosis not present

## 2016-08-28 LAB — TSH: TSH: 0.36 u[IU]/mL (ref 0.35–4.50)

## 2016-08-28 LAB — T4, FREE: Free T4: 1.71 ng/dL — ABNORMAL HIGH (ref 0.60–1.60)

## 2016-08-31 ENCOUNTER — Encounter: Payer: Self-pay | Admitting: Endocrinology

## 2016-08-31 ENCOUNTER — Ambulatory Visit (INDEPENDENT_AMBULATORY_CARE_PROVIDER_SITE_OTHER): Payer: Medicare Other | Admitting: Endocrinology

## 2016-08-31 VITALS — BP 148/82 | HR 83 | Ht 61.5 in | Wt 155.0 lb

## 2016-08-31 DIAGNOSIS — E890 Postprocedural hypothyroidism: Secondary | ICD-10-CM | POA: Diagnosis not present

## 2016-08-31 MED ORDER — LEVOTHYROXINE SODIUM 100 MCG PO TABS: 100.0000 ug | ORAL_TABLET | Freq: Every day | ORAL | 3 refills | Status: DC

## 2016-08-31 NOTE — Progress Notes (Signed)
Patient ID: Alexandria Petersen, female   DOB: 13-Aug-1940, 76 y.o.   MRN: [id number]                                                                                                               Reason for Appointment:  Hyperthyroidism, follow up   Referring physician: Diona Browner   History of Present Illness:   Prior history: When she was first seen in 12/16 she had symptoms of weight loss,  palpitations, feeling tired, occasionally feeling warm and some weakness in her legs. Also was having decreased appetite.  She previously had suppressed TSH levels since at least 10/2013 Pretreatment labs showed increased free T4 and free T3 levels in 12/16  She was also found to have a goiter on exam initially.  Was started on Methimazole 5 mg twice a day in 12/2014  With this she was feeling better with her appetite and energy level    On her  visit in 1/17 her labs were  normal and she was told to continue the dose but she did not follow-up  Subsequently on follow-up her free T4 was significantly low with taking 5 mg twice a day of methimazole. This was stopped and in 4/17 her thyroid levels are quite normal without any medication On her follow-up in 5/17 she was started back on 5 mg methimazole  RECENT history:  Her methimazole was tapered off and stopped in 09/2015 when her free T4 was low normal However without her methimazole she had a recurrence of her hyperthyroidism in 1/18; she was having symptoms of palpitations and fatigue Free T4 was 2.42  She finally had her I-131 treatment on 05/02/16 with 18.1 mCi  Subsequently she had become hypothyroid in 7/18 when she was having fatigue, increased weight and hoarseness More recently she is starting to feel less tired and does not have any palpitations However she is concerned that she is getting more hungry She is also still having some cold intolerance Her weight is down couple of pounds since last visit  Her free T4 is now slightly high with  low normal TSH     Wt Readings from Last 3 Encounters:  08/31/16 155 lb (70.3 kg)  07/19/16 157 lb (71.2 kg)  06/12/16 151 lb 6.4 oz (68.7 kg)     Lab Results  Component Value Date   FREET4 1.71 (H) 08/28/2016   FREET4 0.19 (L) 07/14/2016   FREET4 1.40 06/08/2016   TSH 0.36 08/28/2016   TSH >49.60 (H) 07/14/2016   TSH 0.03 (L) 06/08/2016   Lab Results  Component Value Date   T3FREE 1.8 (L) 07/14/2016   T3FREE 5.0 (H) 01/24/2016      Lab Results  Component Value Date   THYROTRECAB 18.5 (H) 07/30/2015     Past history: Several years ago the patient was given unknown thyroid medication which she took for about 4 years, initially apparently she felt less tired with this but does not know why this was stopped also and no records are available TSH  level has been previously normal as far back as 2008   Allergies as of 08/31/2016      Reactions   Penicillins    REACTION: hallucinations      Medication List       Accurate as of 08/31/16  2:52 PM. Always use your most recent med list.          CENTRUM SILVER ADULT 50+ PO Take 1 tablet by mouth daily.   levothyroxine 112 MCG tablet Commonly known as:  SYNTHROID Take 1 tablet (112 mcg total) by mouth daily before breakfast.   losartan-hydrochlorothiazide 50-12.5 MG tablet Commonly known as:  HYZAAR TAKE 1 TABLET BY MOUTH EVERY DAY   Omega 3 1000 MG Caps Take 1 capsule by mouth daily. [employer]   pravastatin 40 MG tablet Commonly known as:  PRAVACHOL TAKE 1 TABLET BY MOUTH DAILY           Past Medical History:  Diagnosis Date  . Hypertension     Past Surgical History:  Procedure Laterality Date  . SHOULDER SURGERY     right  . TUBAL LIGATION      Family History  Problem Relation Age of Onset  . Hypertension Mother   . Diabetes Mother   . Hypertension Father   . Diabetes Sister   . Diabetes Brother   . Hypothyroidism Daughter     Social History:  reports that she has never  smoked. She has never used smokeless tobacco. She reports that she does not drink alcohol or use drugs.  Allergies:  Allergies  Allergen Reactions  . Penicillins     REACTION: hallucinations     Review of Systems  Blood pressure Is being treated with Hyzaar, followed by PCP:  BP Readings from Last 3 Encounters:  08/31/16 (!) 148/82  07/19/16 (!) 144/84  06/12/16 (!) 142/78      Examination:   BP (!) 148/82   Pulse 83   Ht 5' 1.5" (1.562 m)   Wt 155 lb (70.3 kg)   SpO2 98%   BMI 28.81 kg/m   Face looks mildly puffy Has mild hoarseness Thyroid not palpable Deep tendon reflexes at biceps are showing slow relaxation No peripheral edema    Assessment/Plan:  Post-ablative hypothyroidism  She has Had significant hypothyroidism as of 7/18 Currently doing symptomatically better with taking Synthroid 112 g daily She is asymptomatic at this time and only complaining of increased appetite  Although she looks euthyroid on exam her TSH is low normal and free T4 is mildly increased  Since her thyroid levels have increased fairly significantly over about 5 weeks or so she probably will need lower dose of thyroid supplementation than 112 g  She will be switched to 100 g from the next prescription Since she wants to use up the 112 g dose she can take this 6 days a week for now Discussed that her physiological thyroid needs are being supplied by the tablet now instead of her thyroid gland which has been ablated Reminded her to take her thyroid tablet in the morning before breakfast  Her symptoms of increased appetite is likely unrelated and not significant at this time since she has not gained any weight  Total visit time  = 15 minutes  There are no Patient Instructions on file for this visit.     Deontae Robson 08/31/2016, 2:52 PM   Note: This office note was prepared with Dragon voice recognition system technology. Any transcriptional errors that result from  this  process are unintentional.

## 2016-08-31 NOTE — Patient Instructions (Signed)
Take 6 days per week

## 2016-09-07 ENCOUNTER — Other Ambulatory Visit: Payer: Self-pay | Admitting: Family Medicine

## 2016-09-16 ENCOUNTER — Telehealth: Payer: Self-pay | Admitting: Endocrinology

## 2016-09-18 NOTE — Telephone Encounter (Signed)
I have refused this medication per Dr. Dwyane Dee.

## 2016-09-18 NOTE — Telephone Encounter (Signed)
Please advise if okay to refill this medication. I do not see on the medication list and not in plan in last OV note.

## 2016-09-18 NOTE — Telephone Encounter (Signed)
She will not take this medication

## 2016-10-01 ENCOUNTER — Other Ambulatory Visit: Payer: Self-pay | Admitting: Endocrinology

## 2016-10-23 ENCOUNTER — Other Ambulatory Visit (INDEPENDENT_AMBULATORY_CARE_PROVIDER_SITE_OTHER): Payer: Medicare Other

## 2016-10-23 DIAGNOSIS — E890 Postprocedural hypothyroidism: Secondary | ICD-10-CM | POA: Diagnosis not present

## 2016-10-23 LAB — T4, FREE: Free T4: 1.45 ng/dL (ref 0.60–1.60)

## 2016-10-23 LAB — TSH: TSH: 0.26 u[IU]/mL — AB (ref 0.35–4.50)

## 2016-10-26 ENCOUNTER — Encounter: Payer: Self-pay | Admitting: Endocrinology

## 2016-10-26 ENCOUNTER — Ambulatory Visit (INDEPENDENT_AMBULATORY_CARE_PROVIDER_SITE_OTHER): Payer: Medicare Other | Admitting: Endocrinology

## 2016-10-26 VITALS — BP 138/74 | HR 73 | Ht 61.5 in | Wt 158.6 lb

## 2016-10-26 DIAGNOSIS — Z23 Encounter for immunization: Secondary | ICD-10-CM | POA: Diagnosis not present

## 2016-10-26 DIAGNOSIS — E890 Postprocedural hypothyroidism: Secondary | ICD-10-CM

## 2016-10-26 MED ORDER — LEVOTHYROXINE SODIUM 88 MCG PO TABS: 88.0000 ug | ORAL_TABLET | Freq: Every day | ORAL | 3 refills | Status: DC

## 2016-10-26 NOTE — Progress Notes (Signed)
Patient ID: Alexandria Petersen, female   DOB: 01-13-1940, 76 y.o.   MRN: [id number]                                                                                                               Reason for Appointment:  Hyperthyroidism, follow up   Referring physician: Diona Browner   History of Present Illness:   Prior history: When she was first seen in 12/16 she had symptoms of weight loss,  palpitations, feeling tired, occasionally feeling warm and some weakness in her legs. Also was having decreased appetite.  She previously had suppressed TSH levels since at least 10/2013 Pretreatment labs showed increased free T4 and free T3 levels in 12/16  She was also found to have a goiter on exam initially.  Was started on Methimazole 5 mg twice a day in 12/2014  With this she was feeling better with her appetite and energy level    On her  visit in 1/17 her labs were  normal and she was told to continue the dose but she did not follow-up  Subsequently on follow-up her free T4 was significantly low with taking 5 mg twice a day of methimazole. This was stopped and in 4/17 her thyroid levels are quite normal without any medication On her follow-up in 5/17 she was started back on 5 mg methimazole  RECENT history:  Her methimazole was tapered off and stopped in 09/2015 when her free T4 was low normal However without her methimazole she had a recurrence of her hyperthyroidism in 1/18; she was having symptoms of palpitations and fatigue Free T4 was 2.42 She finally had her I-131 treatment on 05/02/16 with 18.1 mCi  Subsequently she had become hypothyroid in 7/18 when she was having fatigue, increased weight and hoarseness She has been started on levothyroxine, initially 112 g and then this was reduced to 100 g in August  With thyroid supplementation she has had progressively improved energy level She however has gained weight although she is trying to cut back on portions She is also still having  some cold intolerance   Her free T4 is now back to normal with slightly suppressed TSH     Wt Readings from Last 3 Encounters:  10/26/16 158 lb 9.6 oz (71.9 kg)  08/31/16 155 lb (70.3 kg)  07/19/16 157 lb (71.2 kg)     Lab Results  Component Value Date   FREET4 1.45 10/23/2016   FREET4 1.71 (H) 08/28/2016   FREET4 0.19 (L) 07/14/2016   TSH 0.26 (L) 10/23/2016   TSH 0.36 08/28/2016   TSH >49.60 (H) 07/14/2016   Lab Results  Component Value Date   T3FREE 1.8 (L) 07/14/2016   T3FREE 5.0 (H) 01/24/2016      Lab Results  Component Value Date   THYROTRECAB 18.5 (H) 07/30/2015     Past history: Several years ago the patient was given unknown thyroid medication which she took for about 4 years, initially apparently she felt less tired with this but does not know why  this was stopped also and no records are available TSH level has been previously normal as far back as 2008   Allergies as of 10/26/2016      Reactions   Penicillins    REACTION: hallucinations      Medication List       Accurate as of 10/26/16  3:09 PM. Always use your most recent med list.          CENTRUM SILVER ADULT 50+ PO Take 1 tablet by mouth daily.   levothyroxine 100 MCG tablet Commonly known as:  SYNTHROID Take 1 tablet (100 mcg total) by mouth daily before breakfast.   losartan-hydrochlorothiazide 50-12.5 MG tablet Commonly known as:  HYZAAR TAKE 1 TABLET BY MOUTH EVERY DAY   Omega 3 1000 MG Caps Take 1 capsule by mouth daily. [employer]   pravastatin 40 MG tablet Commonly known as:  PRAVACHOL TAKE 1 TABLET BY MOUTH DAILY           Past Medical History:  Diagnosis Date  . Hypertension     Past Surgical History:  Procedure Laterality Date  . SHOULDER SURGERY     right  . TUBAL LIGATION      Family History  Problem Relation Age of Onset  . Hypertension Mother   . Diabetes Mother   . Hypertension Father   . Diabetes Sister   . Diabetes Brother   .  Hypothyroidism Daughter     Social History:  reports that she has never smoked. She has never used smokeless tobacco. She reports that she does not drink alcohol or use drugs.  Allergies:  Allergies  Allergen Reactions  . 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  She has Had significant hypothyroidism  Currently doing symptomatically better with taking Synthroid 100 g daily; She has normal energy level Her weight is gradually increasing but she did have a baseline weight of about 160 prior to her thyroid disease She thinks her appetite is increased, also has mild cold intolerance continuing  Even with reducing the dose to 100 g her TSH is now slightly suppressed For now will reduce her dose down to 88 g  HYPERTENSION: Blood pressure is still relatively high she will follow-up with PCP  Influenza vaccine given at patient request, and patient handout on this given  Total visit time  = 15 minutes  There are no Patient Instructions on file for this visit.     Alexandria Petersen 10/26/2016, 3:09 PM   Note: This office note was prepared with Dragon voice recognition system technology. Any transcriptional errors that result from this process are unintentional.

## 2016-12-04 ENCOUNTER — Other Ambulatory Visit: Payer: Self-pay | Admitting: Family Medicine

## 2016-12-20 ENCOUNTER — Telehealth: Payer: Self-pay | Admitting: Family Medicine

## 2016-12-20 DIAGNOSIS — E7800 Pure hypercholesterolemia, unspecified: Secondary | ICD-10-CM

## 2016-12-20 DIAGNOSIS — R7303 Prediabetes: Secondary | ICD-10-CM

## 2016-12-20 DIAGNOSIS — M8589 Other specified disorders of bone density and structure, multiple sites: Secondary | ICD-10-CM

## 2016-12-20 NOTE — Telephone Encounter (Signed)
-----   Message from [name] sent at 12/14/2016  9:28 AM EST ----- Regarding: Lab orders for Thursday, 12.13.18  AWV lab orders, please.

## 2016-12-21 ENCOUNTER — Other Ambulatory Visit: Payer: Medicare Other

## 2016-12-22 ENCOUNTER — Other Ambulatory Visit (INDEPENDENT_AMBULATORY_CARE_PROVIDER_SITE_OTHER): Payer: Medicare Other

## 2016-12-22 DIAGNOSIS — E7800 Pure hypercholesterolemia, unspecified: Secondary | ICD-10-CM | POA: Diagnosis not present

## 2016-12-22 DIAGNOSIS — M8589 Other specified disorders of bone density and structure, multiple sites: Secondary | ICD-10-CM | POA: Diagnosis not present

## 2016-12-22 DIAGNOSIS — R7303 Prediabetes: Secondary | ICD-10-CM | POA: Diagnosis not present

## 2016-12-22 DIAGNOSIS — E890 Postprocedural hypothyroidism: Secondary | ICD-10-CM

## 2016-12-22 LAB — COMPREHENSIVE METABOLIC PANEL
ALK PHOS: 49 U/L (ref 39–117)
ALT: 16 U/L (ref 0–35)
AST: 21 U/L (ref 0–37)
Albumin: 4.4 g/dL (ref 3.5–5.2)
BILIRUBIN TOTAL: 1 mg/dL (ref 0.2–1.2)
BUN: 17 mg/dL (ref 6–23)
CALCIUM: 9.7 mg/dL (ref 8.4–10.5)
CO2: 29 mEq/L (ref 19–32)
Chloride: 103 mEq/L (ref 96–112)
Creatinine, Ser: 0.98 mg/dL (ref 0.40–1.20)
GFR: 70.57 mL/min (ref >60.00)
Glucose, Bld: 104 mg/dL — ABNORMAL HIGH (ref 70–99)
Potassium: 3.7 mEq/L (ref 3.5–5.1)
Sodium: 141 mEq/L (ref 135–145)
TOTAL PROTEIN: 7.6 g/dL (ref 6.0–8.3)

## 2016-12-22 LAB — VITAMIN D 25 HYDROXY (VIT D DEFICIENCY, FRACTURES): VITD: 40.21 ng/mL (ref 30.00–100.00)

## 2016-12-22 LAB — T4, FREE: Free T4: 1.1 ng/dL (ref 0.60–1.60)

## 2016-12-22 LAB — LIPID PANEL
CHOLESTEROL: 204 mg/dL — AB (ref 0–200)
HDL: 104.3 mg/dL (ref >39.00)
LDL Cholesterol: 79 mg/dL (ref 0–99)
NonHDL: 100.07
TRIGLYCERIDES: 103 mg/dL (ref 0.0–149.0)
Total CHOL/HDL Ratio: 2
VLDL: 20.6 mg/dL (ref 0.0–40.0)

## 2016-12-22 LAB — HEMOGLOBIN A1C: Hgb A1c MFr Bld: 6 % (ref 4.6–6.5)

## 2016-12-22 LAB — TSH: TSH: 0.98 u[IU]/mL (ref 0.35–4.50)

## 2016-12-24 NOTE — Progress Notes (Signed)
Please call to let patient know that the lab results are normal and she can stay on the same doses but need to see her back in 2 months again with labs She needs another appointment to be made in 2 months

## 2016-12-25 ENCOUNTER — Ambulatory Visit (INDEPENDENT_AMBULATORY_CARE_PROVIDER_SITE_OTHER): Payer: Medicare Other

## 2016-12-25 VITALS — BP 130/66 | HR 86 | Temp 98.6°F | Ht 61.25 in | Wt 158.5 lb

## 2016-12-25 DIAGNOSIS — Z0000 Encounter for general adult medical examination without abnormal findings: Secondary | ICD-10-CM

## 2016-12-25 NOTE — Progress Notes (Signed)
Subjective:   Alexandria Petersen is a 76 y.o. female who presents for Medicare Annual (Subsequent) preventive examination.  Review of Systems:  N/A Cardiac Risk Factors include: advanced age (>107men, >41 women);dyslipidemia     Objective:     Vitals: BP 130/66 (BP Location: Left Arm, Patient Position: Sitting, Cuff Size: Normal)   Pulse 86   Temp 98.6 F (37 C)   Ht 5' 1.25" (1.556 m) Comment: no shoes  Wt 158 lb 8 oz (71.9 kg)   SpO2 98%   BMI 29.70 kg/m   Body mass index is 29.7 kg/m.  Advanced Directives 12/25/2016 12/21/2015  Does Patient Have a Medical Advance Directive? No No  Would patient like information on creating a medical advance directive? No - Patient declined -    Tobacco Social History   Tobacco Use  Smoking Status Never Smoker  Smokeless Tobacco Never Used     Counseling given: No   Clinical Intake:  Pre-visit preparation completed: Yes  Pain : 0-10 Pain Score: 3  Pain Type: Chronic pain Pain Location: Knee Pain Orientation: Left, Right Pain Onset: More than a month ago Pain Frequency: Constant     Nutritional Status: BMI 25 -29 Overweight Nutritional Risks: None Diabetes: No  How often do you need to have someone help you when you read instructions, pamphlets, or other written materials from your doctor or pharmacy?: 1 - Never What is the last grade level you completed in school?: 12th grade  Interpreter Needed?: No  Comments: pt is a widow and lives alone Information entered by :: LPinson, LPN  Past Medical History:  Diagnosis Date  . Hypertension    Past Surgical History:  Procedure Laterality Date  . SHOULDER SURGERY     right  . TUBAL LIGATION     Family History  Problem Relation Age of Onset  . Hypertension Mother   . Diabetes Mother   . Hypertension Father   . Diabetes Sister   . Diabetes Brother   . Hypothyroidism Daughter    Social History   Socioeconomic History  . Marital status: Widowed    Spouse  name: None  . Number of children: 3  . Years of education: None  . Highest education level: None  Social Needs  . Financial resource strain: None  . Food insecurity - worry: None  . Food insecurity - inability: None  . Transportation needs - medical: None  . Transportation needs - non-medical: None  Occupational History  . Occupation: retired day Counselling psychologist: RETIRED  Tobacco Use  . Smoking status: Never Smoker  . Smokeless tobacco: Never Used  Substance and Sexual Activity  . Alcohol use: No  . Drug use: No  . Sexual activity: No  Other Topics Concern  . None  Social History Narrative   Regular exercise--minimal   She is working on setting up end of life planning.   No living will. Full code (reviewed 2013)                Outpatient Encounter Medications as of 12/25/2016  Medication Sig  . levothyroxine (SYNTHROID, LEVOTHROID) 88 MCG tablet Take 1 tablet (88 mcg total) by mouth daily before breakfast.  . losartan-hydrochlorothiazide (HYZAAR) 50-12.5 MG tablet TAKE 1 TABLET BY MOUTH EVERY DAY  . Multiple Vitamins-Minerals (CENTRUM SILVER ADULT 50+ PO) Take 1 tablet by mouth daily.  . Omega 3 1000 MG CAPS Take 1 capsule by mouth daily. [employer]  .  pravastatin (PRAVACHOL) 40 MG tablet TAKE 1 TABLET BY MOUTH DAILY   No facility-administered encounter medications on file as of 12/25/2016.     Activities of Daily Living In your present state of health, do you have any difficulty performing the following activities: 12/25/2016  Hearing? N  Vision? N  Difficulty concentrating or making decisions? N  Walking or climbing stairs? N  Dressing or bathing? N  Doing errands, shopping? N  Preparing Food and eating ? N  Using the Toilet? N  In the past six months, have you accidently leaked urine? N  Do you have problems with loss of bowel control? N  Managing your Medications? N  Managing your Finances? N  Housekeeping or managing your  Housekeeping? N  Some recent data might be hidden    Patient Care Team: Jinny Sanders, MD as PCP - General    Assessment:   This is a routine wellness examination for Alexandria Petersen.   Hearing Screening   125Hz  250Hz  500Hz  1000Hz  2000Hz  3000Hz  4000Hz  6000Hz  8000Hz   Right ear:   0 0 40  0    Left ear:   0 0 0  0      Visual Acuity Screening   Right eye Left eye Both eyes  Without correction: 20/40-1 20/40 20/40  With correction:        Exercise Activities and Dietary recommendations Current Exercise Habits: The patient does not participate in regular exercise at present, Exercise limited by: None identified  Goals    . DIET - INCREASE WATER INTAKE     Starting 12/25/2016, I will attempt to drink 6-8 glasses of water daily.        Fall Risk Fall Risk  12/25/2016 12/21/2015 12/10/2014 11/07/2013 11/05/2012  Falls in the past year? No No No No No   Depression Screen PHQ 2/9 Scores 12/25/2016 12/21/2015 12/10/2014 11/07/2013  PHQ - 2 Score 0 0 0 0  PHQ- 9 Score 1 - - -     Cognitive Function MMSE - Mini Mental State Exam 12/25/2016  Orientation to time 5  Orientation to Place 5  Registration 3  Attention/ Calculation 0  Recall 1  Recall-comments unable to recall 2 of 3 words  Language- name 2 objects 0  Language- repeat 1  Language- follow 3 step command 3  Language- read & follow direction 0  Write a sentence 0  Copy design 0  Total score 18       PLEASE NOTE: A Mini-Cog screen was completed. Maximum score is 20. A value of 0 denotes this part of Folstein MMSE was not completed or the patient failed this part of the Mini-Cog screening.   Mini-Cog Screening Orientation to Time - Max 5 pts Orientation to Place - Max 5 pts Registration - Max 3 pts Recall - Max 3 pts Language Repeat - Max 1 pts Language Follow 3 Step Command - Max 3 pts   Immunization History  Administered Date(s) Administered  . Influenza Whole 10/16/2008, 10/06/2009  . Influenza, High Dose  Seasonal PF 10/28/2015, 10/26/2016  . Influenza,inj,Quad PF,6+ Mos 11/05/2012, 10/30/2013, 10/30/2014  . PPD Test 04/21/2014  . Pneumococcal Conjugate-13 12/10/2014  . Pneumococcal Polysaccharide-23 10/26/2009  . Td 08/29/2005  . Tdap 04/05/2010  . Zoster 11/09/2009    Screening Tests Health Maintenance  Topic Date Due  . MAMMOGRAM  01/24/2017  . TETANUS/TDAP  04/04/2020  . INFLUENZA VACCINE  Completed  . DEXA SCAN  Completed  . PNA vac Low Risk Adult  Completed       Plan:     I have personally reviewed, addressed, and noted the following in the patient's chart:  A. Medical and social history B. Use of alcohol, tobacco or illicit drugs  C. Current medications and supplements D. Functional ability and status E.  Nutritional status F.  Physical activity G. Advance directives H. List of other physicians I.  Hospitalizations, surgeries, and ER visits in previous 12 months J.  [place] to include hearing, vision, cognitive, depression L. Referrals and appointments - none  In addition, I have reviewed and discussed with patient certain preventive protocols, quality metrics, and best practice recommendations. A written personalized care plan for preventive services as well as general preventive health recommendations were provided to patient.  See attached scanned questionnaire for additional information.   Signed,   Lindell Noe, MHA, BS, LPN Health Coach

## 2016-12-25 NOTE — Progress Notes (Signed)
I reviewed health advisor's note, was available for consultation, and agree with documentation and plan.   Signed,  Jarreau Callanan T. Marveline Profeta, MD  

## 2016-12-25 NOTE — Patient Instructions (Signed)
Alexandria Petersen , Thank you for taking time to come for your Medicare Wellness Visit. I appreciate your ongoing commitment to your health goals. Please review the following plan we discussed and let me know if I can assist you in the future.   These are the goals we discussed: Goals    . DIET - INCREASE WATER INTAKE     Starting 12/25/2016, I will attempt to drink 6-8 glasses of water daily.        This is a list of the screening recommended for you and due dates:  Health Maintenance  Topic Date Due  . Mammogram  01/24/2017  . Tetanus Vaccine  04/04/2020  . Flu Shot  Completed  . DEXA scan (bone density measurement)  Completed  . Pneumonia vaccines  Completed   Preventive Care for Adults  A healthy lifestyle and preventive care can promote health and wellness. Preventive health guidelines for adults include the following key practices.  . A routine yearly physical is a good way to check with your health care provider about your health and preventive screening. It is a chance to share any concerns and updates on your health and to receive a thorough exam.  . Visit your dentist for a routine exam and preventive care every 6 months. Brush your teeth twice a day and floss once a day. Good oral hygiene prevents tooth decay and gum disease.  . The frequency of eye exams is based on your age, health, family medical history, use  of contact lenses, and other factors. Follow your health care provider's recommendations for frequency of eye exams.  . Eat a healthy diet. Foods like vegetables, fruits, whole grains, low-fat dairy products, and lean protein foods contain the nutrients you need without too many calories. Decrease your intake of foods high in solid fats, added sugars, and salt. Eat the right amount of calories for you. Get information about a proper diet from your health care provider, if necessary.  . Regular physical exercise is one of the most important things you can do for your  health. Most adults should get at least 150 minutes of moderate-intensity exercise (any activity that increases your heart rate and causes you to sweat) each week. In addition, most adults need muscle-strengthening exercises on 2 or more days a week.  Silver Sneakers may be a benefit available to you. To determine eligibility, you may visit the website: www.silversneakers.com or contact program at [phone] Mon-Fri between 8AM-8PM.   . Maintain a healthy weight. The body mass index (BMI) is a screening tool to identify possible weight problems. It provides an estimate of body fat based on height and weight. Your health care provider can find your BMI and can help you achieve or maintain a healthy weight.   For adults 20 years and older: ? A BMI below 18.5 is considered underweight. ? A BMI of 18.5 to 24.9 is normal. ? A BMI of 25 to 29.9 is considered overweight. ? A BMI of 30 and above is considered obese.   . Maintain normal blood lipids and cholesterol levels by exercising and minimizing your intake of saturated fat. Eat a balanced diet with plenty of fruit and vegetables. Blood tests for lipids and cholesterol should begin at age 80 and be repeated every 5 years. If your lipid or cholesterol levels are high, you are over 50, or you are at high risk for heart disease, you may need your cholesterol levels checked more frequently. Ongoing high lipid and cholesterol  levels should be treated with medicines if diet and exercise are not working.  . If you smoke, find out from your health care provider how to quit. If you do not use tobacco, please do not start.  . If you choose to drink alcohol, please do not consume more than 2 drinks per day. One drink is considered to be 12 ounces (355 mL) of beer, 5 ounces (148 mL) of wine, or 1.5 ounces (44 mL) of liquor.  . If you are 88-103 years old, ask your health care provider if you should take aspirin to prevent strokes.  . Use sunscreen. Apply  sunscreen liberally and repeatedly throughout the day. You should seek shade when your shadow is shorter than you. Protect yourself by wearing long sleeves, pants, a wide-brimmed hat, and sunglasses year round, whenever you are outdoors.  . Once a month, do a whole body skin exam, using a mirror to look at the skin on your back. Tell your health care provider of new moles, moles that have irregular borders, moles that are larger than a pencil eraser, or moles that have changed in shape or color.

## 2016-12-25 NOTE — Progress Notes (Signed)
PCP notes:   Health maintenance:  No gaps identified.   Abnormal screenings:   Mini-Cog score: 18/20 Depression score: 1 Hearing - failed  Hearing Screening   125Hz  250Hz  500Hz  1000Hz  2000Hz  3000Hz  4000Hz  6000Hz  8000Hz   Right ear:   0 0 40  0    Left ear:   0 0 0  0     Patient concerns:   None  Nurse concerns:  None  Next PCP appt:   01/31/16 @ 1430

## 2016-12-25 NOTE — Progress Notes (Signed)
Pre visit review using our clinic review tool, if applicable. No additional management support is needed unless otherwise documented below in the visit note. 

## 2016-12-26 ENCOUNTER — Telehealth: Payer: Self-pay | Admitting: Endocrinology

## 2016-12-26 ENCOUNTER — Other Ambulatory Visit: Payer: Self-pay

## 2016-12-26 MED ORDER — LEVOTHYROXINE SODIUM 88 MCG PO TABS: 88.0000 ug | ORAL_TABLET | Freq: Every day | ORAL | 1 refills | Status: DC

## 2016-12-27 ENCOUNTER — Other Ambulatory Visit: Payer: Self-pay

## 2016-12-27 MED ORDER — LEVOTHYROXINE SODIUM 88 MCG PO TABS: 88.0000 ug | ORAL_TABLET | Freq: Every day | ORAL | 2 refills | Status: DC

## 2016-12-27 NOTE — Telephone Encounter (Signed)
error 

## 2017-01-15 ENCOUNTER — Other Ambulatory Visit: Payer: Self-pay | Admitting: Family Medicine

## 2017-01-15 DIAGNOSIS — Z1231 Encounter for screening mammogram for malignant neoplasm of breast: Secondary | ICD-10-CM

## 2017-01-16 ENCOUNTER — Other Ambulatory Visit: Payer: Self-pay | Admitting: *Deleted

## 2017-01-16 MED ORDER — PRAVASTATIN SODIUM 40 MG PO TABS: 40.0000 mg | ORAL_TABLET | Freq: Every day | ORAL | 3 refills | Status: DC

## 2017-01-30 ENCOUNTER — Ambulatory Visit (INDEPENDENT_AMBULATORY_CARE_PROVIDER_SITE_OTHER): Payer: Medicare Other | Admitting: Family Medicine

## 2017-01-30 ENCOUNTER — Other Ambulatory Visit: Payer: Self-pay

## 2017-01-30 ENCOUNTER — Encounter: Payer: Self-pay | Admitting: Family Medicine

## 2017-01-30 VITALS — BP 120/64 | HR 72 | Temp 98.6°F | Ht 61.25 in | Wt 159.0 lb

## 2017-01-30 DIAGNOSIS — I10 Essential (primary) hypertension: Secondary | ICD-10-CM

## 2017-01-30 DIAGNOSIS — E7800 Pure hypercholesterolemia, unspecified: Secondary | ICD-10-CM

## 2017-01-30 DIAGNOSIS — Z0000 Encounter for general adult medical examination without abnormal findings: Secondary | ICD-10-CM | POA: Diagnosis not present

## 2017-01-30 DIAGNOSIS — E348 Other specified endocrine disorders: Secondary | ICD-10-CM | POA: Diagnosis not present

## 2017-01-30 DIAGNOSIS — Z1211 Encounter for screening for malignant neoplasm of colon: Secondary | ICD-10-CM

## 2017-01-30 DIAGNOSIS — M170 Bilateral primary osteoarthritis of knee: Secondary | ICD-10-CM

## 2017-01-30 DIAGNOSIS — E0500 Thyrotoxicosis with diffuse goiter without thyrotoxic crisis or storm: Secondary | ICD-10-CM

## 2017-01-30 DIAGNOSIS — R7303 Prediabetes: Secondary | ICD-10-CM

## 2017-01-30 NOTE — Assessment & Plan Note (Signed)
Well controlled. Continue current medication.  

## 2017-01-30 NOTE — Patient Instructions (Addendum)
Please stop at the front desk to set up referral. Stop at lab to pick up stool cards.

## 2017-01-30 NOTE — Progress Notes (Signed)
Subjective:    Patient ID: Alexandria Petersen, female    DOB: August 14, 1940, 77 y.o.   MRN: [id number]  HPI  The patient presents for  complete physical and review of chronic health problems. He/She also has the following acute concerns today:   The patient saw Candis Musa, LPN for medicare wellness. Note reviewed in detail and important notes copied below. No gaps identified.   Abnormal screenings:   Mini-Cog score: 18/20 Depression score: 1 Hearing - failed             Hearing Screening   125Hz  250Hz  500Hz  1000Hz  2000Hz  3000Hz  4000Hz  6000Hz  8000Hz   Right ear:   0 0 40  0    Left ear:   0 0 0  0       Today 01/30/17    She has not noted memory issues.   Bilateral knee arthritis: using cream for pain.  X-ray on 2017  Elevated Cholesterol:  LDL at goal on pravastatin. Lab Results  Component Value Date   CHOL 204 (H) 12/22/2016   HDL 104.30 12/22/2016   LDLCALC 79 12/22/2016   LDLDIRECT 90.7 10/29/2012   TRIG 103.0 12/22/2016   CHOLHDL 2 12/22/2016  Using medications without problems: none Muscle aches:  none Diet compliance: good Exercise: occ walking.. Still working in daycare. Other complaints:  Hypertension:  Good control on losartan hctz  Using medication without problems or lightheadedness:  none Chest pain with exertion: none Edema:none Short of breath: none Average home BPs:  Good control at home Other issues:  Prediabetes Lab Results  Component Value Date   HGBA1C 6.0 12/22/2016    Post ablation for hyperthyroidism.. Followed by Dr. Dwyane Dee endo. Hypothyroid:  Stable control on levothyroxine Lab Results  Component Value Date   TSH 0.98 12/22/2016     Social History /Family History/Past Medical History reviewed in detail and updated in EMR if needed. Blood pressure 120/64, pulse 72, temperature 98.6 F (37 C), temperature source Oral, height 5' 1.25" (1.556 m), weight 159 lb (72.1 kg).   Review of Systems  Constitutional:  Negative for fatigue and fever.  HENT: Negative for congestion.   Eyes: Negative for pain.  Respiratory: Negative for cough and shortness of breath.   Cardiovascular: Negative for chest pain, palpitations and leg swelling.  Gastrointestinal: Negative for abdominal pain.  Genitourinary: Negative for dysuria and vaginal bleeding.  Musculoskeletal: Positive for arthralgias. Negative for back pain.  Neurological: Negative for syncope, light-headedness and headaches.  Psychiatric/Behavioral: Negative for dysphoric mood.       Objective:   Physical Exam  Constitutional: Vital signs are normal. She appears well-developed and well-nourished. She is cooperative.  Non-toxic appearance. She does not appear ill. No distress.  HENT:  Head: Normocephalic.  Right Ear: Hearing, tympanic membrane, external ear and ear canal normal.  Left Ear: Hearing, tympanic membrane, external ear and ear canal normal.  Nose: Nose normal.  Eyes: Conjunctivae, EOM and lids are normal. Pupils are equal, round, and reactive to light. Lids are everted and swept, no foreign bodies found.  Neck: Trachea normal and normal range of motion. Neck supple. Carotid bruit is not present. No thyroid mass and no thyromegaly present.  Cardiovascular: Normal rate, regular rhythm, S1 normal, S2 normal, normal heart sounds and intact distal pulses. Exam reveals no gallop.  No murmur heard. Pulmonary/Chest: Effort normal and breath sounds normal. No respiratory distress. She has no wheezes. She has no rhonchi. She has no rales.  Abdominal: Soft. Normal appearance and  bowel sounds are normal. She exhibits no distension, no fluid wave, no abdominal bruit and no mass. There is no hepatosplenomegaly. There is no tenderness. There is no rebound, no guarding and no CVA tenderness. No hernia.  Musculoskeletal:       Right knee: She exhibits decreased range of motion and deformity. Tenderness found.       Left knee: She exhibits decreased range of  motion and deformity. Tenderness found.  Lymphadenopathy:    She has no cervical adenopathy.    She has no axillary adenopathy.  Neurological: She is alert. She has normal strength. No cranial nerve deficit or sensory deficit.  Skin: Skin is warm, dry and intact. No rash noted.  Psychiatric: Her speech is normal and behavior is normal. Judgment normal. Her mood appears not anxious. Cognition and memory are normal. She does not exhibit a depressed mood.          Assessment & Plan:  The patient's preventative maintenance and recommended screening tests for an annual wellness exam were reviewed in full today. Brought up to date unless services declined.  Counselled on the importance of diet, exercise, and its role in overall health and mortality. The patient's FH and SH was reviewed, including their home life, tobacco status, and drug and alcohol status.   Vaccines: uptodate Pap/DVE:  No pap indicated Mammo:  scheduled Bone Density: due 2018.. Due now Colon: yearly stool cards.. Given today Smoking Status: nonsmoker

## 2017-01-30 NOTE — Assessment & Plan Note (Signed)
Good control

## 2017-01-30 NOTE — Assessment & Plan Note (Signed)
Well controlled. Continue current medication. Encouraged exercise, weight loss, healthy eating habits.  

## 2017-02-01 ENCOUNTER — Other Ambulatory Visit: Payer: Self-pay | Admitting: Endocrinology

## 2017-02-01 ENCOUNTER — Other Ambulatory Visit (INDEPENDENT_AMBULATORY_CARE_PROVIDER_SITE_OTHER): Payer: Medicare Other

## 2017-02-01 DIAGNOSIS — E890 Postprocedural hypothyroidism: Secondary | ICD-10-CM

## 2017-02-01 LAB — TSH: TSH: 0.74 u[IU]/mL (ref 0.35–4.50)

## 2017-02-01 LAB — T4, FREE: Free T4: 1.28 ng/dL (ref 0.60–1.60)

## 2017-02-02 ENCOUNTER — Ambulatory Visit: Payer: Medicare Other

## 2017-02-06 ENCOUNTER — Encounter: Payer: Self-pay | Admitting: Endocrinology

## 2017-02-06 ENCOUNTER — Ambulatory Visit: Payer: Medicare Other | Admitting: Endocrinology

## 2017-02-06 VITALS — BP 141/67 | HR 77 | Temp 98.7°F | Ht 61.25 in | Wt 159.0 lb

## 2017-02-06 DIAGNOSIS — E890 Postprocedural hypothyroidism: Secondary | ICD-10-CM

## 2017-02-06 NOTE — Progress Notes (Signed)
Patient ID: Alexandria Petersen, female   DOB: February 09, 1940, 77 y.o.   MRN: [id number]                                                                                                               Reason for Appointment:  Hyperthyroidism, follow up   Referring physician: Diona Browner   History of Present Illness:   Prior history: When she was first seen in 12/16 she had symptoms of weight loss,  palpitations, feeling tired, occasionally feeling warm and some weakness in her legs. Also was having decreased appetite.  She previously had suppressed TSH levels since at least 10/2013 Pretreatment labs showed increased free T4 and free T3 levels in 12/16  She was also found to have a goiter on exam initially.  Was started on Methimazole 5 mg twice a day in 12/2014  With this she was feeling better with her appetite and energy level    On her  visit in 1/17 her labs were  normal and she was told to continue the dose but she did not follow-up  Subsequently on follow-up her free T4 was significantly low with taking 5 mg twice a day of methimazole. This was stopped and in 4/17 her thyroid levels are quite normal without any medication On her follow-up in 5/17 she was started back on 5 mg methimazole  Her methimazole was tapered off and stopped in 09/2015 when her free T4 was low normal However without her methimazole she had a recurrence of her hyperthyroidism in 1/18; she was having symptoms of palpitations and fatigue She finally had her I-131 treatment on 05/02/16 with 18.1 mCi   RECENT history:  Subsequently she had become hypothyroid in 07/2016 when she was having fatigue, increased weight and hoarseness She has been started on levothyroxine, initially 112 g and then this was reduced to 100 g in August This was further reduced in 10/18 when her TSH was again slightly low  However she has not come back for follow up until now  He does not feel as tired as before and feels fairly good  overall She is also still having some cold intolerance  Weight gain has leveled off  She has been consistently taking her levothyroxine before breakfast  Her TSH is now consistently in the normal range     Wt Readings from Last 3 Encounters:  02/06/17 159 lb (72.1 kg)  01/30/17 159 lb (72.1 kg)  12/25/16 158 lb 8 oz (71.9 kg)     Lab Results  Component Value Date   FREET4 1.28 02/01/2017   FREET4 1.10 12/22/2016   FREET4 1.45 10/23/2016   TSH 0.74 02/01/2017   TSH 0.98 12/22/2016   TSH 0.26 (L) 10/23/2016   Lab Results  Component Value Date   T3FREE 1.8 (L) 07/14/2016   T3FREE 5.0 (H) 01/24/2016      Lab Results  Component Value Date   THYROTRECAB 18.5 (H) 07/30/2015     Past history: Several years ago the patient was given unknown thyroid medication  which she took for about 4 years, initially apparently she felt less tired with this but does not know why this was stopped also and no records are available TSH level has been previously normal as far back as 2008   Allergies as of 02/06/2017      Reactions   Penicillins    REACTION: hallucinations      Medication List        Accurate as of 02/06/17  3:14 PM. Always use your most recent med list.          CENTRUM SILVER ADULT 50+ PO Take 1 tablet by mouth daily.   levothyroxine 88 MCG tablet Commonly known as:  SYNTHROID, LEVOTHROID Take 1 tablet (88 mcg total) by mouth daily before breakfast.   losartan-hydrochlorothiazide 50-12.5 MG tablet Commonly known as:  HYZAAR TAKE 1 TABLET BY MOUTH EVERY DAY   Omega 3 1000 MG Caps Take 1 capsule by mouth daily. [employer]   pravastatin 40 MG tablet Commonly known as:  PRAVACHOL Take 1 tablet (40 mg total) by mouth daily.           Past Medical History:  Diagnosis Date  . Hypertension     Past Surgical History:  Procedure Laterality Date  . SHOULDER SURGERY     right  . TUBAL LIGATION      Family History  Problem Relation Age of  Onset  . Hypertension Mother   . Diabetes Mother   . Hypertension Father   . Diabetes Sister   . Diabetes Brother   . Hypothyroidism Daughter     Social History:  reports that  has never smoked. she has never used smokeless tobacco. She reports that she does not drink alcohol or use drugs.  Allergies:  Allergies  Allergen Reactions  . Penicillins     REACTION: hallucinations     Review of Systems  Blood pressure Is being treated with Hyzaar, followed by PCP:  BP Readings from Last 3 Encounters:  02/06/17 (!) 141/67  01/30/17 120/64  12/25/16 130/66      Examination:   BP (!) 141/67 (BP Location: Left Arm, Patient Position: Sitting, Cuff Size: Normal)   Pulse 77   Temp 98.7 F (37.1 C) (Oral)   Ht 5' 1.25" (1.556 m)   Wt 159 lb (72.1 kg)   SpO2 97%   BMI 29.80 kg/m   She looks well thyroid exam normal  Deep tendon reflexes at biceps are showing slightly slow relaxation   Assessment/Plan:  Post-ablative hypothyroidism since 7/18  Her dosage has been somewhat variable of the levothyroxine  Currently doing Subjectively well with taking 88 g of levothyroxine previously had been given larger doses  TSH is normal both in December and recently  She will continue the same dosage for now and follow-up in 6 months  There are no Patient Instructions on file for this visit.     Elayne Snare 02/06/2017, 3:14 PM   Note: This office note was prepared with Dragon voice recognition system technology. Any transcriptional errors that result from this process are unintentional.

## 2017-02-08 ENCOUNTER — Other Ambulatory Visit (INDEPENDENT_AMBULATORY_CARE_PROVIDER_SITE_OTHER): Payer: Medicare Other

## 2017-02-08 DIAGNOSIS — Z1211 Encounter for screening for malignant neoplasm of colon: Secondary | ICD-10-CM | POA: Diagnosis not present

## 2017-02-09 ENCOUNTER — Encounter: Payer: Self-pay | Admitting: *Deleted

## 2017-02-09 LAB — FECAL OCCULT BLOOD, IMMUNOCHEMICAL: Fecal Occult Bld: NEGATIVE

## 2017-02-20 ENCOUNTER — Other Ambulatory Visit: Payer: Self-pay | Admitting: Family Medicine

## 2017-02-20 DIAGNOSIS — E2839 Other primary ovarian failure: Secondary | ICD-10-CM

## 2017-02-23 ENCOUNTER — Ambulatory Visit
Admission: RE | Admit: 2017-02-23 | Discharge: 2017-02-23 | Disposition: A | Discharge status: Home / Self Care | Payer: Medicare Other | Source: Ambulatory Visit | Attending: Family Medicine | Admitting: Family Medicine

## 2017-02-23 ENCOUNTER — Other Ambulatory Visit: Payer: Medicare Other

## 2017-02-23 DIAGNOSIS — Z1231 Encounter for screening mammogram for malignant neoplasm of breast: Secondary | ICD-10-CM | POA: Diagnosis not present

## 2017-02-23 DIAGNOSIS — Z13820 Encounter for screening for osteoporosis: Secondary | ICD-10-CM | POA: Diagnosis not present

## 2017-02-23 DIAGNOSIS — Z780 Asymptomatic menopausal state: Secondary | ICD-10-CM | POA: Diagnosis not present

## 2017-02-23 DIAGNOSIS — E2839 Other primary ovarian failure: Secondary | ICD-10-CM

## 2017-02-26 ENCOUNTER — Other Ambulatory Visit: Payer: Self-pay | Admitting: *Deleted

## 2017-02-26 MED ORDER — LOSARTAN POTASSIUM-HCTZ 50-12.5 MG PO TABS: 1.0000 | ORAL_TABLET | Freq: Every day | ORAL | 1 refills | Status: DC

## 2017-03-08 DIAGNOSIS — H25813 Combined forms of age-related cataract, bilateral: Secondary | ICD-10-CM | POA: Diagnosis not present

## 2017-07-31 ENCOUNTER — Telehealth: Payer: Self-pay

## 2017-07-31 NOTE — Telephone Encounter (Signed)
Placed in Dr. Rometta Emery incoming basket.

## 2017-07-31 NOTE — Telephone Encounter (Signed)
Patient came by the office and dropped of a form to be filled out for her job. Last OV was in January 2019 for physical. Please let patient know when this is ready for pick up or if there are any questions. CB: [phone]. Thank you-Ahmya Bernick Estell Harpin, RMA Placed form in the RX tower for review.

## 2017-08-03 ENCOUNTER — Other Ambulatory Visit (INDEPENDENT_AMBULATORY_CARE_PROVIDER_SITE_OTHER): Payer: Medicare Other

## 2017-08-03 DIAGNOSIS — E890 Postprocedural hypothyroidism: Secondary | ICD-10-CM | POA: Diagnosis not present

## 2017-08-03 LAB — TSH: TSH: 0.35 u[IU]/mL (ref 0.35–4.50)

## 2017-08-03 LAB — T4, FREE: FREE T4: 1.31 ng/dL (ref 0.60–1.60)

## 2017-08-06 ENCOUNTER — Encounter: Payer: Self-pay | Admitting: Endocrinology

## 2017-08-06 ENCOUNTER — Ambulatory Visit: Payer: Medicare Other | Admitting: Endocrinology

## 2017-08-06 VITALS — BP 146/72 | HR 78 | Ht 61.25 in | Wt 155.2 lb

## 2017-08-06 DIAGNOSIS — E890 Postprocedural hypothyroidism: Secondary | ICD-10-CM | POA: Diagnosis not present

## 2017-08-06 MED ORDER — LEVOTHYROXINE SODIUM 75 MCG PO TABS: 75.0000 ug | ORAL_TABLET | Freq: Every day | ORAL | 1 refills | Status: DC

## 2017-08-06 NOTE — Progress Notes (Signed)
Patient ID: Alexandria Petersen, female   DOB: 1940-07-22, 77 y.o.   MRN: [id number]                                                                                                               Reason for Appointment: Hypothyroidism, follow up   Referring physician: Diona Browner   History of Present Illness:   Prior history: When she was first seen in 12/16 she had symptoms of weight loss,  palpitations, feeling tired, occasionally feeling warm and some weakness in her legs. Also was having decreased appetite.  She previously had suppressed TSH levels since at least 10/2013 Pretreatment labs showed increased free T4 and free T3 levels in 12/16  She was also found to have a goiter on exam initially.  Was started on Methimazole 5 mg twice a day in 12/2014  With this she was feeling better with her appetite and energy level    On her  visit in 1/17 her labs were  normal and she was told to continue the dose but she did not follow-up  Subsequently on follow-up her free T4 was significantly low with taking 5 mg twice a day of methimazole. This was stopped and in 4/17 her thyroid levels are quite normal without any medication On her follow-up in 5/17 she was started back on 5 mg methimazole  Her methimazole was tapered off and stopped in 09/2015 when her free T4 was low normal However without her methimazole she had a recurrence of her hyperthyroidism in 1/18; she was having symptoms of palpitations and fatigue She finally had her I-131 treatment on 05/02/16 with 18.1 mCi   RECENT history:  She had become hypothyroid in 07/2016 when she was having fatigue, increased weight and hoarseness She has been started on levothyroxine, initially 112 g and then this was reduced gradually She has been on 88 mcg since 10/18 when her TSH was again slightly low  Not complaining of any fatigue recently  She is also still having some cold intolerance  Weight gain has come down slightly No complaints of  shakiness or palpitation  She has been consistently taking her levothyroxine before breakfast  Her TSH is now low normal at 0.35      Wt Readings from Last 3 Encounters:  08/06/17 155 lb 3.2 oz (70.4 kg)  02/06/17 159 lb (72.1 kg)  01/30/17 159 lb (72.1 kg)     Lab Results  Component Value Date   FREET4 1.31 08/03/2017   FREET4 1.28 02/01/2017   FREET4 1.10 12/22/2016   TSH 0.35 08/03/2017   TSH 0.74 02/01/2017   TSH 0.98 12/22/2016   Lab Results  Component Value Date   T3FREE 1.8 (L) 07/14/2016   T3FREE 5.0 (H) 01/24/2016      Lab Results  Component Value Date   THYROTRECAB 18.5 (H) 07/30/2015     Past history: Several years ago the patient was given unknown thyroid medication which she took for about 4 years, initially apparently she felt less tired with this but  does not know why this was stopped also and no records are available TSH level has been previously normal as far back as 2008   Allergies as of 08/06/2017      Reactions   Penicillins    REACTION: hallucinations      Medication List        Accurate as of 08/06/17  3:09 PM. Always use your most recent med list.          CENTRUM SILVER ADULT 50+ PO Take 1 tablet by mouth daily.   hydrochlorothiazide 12.5 MG capsule Commonly known as:  MICROZIDE Take 12.5 mg by mouth daily.   levothyroxine 88 MCG tablet Commonly known as:  SYNTHROID, LEVOTHROID Take 1 tablet (88 mcg total) by mouth daily before breakfast.   losartan 50 MG tablet Commonly known as:  COZAAR Take 50 mg by mouth daily.   Omega 3 1000 MG Caps Take 1 capsule by mouth daily. [employer]   pravastatin 40 MG tablet Commonly known as:  PRAVACHOL Take 1 tablet (40 mg total) by mouth daily.           Past Medical History:  Diagnosis Date  . Hypertension     Past Surgical History:  Procedure Laterality Date  . SHOULDER SURGERY     right  . TUBAL LIGATION      Family History  Problem Relation Age of  Onset  . Hypertension Mother   . Diabetes Mother   . Hypertension Father   . Diabetes Sister   . Diabetes Brother   . Hypothyroidism Daughter     Social History:  reports that she has never smoked. She has never used smokeless tobacco. She reports that she does not drink alcohol or use drugs.  Allergies:  Allergies  Allergen Reactions  . Penicillins     REACTION: hallucinations     Review of Systems  Blood pressure is being treated with Hyzaar, followed by PCP:  BP Readings from Last 3 Encounters:  08/06/17 (!) 146/72  02/06/17 (!) 141/67  01/30/17 120/64      Examination:   BP (!) 146/72 (BP Location: Left Arm, Patient Position: Sitting, Cuff Size: Normal)   Pulse 78   Ht 5' 1.25" (1.556 m)   Wt 155 lb 3.2 oz (70.4 kg)   SpO2 98%   BMI 29.09 kg/m   She looks well Thyroid not palpable Skin appears normal Biceps reflexes on the right show normal relaxation    Assessment/Plan:  Post-ablative hypothyroidism since 7/18  She is doing well with no recent complaints of fatigue or unusual cold intolerance She is quite compliant with her levothyroxine 88 mcg daily  Although her TSH has been consistently normal it is trending lower and now 0.38 She is not showing any subjective or objective signs of hyperthyroidism  Considering her age will need to reduce her dosage down to 75 mcg In the meantime while she has 88 mcg dose she will take 6-1/2 tablets a week  Needs to follow-up in 6 months  There are no Patient Instructions on file for this visit.     Elayne Snare 08/06/2017, 3:09 PM   Note: This office note was prepared with Dragon voice recognition system technology. Any transcriptional errors that result from this process are unintentional.

## 2017-08-06 NOTE — Patient Instructions (Signed)
1/2 on Sundays 

## 2017-09-13 ENCOUNTER — Other Ambulatory Visit: Payer: Self-pay | Admitting: Family Medicine

## 2017-10-01 ENCOUNTER — Other Ambulatory Visit: Payer: Self-pay | Admitting: Endocrinology

## 2017-10-01 MED ORDER — LOSARTAN POTASSIUM 50 MG PO TABS: ORAL_TABLET | ORAL | 3 refills | Status: DC

## 2017-10-01 NOTE — Telephone Encounter (Signed)
Is this okay to refill? 

## 2017-10-01 NOTE — Telephone Encounter (Signed)
Prescription appears to have been sent today.  However this needs to be prescribed by PCP in the future since I am only seeing her for hypothyroidism

## 2017-10-01 NOTE — Telephone Encounter (Signed)
Pt called about pt needing refill for losartan (COZAAR) 50 MG tablet pt took last one today. Please advise thank you!  Pharmacy is [id number] - [place], [place] - [address]  Call pt @ [phone]. Thank you!

## 2017-10-02 NOTE — Telephone Encounter (Signed)
Agree prescription will be filled by me.  Given Dr. Dwyane Dee already filled until January, I will send 1 year refill when she is seen for her wellness in January.

## 2017-10-25 ENCOUNTER — Ambulatory Visit (INDEPENDENT_AMBULATORY_CARE_PROVIDER_SITE_OTHER): Payer: Medicare Other

## 2017-10-25 DIAGNOSIS — Z23 Encounter for immunization: Secondary | ICD-10-CM

## 2017-12-05 ENCOUNTER — Other Ambulatory Visit (INDEPENDENT_AMBULATORY_CARE_PROVIDER_SITE_OTHER): Payer: Medicare Other

## 2017-12-05 ENCOUNTER — Other Ambulatory Visit: Payer: Medicare Other

## 2017-12-05 DIAGNOSIS — E890 Postprocedural hypothyroidism: Secondary | ICD-10-CM

## 2017-12-05 LAB — TSH: TSH: 2.1 u[IU]/mL (ref 0.35–4.50)

## 2017-12-05 LAB — T4, FREE: Free T4: 1.14 ng/dL (ref 0.60–1.60)

## 2017-12-07 ENCOUNTER — Ambulatory Visit: Payer: Medicare Other | Admitting: Endocrinology

## 2017-12-10 ENCOUNTER — Ambulatory Visit: Payer: Medicare Other | Admitting: Endocrinology

## 2017-12-10 ENCOUNTER — Encounter: Payer: Self-pay | Admitting: Endocrinology

## 2017-12-10 VITALS — BP 150/80 | HR 87 | Ht 61.25 in | Wt 157.4 lb

## 2017-12-10 DIAGNOSIS — E890 Postprocedural hypothyroidism: Secondary | ICD-10-CM

## 2017-12-10 NOTE — Progress Notes (Signed)
Patient ID: Alexandria Petersen, female   DOB: [birth year], 77 y.o.   MRN: [id number]                                                                                                               Reason for Appointment: Hypothyroidism, follow up     History of Present Illness:   Prior history: When she was first seen in 12/16 she had symptoms of weight loss,  palpitations, feeling tired, occasionally feeling warm and some weakness in her legs. Also was having decreased appetite.  She previously had suppressed TSH levels since at least 10/2013 Pretreatment labs showed increased free T4 and free T3 levels in 12/16  She was also found to have a goiter on exam initially.  Was started on Methimazole 5 mg twice a day in 12/2014   Her methimazole was tapered off and stopped in 09/2015 when her free T4 was low normal However without her methimazole she had a recurrence of her hyperthyroidism in 1/18; she was having symptoms of palpitations and fatigue She finally had her I-131 treatment on 05/02/16 with 18.1 mCi   RECENT history:  She had become hypothyroid in 07/2016 when she was having fatigue, increased weight and hoarseness She has been started on levothyroxine, initially 112 g and then this was reduced gradually  She has been on 75 Mcg since 07/2017 when her TSH was low normal at 0.35 Although at that time she was told to take 6-1/2 tablets a week of the 88 mcg before switching to 75 daily she did not understand and is taking the 75 mcg 6-1/2 tablets a week She takes 1/2 tablet on Sundays  Currently she feels fairly good although she gets a little tired after her work which is not new  She is also still having cold intolerance  Her weight has been about the same  She has been regular with taking her levothyroxine before breakfast  Her TSH is now normal at 2.1 compared to 0.35      Wt Readings from Last 3 Encounters:  12/10/17 157 lb 6.4 oz (71.4 kg)  08/06/17 155 lb 3.2 oz (70.4  kg)  02/06/17 159 lb (72.1 kg)     Lab Results  Component Value Date   FREET4 1.14 12/05/2017   FREET4 1.31 08/03/2017   FREET4 1.28 02/01/2017   TSH 2.10 12/05/2017   TSH 0.35 08/03/2017   TSH 0.74 02/01/2017   Lab Results  Component Value Date   T3FREE 1.8 (L) 07/14/2016   T3FREE 5.0 (H) 01/24/2016      Lab Results  Component Value Date   THYROTRECAB 18.5 (H) 07/30/2015     Past history: Several years ago the patient was given unknown thyroid medication which she took for about 4 years, initially apparently she felt less tired with this but does not know why this was stopped also and no records are available TSH level has been previously normal as far back as 2008   Allergies as of 12/10/2017  Reactions   Penicillins    REACTION: hallucinations      Medication List        Accurate as of 12/10/17  1:44 PM. Always use your most recent med list.          CENTRUM SILVER ADULT 50+ PO Take 1 tablet by mouth daily.   hydrochlorothiazide 12.5 MG capsule Commonly known as:  MICROZIDE TAKE 1 CAPSULE BY MOUTH EVERY DAY   levothyroxine 75 MCG tablet Commonly known as:  SYNTHROID, LEVOTHROID Take 1 tablet (75 mcg total) by mouth daily.   losartan 50 MG tablet Commonly known as:  COZAAR Take one tablet daily   pravastatin 40 MG tablet Commonly known as:  PRAVACHOL Take 1 tablet (40 mg total) by mouth daily.           Past Medical History:  Diagnosis Date  . Hypertension     Past Surgical History:  Procedure Laterality Date  . SHOULDER SURGERY     right  . TUBAL LIGATION      Family History  Problem Relation Age of Onset  . Hypertension Mother   . Diabetes Mother   . Hypertension Father   . Diabetes Sister   . Diabetes Brother   . Hypothyroidism Daughter     Social History:  reports that she has never smoked. She has never used smokeless tobacco. She reports that she does not drink alcohol or use drugs.  Allergies:  Allergies  Allergen  Reactions  . Penicillins     REACTION: hallucinations     Review of Systems  Blood pressure is being treated with Hyzaar, followed by PCP:  BP Readings from Last 3 Encounters:  12/10/17 (!) 150/80  08/06/17 (!) 146/72  02/06/17 (!) 141/67      Examination:   BP (!) 150/80 (BP Location: Left Arm, Patient Position: Sitting, Cuff Size: Normal)   Pulse 87   Ht 5' 1.25" (1.556 m)   Wt 157 lb 6.4 oz (71.4 kg)   SpO2 98%   BMI 29.50 kg/m   She looks well    Assessment/Plan:  Post-ablative hypothyroidism since 7/18  She is taking relatively small dose of levothyroxine for her weight Currently taking 6-1/2 tablets a week of 75 mg levothyroxine She follows her instructions well Currently subjectively doing very well also  TSH is quite normal at 2.1 and improved after reducing her dose on the last visit  Needs to follow-up in 6 months  There are no Patient Instructions on file for this visit.     Alexandria Petersen 12/10/2017, 1:44 PM   Note: This office note was prepared with Dragon voice recognition system technology. Any transcriptional errors that result from this process are unintentional.

## 2017-12-18 ENCOUNTER — Encounter: Payer: Self-pay | Admitting: Family Medicine

## 2017-12-18 ENCOUNTER — Ambulatory Visit (INDEPENDENT_AMBULATORY_CARE_PROVIDER_SITE_OTHER): Payer: Medicare Other | Admitting: Family Medicine

## 2017-12-18 VITALS — BP 158/62 | HR 90 | Temp 98.2°F | Ht 61.25 in | Wt 155.5 lb

## 2017-12-18 DIAGNOSIS — M170 Bilateral primary osteoarthritis of knee: Secondary | ICD-10-CM | POA: Diagnosis not present

## 2017-12-18 DIAGNOSIS — M25562 Pain in left knee: Secondary | ICD-10-CM | POA: Diagnosis not present

## 2017-12-18 MED ORDER — DICLOFENAC SODIUM 1 % TD GEL: 4.0000 g | Freq: Four times a day (QID) | TRANSDERMAL | 11 refills | Status: DC

## 2017-12-18 NOTE — Progress Notes (Signed)
Subjective:    Patient ID: Alexandria Petersen, female    DOB: 1940/02/15, 77 y.o.   MRN: [id number]  HPI   77 year old female presents for worsening left knee pain x 1 month.  Has swelling and deformity.  No redness.   Pain worse with standing and walking or after sitting a while.  better after sitting a while.  occ feeling like it can given out.. No click or popping.  5/10   No recent falls, or injury. Using linament, OTC cream.   Occ using tylenol arthritis for pain... Helped some for pain.   She has a history of osteoarthritis in bilateral knees.   12/2015 X-ray left knee: Moderate advanced degenerative changes in the left knee involving all 3 compartments, most pronounced in the medial and patellofemoral compartments.   Has been treated in past with voltaren gel.  Blood pressure (!) 158/62, pulse 90, temperature 98.2 F (36.8 C), temperature source Oral, height 5' 1.25" (1.556 m), weight 155 lb 8 oz (70.5 kg). Social History /Family History/Past Medical History reviewed in detail and updated in EMR if needed.  Review of Systems  Constitutional: Negative for fatigue and fever.  HENT: Negative for congestion.   Eyes: Negative for pain.  Respiratory: Negative for cough and shortness of breath.   Cardiovascular: Negative for chest pain, palpitations and leg swelling.  Gastrointestinal: Negative for abdominal pain.  Genitourinary: Negative for dysuria and vaginal bleeding.  Musculoskeletal: Positive for gait problem and joint swelling. Negative for back pain.  Neurological: Negative for syncope, light-headedness and headaches.  Psychiatric/Behavioral: Negative for dysphoric mood.       Objective:   Physical Exam  Constitutional: Vital signs are normal. She appears well-developed and well-nourished. She is cooperative.  Non-toxic appearance. She does not appear ill. No distress.  HENT:  Head: Normocephalic.  Right Ear: Hearing, tympanic membrane, external ear and ear  canal normal. Tympanic membrane is not erythematous, not retracted and not bulging.  Left Ear: Hearing, tympanic membrane, external ear and ear canal normal. Tympanic membrane is not erythematous, not retracted and not bulging.  Nose: No mucosal edema or rhinorrhea. Right sinus exhibits no maxillary sinus tenderness and no frontal sinus tenderness. Left sinus exhibits no maxillary sinus tenderness and no frontal sinus tenderness.  Mouth/Throat: Uvula is midline, oropharynx is clear and moist and mucous membranes are normal.  Eyes: Pupils are equal, round, and reactive to light. Conjunctivae, EOM and lids are normal. Lids are everted and swept, no foreign bodies found.  Neck: Trachea normal and normal range of motion. Neck supple. Carotid bruit is not present. No thyroid mass and no thyromegaly present.  Cardiovascular: Normal rate, regular rhythm, S1 normal, S2 normal, normal heart sounds, intact distal pulses and normal pulses. Exam reveals no gallop and no friction rub.  No murmur heard. Pulmonary/Chest: Effort normal and breath sounds normal. No tachypnea. No respiratory distress. She has no decreased breath sounds. She has no wheezes. She has no rhonchi. She has no rales.  Abdominal: Soft. Normal appearance and bowel sounds are normal. There is no tenderness.  Musculoskeletal:       Right knee: She exhibits decreased range of motion. No tenderness found.       Left knee: She exhibits decreased range of motion, swelling, effusion and deformity. She exhibits no ecchymosis, no bony tenderness and normal meniscus. Tenderness found. Medial joint line tenderness noted. No MCL, no LCL and no patellar tendon tenderness noted.  Neurological: She is alert.  Skin:  Skin is warm, dry and intact. No rash noted.  Psychiatric: Her speech is normal and behavior is normal. Judgment and thought content normal. Her mood appears not anxious. Cognition and memory are normal. She does not exhibit a depressed mood.           Assessment & Plan:

## 2017-12-18 NOTE — Patient Instructions (Addendum)
Can use topical diclofenac gel for pain and inflammation.  For break-thorugh pain  You can use  tylenol arthritis 650 mg.. 2 capsules twice daily as needed for pain.  Call if pain is not improving for consideration of steroid injection referral.

## 2017-12-19 DIAGNOSIS — M25562 Pain in left knee: Secondary | ICD-10-CM | POA: Insufficient documentation

## 2017-12-19 NOTE — Assessment & Plan Note (Signed)
Likely due to OA. Less likely meniscal tear.. No known injury. Treat with voltaren gel and tylenol. If not improving consider referral for steroid injection.

## 2017-12-25 ENCOUNTER — Other Ambulatory Visit: Payer: Self-pay | Admitting: Family Medicine

## 2017-12-25 ENCOUNTER — Other Ambulatory Visit: Payer: Self-pay

## 2017-12-25 MED ORDER — LOSARTAN POTASSIUM 25 MG PO TABS: 25.0000 mg | ORAL_TABLET | Freq: Every day | ORAL | 3 refills | Status: DC

## 2017-12-26 ENCOUNTER — Encounter: Payer: Self-pay | Admitting: Family Medicine

## 2017-12-26 ENCOUNTER — Telehealth: Payer: Self-pay | Admitting: Family Medicine

## 2017-12-26 NOTE — Telephone Encounter (Signed)
Tried calling Alexandria Petersen no answer.  If Alexandria Petersen calls back please let her know her appointment with dr Sigmund Hazel on 01/16/18 time has changed to 3:40 Also mailed letter

## 2018-01-14 ENCOUNTER — Other Ambulatory Visit: Payer: Self-pay | Admitting: Family Medicine

## 2018-01-14 DIAGNOSIS — Z1231 Encounter for screening mammogram for malignant neoplasm of breast: Secondary | ICD-10-CM

## 2018-01-15 NOTE — Progress Notes (Signed)
Dr. Frederico Hamman T. Tyreck Bell, MD, [facility] [place], [address] Phone: [phone] Fax: [phone]  01/16/2018  Patient: Alexandria Petersen, MRN: [id number], DOB: 1940-05-29, 78 y.o.  Primary Physician:  Jinny Sanders, MD   Chief Complaint  Patient presents with  . Knee Pain    Left   Subjective:   Alexandria Petersen is a 78 y.o. very pleasant female patient who presents with the following:  Very pleasant 78 year old lady who presents for bilateral knee pain.  Most recent radiographs are from 2017, and at that point she had some fairly advanced tricompartmental osteoarthritis.  She saw Dr. Diona Browner several weeks ago, and she was given some topical Voltaren gel.  She is here today in follow-up with me to discuss some other options and to reassess her knees.  She is a very pleasant lady, and she is primarily here for left-sided knee pain.  She saw my partner, who gave her some Voltaren gel, and this really has not helped at all.  Intervally since last time I saw her her range of motion has significantly decreased.  She is lost a lot of extension and she has pain in her left knee much of the time.  She is altered how she gets up from a seated position as well as going up and down stairs.  She has trouble getting in and out of a car.  She intermittently has had some effusions.  Past Medical History, Surgical History, Social History, Family History, Problem List, Medications, and Allergies have been reviewed and updated if relevant.  Patient Active Problem List   Diagnosis Date Noted  . Left medial knee pain 12/19/2017  . 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 Rash and nonspecific skin eruption 02/04/2016  . 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 Counseling regarding end of life decision making 12/10/2014  . Osteoarthritis of both knees 11/07/2013  . Allergic rhinitis 11/14/2010  .  Prediabetes 02/15/2009  . HYPERCHOLESTEROLEMIA, PURE 08/21/2006  . HYPERTENSION, BENIGN ESSENTIAL 08/21/2006  . Osteopenia 08/21/2006    Past Medical History:  Diagnosis Date  . Hypertension     Past Surgical History:  Procedure Laterality Date  . SHOULDER SURGERY     right  . TUBAL LIGATION      Social History   Socioeconomic History  . Marital status: Widowed    Spouse name: Not on file  . Number of children: 3  . Years of education: Not on file  . Highest education level: Not on file  Occupational History  . Occupation: retired day Counselling psychologist: RETIRED  Social Needs  . Financial resource strain: Not on file  . Food insecurity:    Worry: Not on file    Inability: Not on file  . Transportation needs:    Medical: Not on file    Non-medical: Not on file  Tobacco Use  . Smoking status: Never Smoker  . Smokeless tobacco: Never Used  Substance and Sexual Activity  . Alcohol use: No  . Drug use: No  . Sexual activity: Never  Lifestyle  . Physical activity:    Days per week: Not on file    Minutes per session: Not on file  . Stress: Not on file  Relationships  . Social connections:    Talks on phone: Not on file    Gets together: Not on file    Attends religious service: Not  on file    Active member of club or organization: Not on file    Attends meetings of clubs or organizations: Not on file    Relationship status: Not on file  . Intimate partner violence:    Fear of current or ex partner: Not on file    Emotionally abused: Not on file    Physically abused: Not on file    Forced sexual activity: Not on file  Other Topics Concern  . Not on file  Social History Narrative   Regular exercise--minimal   She is working on setting up end of life planning.   No living will. Full code (reviewed 2013)                Family History  Problem Relation Age of Onset  . Hypertension Mother   . Diabetes Mother   . Hypertension Father   . Diabetes  Sister   . Diabetes Brother   . Hypothyroidism Daughter     Allergies  Allergen Reactions  . Penicillins     REACTION: hallucinations    Medication list reviewed and updated in full in [place].  GEN: No fevers, chills. Nontoxic. Primarily MSK c/o today. MSK: Detailed in the HPI GI: tolerating PO intake without difficulty Neuro: No numbness, parasthesias, or tingling associated. Otherwise the pertinent positives of the ROS are noted above.   Objective:   BP 120/60   Pulse 80   Temp 98.5 F (36.9 C) (Oral)   Ht 5' 1.25" (1.556 m)   Wt 156 lb 8 oz (71 kg)   BMI 29.33 kg/m    GEN: WDWN, NAD, Non-toxic, Alert & Oriented x 3 HEENT: Atraumatic, Normocephalic.  Ears and Nose: No external deformity. EXTR: No clubbing/cyanosis/edema NEURO: Normal gait. antalgia PSYCH: Normally interactive. Conversant. Not depressed or anxious appearing.  Calm demeanor.    Left knee: There is minimal effusion.  The patient lacks 8 degrees of extension.  Flexion to 95 degrees.  Minimal motion at the patella.  Patient has notable medial and lateral joint line tenderness.  She also has some tenderness with varus and valgus stress, but MCL and LCL are intact.  ACL and PCL are intact.  She does have pain with flexion pinch testing as well as McMurray's testing as well.  Neurovascularly intact.  Radiology: X-rays: AP Bilateral Weight-bearing, Weightbearing Lateral, Sunrise views Indication: knee pain Findings: The patient has bone-on-bone pathology medially and moderate to advanced tricompartmental osteoarthritis overall.  Significant subchondral sclerosis in the medial compartment as well. Electronically Signed  By: Owens Loffler, MD On: 01/16/2018 4:47 PM   Assessment and Plan:   Primary osteoarthritis of left knee - Plan: DG Knee 4 Views W/Patella Left, methylPREDNISolone acetate (DEPO-MEDROL) injection 80 mg  Primary osteoarthritis of both knees  Advanced knee left osteoarthritis.   She really is at a level where total knee arthroplasty would be a potential option.  She is not ready for this at this point, and she is never had any injections.  Topical Voltaren did not really help very much.  Encouraged her to take some Tylenol, and also gave her some tramadol which she can use on a as needed basis.  Organ to do an intra-articular injection with some steroids today to see if this calms down her knee.  Knee Injection, L Date of procedure: 01/16/2018 Patient verbally consented to procedure. Risks (including potential rare risk of infection), benefits, and alternatives explained. Sterilely prepped with Chloraprep. Ethyl cholride used for anesthesia. 8  cc Lidocaine 1% mixed with 2 mL Depo-Medrol 40 mg injected using the anteromedial approach without difficulty. No complications with procedure and tolerated well. Patient had decreased pain post-injection. Medication: 2 mL of Depo-Medrol 40 mg, equaling Depo-Medrol 80 mg total   Follow-up: No follow-ups on file.  Meds ordered this encounter  Medications  . traMADol (ULTRAM) 50 MG tablet    Sig: Take 1 tablet (50 mg total) by mouth every 6 (six) hours as needed for up to 7 days.    Dispense:  20 tablet    Refill:  0  . methylPREDNISolone acetate (DEPO-MEDROL) injection 80 mg   Orders Placed This Encounter  Procedures  . DG Knee 4 Views W/Patella Left    Signed,  Deiondra Denley T. Freya Zobrist, MD   Outpatient Encounter Medications as of 01/16/2018  Medication Sig  . diclofenac sodium (VOLTAREN) 1 % GEL Apply 4 g topically 4 (four) times daily.  . hydrochlorothiazide (MICROZIDE) 12.5 MG capsule TAKE 1 CAPSULE BY MOUTH EVERY DAY  . levothyroxine (SYNTHROID, LEVOTHROID) 75 MCG tablet Take 1 tablet (75 mcg total) by mouth daily.  Marland Kitchen losartan (COZAAR) 25 MG tablet Take 1 tablet (25 mg total) by mouth daily. Take 2 tablets by mouth once daily. (total of 50mg  daily.)  . Multiple Vitamins-Minerals (CENTRUM SILVER ADULT 50+ PO) Take 1  tablet by mouth daily.  . pravastatin (PRAVACHOL) 40 MG tablet TAKE 1 TABLET BY MOUTH EVERY DAY  . traMADol (ULTRAM) 50 MG tablet Take 1 tablet (50 mg total) by mouth every 6 (six) hours as needed for up to 7 days.  . [EXPIRED] methylPREDNISolone acetate (DEPO-MEDROL) injection 80 mg    No facility-administered encounter medications on file as of 01/16/2018.

## 2018-01-16 ENCOUNTER — Ambulatory Visit (INDEPENDENT_AMBULATORY_CARE_PROVIDER_SITE_OTHER)
Admission: RE | Admit: 2018-01-16 | Discharge: 2018-01-16 | Disposition: A | Discharge status: Home / Self Care | Payer: Medicare Other | Source: Ambulatory Visit | Attending: Family Medicine | Admitting: Family Medicine

## 2018-01-16 ENCOUNTER — Encounter: Payer: Self-pay | Admitting: Family Medicine

## 2018-01-16 ENCOUNTER — Ambulatory Visit (INDEPENDENT_AMBULATORY_CARE_PROVIDER_SITE_OTHER): Payer: Medicare Other | Admitting: Family Medicine

## 2018-01-16 ENCOUNTER — Ambulatory Visit: Payer: Medicare Other | Admitting: Family Medicine

## 2018-01-16 VITALS — BP 120/60 | HR 80 | Temp 98.5°F | Ht 61.25 in | Wt 156.5 lb

## 2018-01-16 DIAGNOSIS — M25462 Effusion, left knee: Secondary | ICD-10-CM | POA: Diagnosis not present

## 2018-01-16 DIAGNOSIS — M1712 Unilateral primary osteoarthritis, left knee: Secondary | ICD-10-CM

## 2018-01-16 DIAGNOSIS — M170 Bilateral primary osteoarthritis of knee: Secondary | ICD-10-CM

## 2018-01-16 MED ORDER — TRAMADOL HCL 50 MG PO TABS: 50.0000 mg | ORAL_TABLET | Freq: Four times a day (QID) | ORAL | 0 refills | Status: AC | PRN

## 2018-01-16 MED ORDER — METHYLPREDNISOLONE ACETATE 40 MG/ML IJ SUSP
80.0000 mg | Freq: Once | INTRAMUSCULAR | Status: AC
  Administered 2018-01-16: 80 mg via INTRA_ARTICULAR

## 2018-01-28 ENCOUNTER — Ambulatory Visit (INDEPENDENT_AMBULATORY_CARE_PROVIDER_SITE_OTHER): Payer: Medicare Other

## 2018-01-28 VITALS — BP 150/80 | HR 81 | Temp 97.8°F | Ht 62.75 in | Wt 157.0 lb

## 2018-01-28 DIAGNOSIS — R7303 Prediabetes: Secondary | ICD-10-CM

## 2018-01-28 DIAGNOSIS — I10 Essential (primary) hypertension: Secondary | ICD-10-CM | POA: Diagnosis not present

## 2018-01-28 DIAGNOSIS — Z0000 Encounter for general adult medical examination without abnormal findings: Secondary | ICD-10-CM | POA: Diagnosis not present

## 2018-01-28 DIAGNOSIS — E7800 Pure hypercholesterolemia, unspecified: Secondary | ICD-10-CM

## 2018-01-28 DIAGNOSIS — M8580 Other specified disorders of bone density and structure, unspecified site: Secondary | ICD-10-CM

## 2018-01-28 LAB — LIPID PANEL
CHOLESTEROL: 221 mg/dL — AB (ref 0–200)
HDL: 97.5 mg/dL (ref >39.00)
LDL Cholesterol: 89 mg/dL (ref 0–99)
NonHDL: 123.69
Total CHOL/HDL Ratio: 2
Triglycerides: 172 mg/dL — ABNORMAL HIGH (ref 0.0–149.0)
VLDL: 34.4 mg/dL (ref 0.0–40.0)

## 2018-01-28 LAB — COMPREHENSIVE METABOLIC PANEL
ALT: 16 U/L (ref 0–35)
AST: 18 U/L (ref 0–37)
Albumin: 4 g/dL (ref 3.5–5.2)
Alkaline Phosphatase: 47 U/L (ref 39–117)
BUN: 12 mg/dL (ref 6–23)
CO2: 27 mEq/L (ref 19–32)
Calcium: 9.6 mg/dL (ref 8.4–10.5)
Chloride: 103 mEq/L (ref 96–112)
Creatinine, Ser: 0.99 mg/dL (ref 0.40–1.20)
GFR: 65.44 mL/min (ref >60.00)
Glucose, Bld: 178 mg/dL — ABNORMAL HIGH (ref 70–99)
Potassium: 3.4 mEq/L — ABNORMAL LOW (ref 3.5–5.1)
Sodium: 140 mEq/L (ref 135–145)
Total Bilirubin: 0.8 mg/dL (ref 0.2–1.2)
Total Protein: 7.2 g/dL (ref 6.0–8.3)

## 2018-01-28 LAB — VITAMIN D 25 HYDROXY (VIT D DEFICIENCY, FRACTURES): VITD: 41.4 ng/mL (ref 30.00–100.00)

## 2018-01-28 LAB — HEMOGLOBIN A1C: Hgb A1c MFr Bld: 6 % (ref 4.6–6.5)

## 2018-01-28 NOTE — Progress Notes (Signed)
Subjective:   Alexandria Petersen is a 78 y.o. female who presents for Medicare Annual (Subsequent) preventive examination.  Review of Systems:  N/A Cardiac Risk Factors include: advanced age (>54men, >43 women);dyslipidemia;hypertension     Objective:     Vitals: BP (!) 150/80 (BP Location: Right Arm, Patient Position: Sitting, Cuff Size: Normal) Comment: diet - caffeinated coffee  Pulse 81   Temp 97.8 F (36.6 C) (Oral)   Ht 5' 2.75" (1.594 m) Comment: shoes  Wt 157 lb (71.2 kg)   SpO2 98%   BMI 28.03 kg/m   Body mass index is 28.03 kg/m.  Advanced Directives 01/28/2018 12/25/2016 12/21/2015  Does Patient Have a Medical Advance Directive? No No No  Would patient like information on creating a medical advance directive? No - Patient declined No - Patient declined -    Tobacco Social History   Tobacco Use  Smoking Status Never Smoker  Smokeless Tobacco Never Used     Counseling given: No   Clinical Intake:  Pre-visit preparation completed: Yes  Pain : No/denies pain Pain Score: 3      Nutritional Status: BMI 25 -29 Overweight Nutritional Risks: None Diabetes: No  How often do you need to have someone help you when you read instructions, pamphlets, or other written materials from your doctor or pharmacy?: 1 - Never What is the last grade level you completed in school?: 12th grade  Interpreter Needed?: No  Comments: pt is a widow and lives alone Information entered by :: LPinson, LPN  Past Medical History:  Diagnosis Date  . Hypertension    Past Surgical History:  Procedure Laterality Date  . SHOULDER SURGERY     right  . TUBAL LIGATION     Family History  Problem Relation Age of Onset  . Hypertension Mother   . Diabetes Mother   . Hypertension Father   . Diabetes Sister   . Diabetes Brother   . Hypothyroidism Daughter    Social History   Socioeconomic History  . Marital status: Widowed    Spouse name: Not on file  . Number of children:  3  . Years of education: Not on file  . Highest education level: Not on file  Occupational History  . Occupation: retired day Counselling psychologist: RETIRED  Social Needs  . Financial resource strain: Not on file  . Food insecurity:    Worry: Not on file    Inability: Not on file  . Transportation needs:    Medical: Not on file    Non-medical: Not on file  Tobacco Use  . Smoking status: Never Smoker  . Smokeless tobacco: Never Used  Substance and Sexual Activity  . Alcohol use: No  . Drug use: No  . Sexual activity: Never  Lifestyle  . Physical activity:    Days per week: Not on file    Minutes per session: Not on file  . Stress: Not on file  Relationships  . Social connections:    Talks on phone: Not on file    Gets together: Not on file    Attends religious service: Not on file    Active member of club or organization: Not on file    Attends meetings of clubs or organizations: Not on file    Relationship status: Not on file  Other Topics Concern  . Not on file  Social History Narrative   Regular exercise--minimal   She is working on setting up end of life  planning.   No living will. Full code (reviewed 2013)                Outpatient Encounter Medications as of 01/28/2018  Medication Sig  . hydrochlorothiazide (MICROZIDE) 12.5 MG capsule TAKE 1 CAPSULE BY MOUTH EVERY DAY  . levothyroxine (SYNTHROID, LEVOTHROID) 75 MCG tablet Take 1 tablet (75 mcg total) by mouth daily.  Marland Kitchen losartan (COZAAR) 25 MG tablet Take 1 tablet (25 mg total) by mouth daily. Take 2 tablets by mouth once daily. (total of 50mg  daily.)  . Multiple Vitamins-Minerals (CENTRUM SILVER ADULT 50+ PO) Take 1 tablet by mouth daily.  . pravastatin (PRAVACHOL) 40 MG tablet TAKE 1 TABLET BY MOUTH EVERY DAY  . [DISCONTINUED] diclofenac sodium (VOLTAREN) 1 % GEL Apply 4 g topically 4 (four) times daily.   No facility-administered encounter medications on file as of 01/28/2018.     Activities of Daily  Living In your present state of health, do you have any difficulty performing the following activities: 01/28/2018  Hearing? N  Vision? N  Difficulty concentrating or making decisions? N  Walking or climbing stairs? N  Dressing or bathing? N  Doing errands, shopping? N  Preparing Food and eating ? N  Using the Toilet? N  In the past six months, have you accidently leaked urine? N  Do you have problems with loss of bowel control? N  Managing your Medications? N  Managing your Finances? N  Housekeeping or managing your Housekeeping? N  Some recent data might be hidden    Patient Care Team: Alexandria Sanders, MD as PCP - General    Assessment:   This is a routine wellness examination for Alexandria Petersen.   Hearing Screening   125Hz  250Hz  500Hz  1000Hz  2000Hz  3000Hz  4000Hz  6000Hz  8000Hz   Right ear:   0 0 40  40    Left ear:   40 40 40  40    Vision Screening Comments: Vision exam in Feb 2019 with Dr. Gloriann Petersen   Exercise Activities and Dietary recommendations Current Exercise Habits: The patient does not participate in regular exercise at present, Exercise limited by: None identified  Goals    . Patient Stated     Starting 01/28/18, I will continue to take medications as prescribed.        Fall Risk Fall Risk  01/28/2018 12/25/2016 12/21/2015 12/10/2014 11/07/2013  Falls in the past year? 0 No No No No   Depression Screen PHQ 2/9 Scores 01/28/2018 12/25/2016 12/21/2015 12/10/2014  PHQ - 2 Score 0 0 0 0  PHQ- 9 Score 0 1 - -     Cognitive Function MMSE - Mini Mental State Exam 01/28/2018 12/25/2016  Orientation to time 5 5  Orientation to Place 5 5  Registration 3 3  Attention/ Calculation 0 0  Recall 1 1  Recall-comments unable to recall 2 of 3 words unable to recall 2 of 3 words  Language- name 2 objects 0 0  Language- repeat 1 1  Language- follow 3 step command 3 3  Language- read & follow direction 0 0  Write a sentence 0 0  Copy design 0 0  Total score 18 18     PLEASE  NOTE: A Mini-Cog screen was completed. Maximum score is 20. A value of 0 denotes this part of Folstein MMSE was not completed or the patient failed this part of the Mini-Cog screening.   Mini-Cog Screening Orientation to Time - Max 5 pts Orientation to Place - Max 5 pts  Registration - Max 3 pts Recall - Max 3 pts Language Repeat - Max 1 pts Language Follow 3 Step Command - Max 3 pts     Immunization History  Administered Date(s) Administered  . Influenza Whole 10/16/2008, 10/06/2009  . Influenza, High Dose Seasonal PF 10/28/2015, 10/26/2016  . Influenza,inj,Quad PF,6+ Mos 11/05/2012, 10/30/2013, 10/30/2014, 10/25/2017  . PPD Test 04/21/2014  . Pneumococcal Conjugate-13 12/10/2014  . Pneumococcal Polysaccharide-23 10/26/2009  . Td 08/29/2005  . Tdap 04/05/2010  . Zoster 11/09/2009     Screening Tests Health Maintenance  Topic Date Due  . MAMMOGRAM  02/23/2018  . TETANUS/TDAP  04/04/2020  . INFLUENZA VACCINE  Completed  . DEXA SCAN  Completed  . PNA vac Low Risk Adult  Completed      Plan:     I have personally reviewed, addressed, and noted the following in the patient's chart:  A. Medical and social history B. Use of alcohol, tobacco or illicit drugs  C. Current medications and supplements D. Functional ability and status E.  Nutritional status F.  Physical activity G. Advance directives H. List of other physicians I.  Hospitalizations, surgeries, and ER visits in previous 12 months J.  [place] to include hearing, vision, cognitive, depression L. Referrals and appointments - none  In addition, I have reviewed and discussed with patient certain preventive protocols, quality metrics, and best practice recommendations. A written personalized care plan for preventive services as well as general preventive health recommendations were provided to patient.  See attached scanned questionnaire for additional information.   Signed,   Lindell Noe, MHA, BS,  LPN Health Coach

## 2018-01-28 NOTE — Progress Notes (Signed)
PCP notes:   Health maintenance:  No gaps identified.  Abnormal screenings:   Mini-Cog score: 18/20 MMSE - Mini Mental State Exam 01/28/2018 12/25/2016  Orientation to time 5 5  Orientation to Place 5 5  Registration 3 3  Attention/ Calculation 0 0  Recall 1 1  Recall-comments unable to recall 2 of 3 words unable to recall 2 of 3 words  Language- name 2 objects 0 0  Language- repeat 1 1  Language- follow 3 step command 3 3  Language- read & follow direction 0 0  Write a sentence 0 0  Copy design 0 0  Total score 18 18   Hearing - failed  Hearing Screening   125Hz  250Hz  500Hz  1000Hz  2000Hz  3000Hz  4000Hz  6000Hz  8000Hz   Right ear:   0 0 40  40    Left ear:   40 40 40  40     Patient concerns:   None  Nurse concerns:  Please consider patient for a nutrition referral due to prediabetes and HTN.  Patient had multiple questions about her diet.  Next PCP appt:   02/01/18 @ 1400

## 2018-01-28 NOTE — Progress Notes (Signed)
I reviewed health advisor's note, was available for consultation, and agree with documentation and plan.   Signed,  Kagan Mutchler T. Obdulia Steier, MD  

## 2018-01-28 NOTE — Patient Instructions (Signed)
Ms. Alkhatib , Thank you for taking time to come for your Medicare Wellness Visit. I appreciate your ongoing commitment to your health goals. Please review the following plan we discussed and let me know if I can assist you in the future.   These are the goals we discussed: Goals    . Patient Stated     Starting 01/28/18, I will continue to take medications as prescribed.        This is a list of the screening recommended for you and due dates:  Health Maintenance  Topic Date Due  . Mammogram  02/23/2018  . Tetanus Vaccine  04/04/2020  . Flu Shot  Completed  . DEXA scan (bone density measurement)  Completed  . Pneumonia vaccines  Completed   Preventive Care for Adults  A healthy lifestyle and preventive care can promote health and wellness. Preventive health guidelines for adults include the following key practices.  . A routine yearly physical is a good way to check with your health care provider about your health and preventive screening. It is a chance to share any concerns and updates on your health and to receive a thorough exam.  . Visit your dentist for a routine exam and preventive care every 6 months. Brush your teeth twice a day and floss once a day. Good oral hygiene prevents tooth decay and gum disease.  . The frequency of eye exams is based on your age, health, family medical history, use  of contact lenses, and other factors. Follow your health care provider's recommendations for frequency of eye exams.  . Eat a healthy diet. Foods like vegetables, fruits, whole grains, low-fat dairy products, and lean protein foods contain the nutrients you need without too many calories. Decrease your intake of foods high in solid fats, added sugars, and salt. Eat the right amount of calories for you. Get information about a proper diet from your health care provider, if necessary.  . Regular physical exercise is one of the most important things you can do for your health. Most adults  should get at least 150 minutes of moderate-intensity exercise (any activity that increases your heart rate and causes you to sweat) each week. In addition, most adults need muscle-strengthening exercises on 2 or more days a week.  Silver Sneakers may be a benefit available to you. To determine eligibility, you may visit the website: www.silversneakers.com or contact program at 229 301 3398 Mon-Fri between 8AM-8PM.   . Maintain a healthy weight. The body mass index (BMI) is a screening tool to identify possible weight problems. It provides an estimate of body fat based on height and weight. Your health care provider can find your BMI and can help you achieve or maintain a healthy weight.   For adults 20 years and older: ? A BMI below 18.5 is considered underweight. ? A BMI of 18.5 to 24.9 is normal. ? A BMI of 25 to 29.9 is considered overweight. ? A BMI of 30 and above is considered obese.   . Maintain normal blood lipids and cholesterol levels by exercising and minimizing your intake of saturated fat. Eat a balanced diet with plenty of fruit and vegetables. Blood tests for lipids and cholesterol should begin at age 30 and be repeated every 5 years. If your lipid or cholesterol levels are high, you are over 50, or you are at high risk for heart disease, you may need your cholesterol levels checked more frequently. Ongoing high lipid and cholesterol levels should be treated with  medicines if diet and exercise are not working.  . If you smoke, find out from your health care provider how to quit. If you do not use tobacco, please do not start.  . If you choose to drink alcohol, please do not consume more than 2 drinks per day. One drink is considered to be 12 ounces (355 mL) of beer, 5 ounces (148 mL) of wine, or 1.5 ounces (44 mL) of liquor.  . If you are 62-32 years old, ask your health care provider if you should take aspirin to prevent strokes.  . Use sunscreen. Apply sunscreen liberally and  repeatedly throughout the day. You should seek shade when your shadow is shorter than you. Protect yourself by wearing long sleeves, pants, a wide-brimmed hat, and sunglasses year round, whenever you are outdoors.  . Once a month, do a whole body skin exam, using a mirror to look at the skin on your back. Tell your health care provider of new moles, moles that have irregular borders, moles that are larger than a pencil eraser, or moles that have changed in shape or color.

## 2018-02-01 ENCOUNTER — Ambulatory Visit (INDEPENDENT_AMBULATORY_CARE_PROVIDER_SITE_OTHER): Payer: Medicare Other | Admitting: Family Medicine

## 2018-02-01 ENCOUNTER — Encounter: Payer: Self-pay | Admitting: Family Medicine

## 2018-02-01 VITALS — BP 130/60 | HR 97 | Temp 99.0°F | Ht 62.75 in | Wt 156.5 lb

## 2018-02-01 DIAGNOSIS — E7800 Pure hypercholesterolemia, unspecified: Secondary | ICD-10-CM | POA: Diagnosis not present

## 2018-02-01 DIAGNOSIS — Z1211 Encounter for screening for malignant neoplasm of colon: Secondary | ICD-10-CM

## 2018-02-01 DIAGNOSIS — I10 Essential (primary) hypertension: Secondary | ICD-10-CM

## 2018-02-01 DIAGNOSIS — R7303 Prediabetes: Secondary | ICD-10-CM

## 2018-02-01 DIAGNOSIS — Z0000 Encounter for general adult medical examination without abnormal findings: Secondary | ICD-10-CM

## 2018-02-01 NOTE — Assessment & Plan Note (Signed)
Work on low carb diet, get back to exercise as able with Bilateral knee OA.

## 2018-02-01 NOTE — Patient Instructions (Addendum)
Work on low carb diet, increase fresh fruit and veggies.  Stop orange juice.  Get back to regular exercise as able.  Schedule mammogram on your own.  Stop at lab for stool cards.

## 2018-02-01 NOTE — Assessment & Plan Note (Signed)
Well controlled. Continue current medication.  

## 2018-02-01 NOTE — Progress Notes (Signed)
Subjective:    Patient ID: Alexandria Petersen, female    DOB: 01/09/1941, 78 y.o.   MRN: [id number]  HPI  The patient presents for complete physical and review of chronic health problems. He/She also has the following acute concerns today: none Wt Readings from Last 3 Encounters:  02/01/18 156 lb 8 oz (71 kg)  01/28/18 157 lb (71.2 kg)  01/16/18 156 lb 8 oz (71 kg)    The patient saw Candis Musa, LPN for medicare wellness. Note reviewed in detail and important notes copied below. Health maintenance:  No gaps identified.  Abnormal screenings:   Mini-Cog score: 18/20 MMSE - Mini Mental State Exam 01/28/2018 12/25/2016  Orientation to time 5 5  Orientation to Place 5 5  Registration 3 3  Attention/ Calculation 0 0  Recall 1 1  Recall-comments unable to recall 2 of 3 words unable to recall 2 of 3 words  Language- name 2 objects 0 0  Language- repeat 1 1  Language- follow 3 step command 3 3  Language- read & follow direction 0 0  Write a sentence 0 0  Copy design 0 0  Total score 18 18   Hearing - failed             Hearing Screening   125Hz  250Hz  500Hz  1000Hz  2000Hz  3000Hz  4000Hz  6000Hz  8000Hz   Right ear:   0 0 40  40    Left ear:   40 40 40  40     Patient concerns:   None  02/01/18   Hypertension:   Good control on losartan and HCTZ BP Readings from Last 3 Encounters:  02/01/18 130/60  01/28/18 (!) 150/80  01/16/18 120/60  Using medication without problems or lightheadedness:  none Chest pain with exertion: none Edema:none Short of breath:none Average home BPs: Other issues: Prediabetes  Lab Results  Component Value Date   HGBA1C 6.0 01/28/2018    Body mass index is 27.94 kg/m.  Post ablation for hyperthyroidism.. Followed by Dr. Dwyane Dee endo. Hypothyroid:  Stable control on levothyroxine  Elevated Cholesterol:  LDL at goal on pravasatin Lab Results  Component Value Date   CHOL 221 (H) 01/28/2018   HDL 97.50 01/28/2018   LDLCALC 89 01/28/2018   LDLDIRECT 90.7 10/29/2012   TRIG 172.0 (H) 01/28/2018   CHOLHDL 2 01/28/2018  Using medications without problems: Muscle aches:  Diet compliance: moderate Exercise: leg exercises, when warmed she will do CV exercise. Other complaints:     Social History /Family History/Past Medical History reviewed in detail and updated in EMR if needed. Blood pressure 130/60, pulse 97, temperature 99 F (37.2 C), temperature source Oral, height 5' 2.75" (1.594 m), weight 156 lb 8 oz (71 kg), SpO2 97 %.  Review of Systems  Constitutional: Negative for fatigue and fever.  HENT: Negative for congestion.   Eyes: Negative for pain.  Respiratory: Negative for cough and shortness of breath.   Cardiovascular: Negative for chest pain, palpitations and leg swelling.  Gastrointestinal: Negative for abdominal pain.  Genitourinary: Negative for dysuria and vaginal bleeding.  Musculoskeletal: Positive for arthralgias. Negative for back pain.  Neurological: Negative for syncope, light-headedness and headaches.  Psychiatric/Behavioral: Negative for dysphoric mood.       Objective:   Physical Exam Constitutional:      General: She is not in acute distress.    Appearance: Normal appearance. She is well-developed. She is not ill-appearing or toxic-appearing.  HENT:     Head: Normocephalic.  Right Ear: Hearing, tympanic membrane, ear canal and external ear normal.     Left Ear: Hearing, tympanic membrane, ear canal and external ear normal.     Nose: Nose normal.  Eyes:     General: Lids are normal. Lids are everted, no foreign bodies appreciated.     Conjunctiva/sclera: Conjunctivae normal.     Pupils: Pupils are equal, round, and reactive to light.  Neck:     Musculoskeletal: Normal range of motion and neck supple.     Thyroid: No thyroid mass or thyromegaly.     Vascular: No carotid bruit.     Trachea: Trachea normal.  Cardiovascular:     Rate and Rhythm: Normal rate and  regular rhythm.     Heart sounds: Normal heart sounds, S1 normal and S2 normal. No murmur. No gallop.   Pulmonary:     Effort: Pulmonary effort is normal. No respiratory distress.     Breath sounds: Normal breath sounds. No wheezing, rhonchi or rales.  Abdominal:     General: Bowel sounds are normal. There is no distension or abdominal bruit.     Palpations: Abdomen is soft. There is no fluid wave or mass.     Tenderness: There is no abdominal tenderness. There is no guarding or rebound.     Hernia: No hernia is present.  Musculoskeletal:     Right knee: She exhibits decreased range of motion, swelling and deformity. Tenderness found.     Left knee: She exhibits decreased range of motion, swelling and deformity. Tenderness found.  Lymphadenopathy:     Cervical: No cervical adenopathy.  Skin:    General: Skin is warm and dry.     Findings: No rash.  Neurological:     Mental Status: She is alert.     Cranial Nerves: No cranial nerve deficit.     Sensory: No sensory deficit.  Psychiatric:        Mood and Affect: Mood is not anxious or depressed.        Speech: Speech normal.        Behavior: Behavior normal. Behavior is cooperative.        Judgment: Judgment normal.           Assessment & Plan:  The patient's preventative maintenance and recommended screening tests for an annual wellness exam were reviewed in full today. Brought up to date unless services declined.  Counselled on the importance of diet, exercise, and its role in overall health and mortality. The patient's FH and SH was reviewed, including their home life, tobacco status, and drug and alcohol status.   Vaccines:uptodate Pap/DVE:No pap indicated Mammo:scheduled 02/2018 Bone Density:02/2017 osteopenia Colon:yearly stool cards.. neg 01/2017 Smoking Status:nonsmoker

## 2018-02-01 NOTE — Assessment & Plan Note (Signed)
Well controlled. Continue current medication. Encouraged exercise, weight loss, healthy eating habits.  

## 2018-02-18 ENCOUNTER — Other Ambulatory Visit (INDEPENDENT_AMBULATORY_CARE_PROVIDER_SITE_OTHER): Payer: Medicare Other

## 2018-02-18 DIAGNOSIS — Z1211 Encounter for screening for malignant neoplasm of colon: Secondary | ICD-10-CM | POA: Diagnosis not present

## 2018-02-18 LAB — FECAL OCCULT BLOOD, IMMUNOCHEMICAL: Fecal Occult Bld: POSITIVE — AB

## 2018-02-19 ENCOUNTER — Telehealth: Payer: Self-pay | Admitting: *Deleted

## 2018-02-19 NOTE — Telephone Encounter (Signed)
Left message on voicemail for patient to call back.  Need to relay lab results to patient.

## 2018-02-20 NOTE — Telephone Encounter (Signed)
Per results note documented by Ozzie Hoyle:  Pt called back requesting lab results; pt notified as instructed; pt does not have hemorrhoids and has not had any constipated stools. Pt is agreeable to go for colonoscopy wherever Dr Diona Browner will send pt. Pt request cb about appt time and place any afternoon between 3 - 4 pm at [phone]. FYI to Dr Diona Browner for referral. If pt were to have active bleeding will go to ED.

## 2018-02-21 ENCOUNTER — Other Ambulatory Visit: Payer: Self-pay | Admitting: Family Medicine

## 2018-02-21 DIAGNOSIS — R195 Other fecal abnormalities: Secondary | ICD-10-CM

## 2018-02-26 ENCOUNTER — Ambulatory Visit
Admission: RE | Admit: 2018-02-26 | Discharge: 2018-02-26 | Disposition: A | Discharge status: Home / Self Care | Payer: Medicare Other | Source: Ambulatory Visit | Attending: Family Medicine | Admitting: Family Medicine

## 2018-02-26 DIAGNOSIS — Z1231 Encounter for screening mammogram for malignant neoplasm of breast: Secondary | ICD-10-CM

## 2018-03-04 ENCOUNTER — Telehealth: Payer: Self-pay | Admitting: Family Medicine

## 2018-03-04 ENCOUNTER — Encounter: Payer: Self-pay | Admitting: Gastroenterology

## 2018-03-04 NOTE — Telephone Encounter (Signed)
Appt made and patient is aware. °

## 2018-03-04 NOTE — Telephone Encounter (Signed)
Patient spoke to Thomas and Bonnita Nasuti told patient if she wasn't available, to ask for [facility].  Patient said she wanted to wait to make an appointment with GI until she spoke to her daughter.  Patient spoke to her daughter and would like to make the appointment with [facility]. Patient requested a call back anytime after 2:00. I let patient know [name] would send the request to GI and GI would return patient's call.

## 2018-03-08 ENCOUNTER — Other Ambulatory Visit: Payer: Self-pay | Admitting: Family Medicine

## 2018-03-14 ENCOUNTER — Ambulatory Visit: Payer: Medicare Other | Admitting: Gastroenterology

## 2018-03-14 ENCOUNTER — Encounter: Payer: Self-pay | Admitting: Gastroenterology

## 2018-03-14 VITALS — BP 136/60 | HR 80 | Ht 61.75 in | Wt 154.4 lb

## 2018-03-14 DIAGNOSIS — R195 Other fecal abnormalities: Secondary | ICD-10-CM

## 2018-03-14 MED ORDER — NA SULFATE-K SULFATE-MG SULF 17.5-3.13-1.6 GM/177ML PO SOLN: 1.0000 | Freq: Once | ORAL | 0 refills | Status: AC

## 2018-03-14 NOTE — Patient Instructions (Signed)
You have been scheduled for a colonoscopy. Please follow written instructions given to you at your visit today.  Please pick up your prep supplies at the pharmacy within the next 1-3 days. If you use inhalers (even only as needed), please bring them with you on the day of your procedure. Your physician has requested that you go to www.startemmi.com and enter the access code given to you at your visit today. This web site gives a general overview about your procedure. However, you should still follow specific instructions given to you by our office regarding your preparation for the procedure.  Thank you for choosing me and [facility].  Malcolm T. Stark, Jr., MD., FACG  

## 2018-03-14 NOTE — Progress Notes (Signed)
History of Present Illness: This is a 78 year old female referred by Jinny Sanders, MD for the evaluation of occult blood in stool (FIT+). She is accompanied by her daughter. She may have had a colonoscopy years ago however she does not recall. Epic has a HM colonoscopy in 2011 however no report. No GI complaints. Denies weight loss, abdominal pain, constipation, diarrhea, change in stool caliber, melena, hematochezia, nausea, vomiting, dysphagia, reflux symptoms, chest pain.   Allergies  Allergen Reactions  . Penicillins     REACTION: hallucinations   Outpatient Medications Prior to Visit  Medication Sig Dispense Refill  . acetaminophen (TYLENOL) 650 MG CR tablet Take 650 mg by mouth every 8 (eight) hours as needed for pain.    . hydrochlorothiazide (MICROZIDE) 12.5 MG capsule TAKE 1 CAPSULE BY MOUTH EVERY DAY 90 capsule 1  . levothyroxine (SYNTHROID, LEVOTHROID) 75 MCG tablet Take 1 tablet (75 mcg total) by mouth daily. 90 tablet 1  . losartan (COZAAR) 25 MG tablet Take 1 tablet (25 mg total) by mouth daily. Take 2 tablets by mouth once daily. (total of 50mg  daily.) 60 tablet 3  . Multiple Vitamins-Minerals (CENTRUM SILVER ADULT 50+ PO) Take 1 tablet by mouth daily.    . pravastatin (PRAVACHOL) 40 MG tablet TAKE 1 TABLET BY MOUTH EVERY DAY 90 tablet 0  . traMADol (ULTRAM) 50 MG tablet Take 50 mg by mouth every 6 (six) hours as needed.     No facility-administered medications prior to visit.    Past Medical History:  Diagnosis Date  . Arthritis   . HLD (hyperlipidemia)   . Hypertension   . Hypothyroidism    Past Surgical History:  Procedure Laterality Date  . BREAST BIOPSY Left   . HEMORROIDECTOMY     ?  . ROTATOR CUFF REPAIR Right   . TUBAL LIGATION     Social History   Socioeconomic History  . Marital status: Widowed    Spouse name: Not on file  . Number of children: 3  . Years of education: Not on file  . Highest education level: Not on file  Occupational History   . Occupation: retired day Counselling psychologist: RETIRED  Social Needs  . Financial resource strain: Not on file  . Food insecurity:    Worry: Not on file    Inability: Not on file  . Transportation needs:    Medical: Not on file    Non-medical: Not on file  Tobacco Use  . Smoking status: Never Smoker  . Smokeless tobacco: Never Used  Substance and Sexual Activity  . Alcohol use: No  . Drug use: No  . Sexual activity: Never  Lifestyle  . Physical activity:    Days per week: Not on file    Minutes per session: Not on file  . Stress: Not on file  Relationships  . Social connections:    Talks on phone: Not on file    Gets together: Not on file    Attends religious service: Not on file    Active member of club or organization: Not on file    Attends meetings of clubs or organizations: Not on file    Relationship status: Not on file  Other Topics Concern  . Not on file  Social History Narrative   Regular exercise--minimal   She is working on setting up end of life planning.   No living will. Full code (reviewed 2013)  Family History  Problem Relation Age of Onset  . Hypertension Mother   . Diabetes Mother   . Hypertension Father   . Diabetes Sister   . Diabetes Brother   . Hypothyroidism Daughter   . Heart disease Son   . Hypothyroidism Son       Review of Systems: Pertinent positive and negative review of systems were noted in the above HPI section. All other review of systems were otherwise negative.   Physical Exam: General: Well developed, well nourished, no acute distress Head: Normocephalic and atraumatic Eyes:  sclerae anicteric, EOMI Ears: Normal auditory acuity Mouth: No deformity or lesions Neck: Supple, no masses or thyromegaly Lungs: Clear throughout to auscultation Heart: Regular rate and rhythm; no murmurs, rubs or bruits Abdomen: Soft, non tender and non distended. No masses, hepatosplenomegaly or hernias noted. Normal Bowel  sounds Rectal: Deferred to colonoscopy Musculoskeletal: Symmetrical with no gross deformities  Skin: No lesions on visible extremities Pulses:  Normal pulses noted Extremities: No clubbing, cyanosis, edema or deformities noted Neurological: Alert oriented x 4, grossly nonfocal Cervical Nodes:  No significant cervical adenopathy Inguinal Nodes: No significant inguinal adenopathy Psychological:  Alert and cooperative. Normal mood and affect   Assessment and Recommendations:  1. FIT + stool. R/O colorectal neoplasms. Schedule colonoscopy.  The risks (including bleeding, perforation, infection, missed lesions, medication reactions and possible hospitalization or surgery if complications occur), benefits, and alternatives to colonoscopy with possible biopsy and possible polypectomy were discussed with the patient and they consent to proceed.    cc: Jinny Sanders, MD [address] [place], [place] [address]

## 2018-03-15 ENCOUNTER — Encounter: Payer: Self-pay | Admitting: Gastroenterology

## 2018-03-16 ENCOUNTER — Other Ambulatory Visit: Payer: Self-pay | Admitting: Endocrinology

## 2018-03-20 ENCOUNTER — Other Ambulatory Visit: Payer: Self-pay | Admitting: Endocrinology

## 2018-03-20 ENCOUNTER — Other Ambulatory Visit: Payer: Self-pay | Admitting: Family Medicine

## 2018-03-21 DIAGNOSIS — H25813 Combined forms of age-related cataract, bilateral: Secondary | ICD-10-CM | POA: Diagnosis not present

## 2018-03-25 ENCOUNTER — Other Ambulatory Visit: Payer: Self-pay

## 2018-03-25 MED ORDER — LEVOTHYROXINE SODIUM 75 MCG PO TABS: ORAL_TABLET | ORAL | 1 refills | Status: DC

## 2018-03-28 ENCOUNTER — Telehealth: Payer: Self-pay

## 2018-03-28 NOTE — Telephone Encounter (Signed)
Covid-19 travel screening questions  Have you traveled in the last 14 days? If yes where? No.  Do you now or have you had a fever in the last 14 days? No.  Do you have any respiratory symptoms of shortness of breath or cough now or in the last 14 days? No.  Do you have a medical history of Congestive Heart Failure?  Do you have a medical history of lung disease?  Do you have any family members or close contacts with diagnosed or suspected Covid-19? No.       Explained to patient that her care partner would be staying the car during the procedure tomorrow and that we would just call her to pick her up at time of discharge. Patient understands and agrees.

## 2018-03-29 ENCOUNTER — Other Ambulatory Visit: Payer: Self-pay

## 2018-03-29 ENCOUNTER — Encounter: Payer: Self-pay | Admitting: Gastroenterology

## 2018-03-29 ENCOUNTER — Ambulatory Visit (AMBULATORY_SURGERY_CENTER): Payer: Medicare Other | Admitting: Gastroenterology

## 2018-03-29 VITALS — BP 121/62 | HR 63 | Temp 96.7°F | Resp 9 | Ht 61.0 in | Wt 154.0 lb

## 2018-03-29 DIAGNOSIS — K635 Polyp of colon: Secondary | ICD-10-CM | POA: Diagnosis not present

## 2018-03-29 DIAGNOSIS — K5730 Diverticulosis of large intestine without perforation or abscess without bleeding: Secondary | ICD-10-CM | POA: Diagnosis not present

## 2018-03-29 DIAGNOSIS — K921 Melena: Secondary | ICD-10-CM | POA: Diagnosis not present

## 2018-03-29 DIAGNOSIS — R195 Other fecal abnormalities: Secondary | ICD-10-CM | POA: Diagnosis not present

## 2018-03-29 DIAGNOSIS — D123 Benign neoplasm of transverse colon: Secondary | ICD-10-CM

## 2018-03-29 DIAGNOSIS — D124 Benign neoplasm of descending colon: Secondary | ICD-10-CM

## 2018-03-29 DIAGNOSIS — D128 Benign neoplasm of rectum: Secondary | ICD-10-CM

## 2018-03-29 MED ORDER — SODIUM CHLORIDE 0.9 % IV SOLN: 500.0000 mL | Freq: Once | INTRAVENOUS | Status: DC

## 2018-03-29 NOTE — Op Note (Signed)
[facility] Patient Name: Alexandria Petersen Procedure Date: 03/29/2018 8:29 AM MRN: [id number] Endoscopist: Ladene Artist , MD Age: 78 Referring MD:  Date of Birth: 29-Aug-1940 Gender: Female Account #: [id number] Procedure:                Colonoscopy Indications:              Positive fecal immunochemical test Medicines:                Monitored Anesthesia Care Procedure:                Pre-Anesthesia Assessment:                           - Prior to the procedure, a History and Physical                            was performed, and patient medications and                            allergies were reviewed. The patient's tolerance of                            previous anesthesia was also reviewed. The risks                            and benefits of the procedure and the sedation                            options and risks were discussed with the patient.                            All questions were answered, and informed consent                            was obtained. Prior Anticoagulants: The patient has                            taken no previous anticoagulant or antiplatelet                            agents. ASA Grade Assessment: II - A patient with                            mild systemic disease. After reviewing the risks                            and benefits, the patient was deemed in                            satisfactory condition to undergo the procedure.                           After obtaining informed consent, the colonoscope  was passed under direct vision. Throughout the                            procedure, the patient's blood pressure, pulse, and                            oxygen saturations were monitored continuously. The                            Colonoscope was introduced through the anus and                            advanced to the the cecum, identified by                            appendiceal orifice and  ileocecal valve. The                            ileocecal valve, appendiceal orifice, and rectum                            were photographed. The quality of the bowel                            preparation was good after extensive lavage and                            suctioning. The patient tolerated the procedure                            well. The colonoscopy was somewhat difficult due to                            a tortuous colon. Scope In: 8:41:23 AM Scope Out: 9:04:04 AM Scope Withdrawal Time: 0 hours 16 minutes 45 seconds  Total Procedure Duration: 0 hours 22 minutes 41 seconds  Findings:                 The perianal and digital rectal examinations were                            normal.                           Three sessile polyps were found in the rectum,                            descending colon and transverse colon. The polyps                            were 5 to 7 mm in size. These polyps were removed                            with a cold snare. Resection and retrieval were  complete.                           Scattered medium-mouthed diverticula were found in                            the sigmoid colon, descending colon, hepatic                            flexure and ascending colon.                           The exam was otherwise without abnormality on                            direct and retroflexion views. Complications:            No immediate complications. Estimated blood loss:                            None. Estimated Blood Loss:     Estimated blood loss: none. Impression:               - Three 5 to 7 mm polyps in the rectum, in the                            descending colon and in the transverse colon,                            removed with a cold snare. Resected and retrieved.                           - Diverticulosis in the sigmoid colon, in the                            descending colon, at the hepatic flexure and in the                             ascending colon.                           - The examination was otherwise normal on direct                            and retroflexion views. Recommendation:           - Patient has a contact number available for                            emergencies. The signs and symptoms of potential                            delayed complications were discussed with the                            patient. Return to normal activities tomorrow.  Written discharge instructions were provided to the                            patient.                           - High fiber diet.                           - Continue present medications.                           - Await pathology results.                           - No repeat colonoscopy due to age. Ladene Artist, MD 03/29/2018 9:08:18 AM This report has been signed electronically.

## 2018-03-29 NOTE — Patient Instructions (Signed)
Continue a high fiber diet  YOU HAD AN ENDOSCOPIC PROCEDURE TODAY AT [facility]:   Refer to the procedure report that was given to you for any specific questions about what was found during the examination.  If the procedure report does not answer your questions, please call your gastroenterologist to clarify.  If you requested that your care partner not be given the details of your procedure findings, then the procedure report has been included in a sealed envelope for you to review at your convenience later.  YOU SHOULD EXPECT: Some feelings of bloating in the abdomen. Passage of more gas than usual.  Walking can help get rid of the air that was put into your GI tract during the procedure and reduce the bloating. If you had a lower endoscopy (such as a colonoscopy or flexible sigmoidoscopy) you may notice spotting of blood in your stool or on the toilet paper. If you underwent a bowel prep for your procedure, you may not have a normal bowel movement for a few days.  Please Note:  You might notice some irritation and congestion in your nose or some drainage.  This is from the oxygen used during your procedure.  There is no need for concern and it should clear up in a day or so.  SYMPTOMS TO REPORT IMMEDIATELY:   Following lower endoscopy (colonoscopy or flexible sigmoidoscopy):  Excessive amounts of blood in the stool  Significant tenderness or worsening of abdominal pains  Swelling of the abdomen that is new, acute  Fever of 100F or higher  For urgent or emergent issues, a gastroenterologist can be reached at any hour by calling [phone].   DIET:  We do recommend a small meal at first, but then you may proceed to your regular diet.  Drink plenty of fluids but you should avoid alcoholic beverages for 24 hours.  ACTIVITY:  You should plan to take it easy for the rest of today and you should NOT DRIVE or use heavy machinery until tomorrow (because of the sedation  medicines used during the test).    FOLLOW UP: Our staff will call the number listed on your records the next business day following your procedure to check on you and address any questions or concerns that you may have regarding the information given to you following your procedure. If we do not reach you, we will leave a message.  However, if you are feeling well and you are not experiencing any problems, there is no need to return our call.  We will assume that you have returned to your regular daily activities without incident.  If any biopsies were taken you will be contacted by phone or by letter within the next 1-3 weeks.  Please call us at [phone] if you have not heard about the biopsies in 3 weeks.    SIGNATURES/CONFIDENTIALITY: You and/or your care partner have signed paperwork which will be entered into your electronic medical record.  These signatures attest to the fact that that the information above on your After Visit Summary has been reviewed and is understood.  Full responsibility of the confidentiality of this discharge information lies with you and/or your care-partner.

## 2018-03-29 NOTE — Progress Notes (Signed)
Called to room to assist during endoscopic procedure.  Patient ID and intended procedure confirmed with present staff. Received instructions for my participation in the procedure from the performing physician.  

## 2018-03-29 NOTE — Progress Notes (Signed)
Report to PACU, RN, vss, BBS= Clear.  

## 2018-04-01 ENCOUNTER — Telehealth: Payer: Self-pay

## 2018-04-01 ENCOUNTER — Telehealth: Payer: Self-pay | Admitting: *Deleted

## 2018-04-01 NOTE — Telephone Encounter (Signed)
  Follow up Call-  Call back number 03/29/2018  Post procedure Call Back phone  # [phone]  Permission to leave phone message Yes  Some recent data might be hidden     Patient questions:  Do you have a fever, pain , or abdominal swelling? No. Pain Score  0 *  Have you tolerated food without any problems? Yes.    Have you been able to return to your normal activities? Yes.    Do you have any questions about your discharge instructions: Diet   No. Medications  No. Follow up visit  No.  Do you have questions or concerns about your Care? Yes.  Pt hasn't had a bowel movement yet and was concerned. Told her this was normal after procedure not to have one for a few days and to let us know if she has any further concerns.  Actions: * If pain score is 4 or above: No action needed, pain <4.

## 2018-04-01 NOTE — Telephone Encounter (Signed)
No voicemail - unable to leave message on f/u call.

## 2018-04-01 NOTE — Telephone Encounter (Signed)
First post procedure follow up call, no answer 

## 2018-04-01 NOTE — Telephone Encounter (Signed)
Pt is returning your call. Pt had a procedure on 3-20.

## 2018-04-03 ENCOUNTER — Encounter: Payer: Self-pay | Admitting: Gastroenterology

## 2018-06-05 ENCOUNTER — Other Ambulatory Visit (INDEPENDENT_AMBULATORY_CARE_PROVIDER_SITE_OTHER): Payer: Medicare Other

## 2018-06-05 DIAGNOSIS — E890 Postprocedural hypothyroidism: Secondary | ICD-10-CM

## 2018-06-05 LAB — T4, FREE: Free T4: 1.07 ng/dL (ref 0.60–1.60)

## 2018-06-05 LAB — TSH: TSH: 8.11 u[IU]/mL — ABNORMAL HIGH (ref 0.35–4.50)

## 2018-06-12 ENCOUNTER — Other Ambulatory Visit: Payer: Self-pay

## 2018-06-12 NOTE — Progress Notes (Signed)
Patient ID: Alexandria Petersen, female   DOB: 1940-06-14, 78 y.o.   MRN: [id number]                                                                                                               Reason for Appointment: Hypothyroidism, follow up     History of Present Illness:   Prior history: When she was first seen in 12/16 she had symptoms of weight loss,  palpitations, feeling tired, occasionally feeling warm and some weakness in her legs. Also was having decreased appetite.  She previously had suppressed TSH levels since at least 10/2013 Pretreatment labs showed increased free T4 and free T3 levels in 12/16  She was also found to have a goiter on exam initially.  Was started on Methimazole 5 mg twice a day in 12/2014   Her methimazole was tapered off and stopped in 09/2015 when her free T4 was low normal However without her methimazole she had a recurrence of her hyperthyroidism in 1/18; she was having symptoms of palpitations and fatigue She finally had her I-131 treatment on 05/02/16 with 18.1 mCi   RECENT history:  She had become hypothyroid in 07/2016 when she was having fatigue, increased weight and hoarseness She has been started on levothyroxine, initially 112 g and then this was reduced gradually  She has been on 75 mcg since 2019 Her TSH was low normal at 0.35 in 7/19 and since then she has been taking 6-1/2 tablets/week  She takes 1/2 tablet on Sundays  She does not complain of feeling unusually tired or lethargic She only feels a little stressed about the pandemic She tends to have chronic hair loss which is not new  Occasionally may feel cold in her feet but no overall cold intolerance Her weight has been about the same  She has been regular with taking her levothyroxine before breakfast with water  Her TSH is now high at 8.1, previous labs were done about 6 months ago  Wt Readings from Last 3 Encounters:  06/13/18 154 lb 12.8 oz (70.2 kg)  03/29/18  154 lb (69.9 kg)  03/14/18 154 lb 6 oz (70 kg)   Lab Results  Component Value Date   TSH 8.11 (H) 06/05/2018   TSH 2.10 12/05/2017   TSH 0.35 08/03/2017   FREET4 1.07 06/05/2018   FREET4 1.14 12/05/2017   FREET4 1.31 08/03/2017       Allergies as of 06/13/2018      Reactions   Penicillins    REACTION: hallucinations      Medication List       Accurate as of June 13, 2018  1:11 PM. If you have any questions, ask your nurse or doctor.        acetaminophen 650 MG CR tablet Commonly known as:  TYLENOL Take 650 mg by mouth every 8 (eight) hours as needed for pain.   CENTRUM SILVER ADULT 50+ PO Take 1 tablet by mouth daily.   hydrochlorothiazide 12.5 MG capsule Commonly known as:  MICROZIDE  TAKE 1 CAPSULE BY MOUTH EVERY DAY   levothyroxine 75 MCG tablet Commonly known as:  SYNTHROID Take 1 tablet by mouth every day except Sunday. On Sunday, take 1/2 tablet.   losartan 25 MG tablet Commonly known as:  COZAAR TAKE 2 TABLETS BY MOUTH ONCE DAILY. (TOTAL OF 50MG  DAILY.)   pravastatin 40 MG tablet Commonly known as:  PRAVACHOL TAKE 1 TABLET BY MOUTH EVERY DAY   traMADol 50 MG tablet Commonly known as:  ULTRAM Take 50 mg by mouth every 6 (six) hours as needed.           Past Medical History:  Diagnosis Date  . Arthritis   . HLD (hyperlipidemia)   . Hypertension   . Hypothyroidism   . Osteoporosis     Past Surgical History:  Procedure Laterality Date  . BREAST BIOPSY Left   . HEMORROIDECTOMY     ?  . ROTATOR CUFF REPAIR Right   . TUBAL LIGATION      Family History  Problem Relation Age of Onset  . Hypertension Mother   . Diabetes Mother   . Hypertension Father   . Diabetes Sister   . Diabetes Brother   . Hypothyroidism Daughter   . Heart disease Son   . Hypothyroidism Son     Social History:  reports that she has never smoked. She has never used smokeless tobacco. She reports that she does not drink alcohol or use drugs.  Allergies:   Allergies  Allergen Reactions  . Penicillins     REACTION: hallucinations     Review of Systems  Blood pressure is being treated with Hyzaar, followed by PCP:  BP Readings from Last 3 Encounters:  06/13/18 140/70  03/29/18 121/62  03/14/18 136/60      Examination:   BP 140/70 (BP Location: Left Arm, Patient Position: Sitting, Cuff Size: Normal)   Pulse 100   Ht 5\' 1"  (1.549 m)   Wt 154 lb 12.8 oz (70.2 kg)   SpO2 97%   BMI 29.25 kg/m   She looks well  Hypertension has been managed by her PCP although her last prescription of losartan is in my name    Assessment/Plan:  Post-ablative hypothyroidism since 7/18  She is taking relatively small dose of levothyroxine since 2019 Currently taking 6-1/2 tablets a week of 75 mg levothyroxine She is taking this regularly as directed with 1/2 tablet on Sundays  She may have mild nonspecific fatigue but does not think she has noticed any change recently No weight gain and no symptoms of hypothyroidism  This is despite her TSH being 8.1, she has not missed any doses recently  She is getting the equivalent of 69 mcg daily currently Will add 1 more tablet per week of her 75 mcg which will bring her total dose to about 80 mcg average  Given written instructions to take 7-1/2 tablets a week of levothyroxine She will follow-up in 3 months  Follow-up with PCP for hypertension  There are no Patient Instructions on file for this visit.     Elayne Snare 06/13/2018, 1:11 PM   Note: This office note was prepared with Dragon voice recognition system technology. Any transcriptional errors that result from this process are unintentional.

## 2018-06-13 ENCOUNTER — Encounter: Payer: Self-pay | Admitting: Endocrinology

## 2018-06-13 ENCOUNTER — Ambulatory Visit (INDEPENDENT_AMBULATORY_CARE_PROVIDER_SITE_OTHER): Payer: Medicare Other | Admitting: Endocrinology

## 2018-06-13 VITALS — BP 140/70 | HR 100 | Ht 61.0 in | Wt 154.8 lb

## 2018-06-13 DIAGNOSIS — E890 Postprocedural hypothyroidism: Secondary | ICD-10-CM

## 2018-08-31 ENCOUNTER — Other Ambulatory Visit: Payer: Self-pay | Admitting: Family Medicine

## 2018-09-11 ENCOUNTER — Telehealth: Payer: Self-pay | Admitting: Family Medicine

## 2018-09-11 DIAGNOSIS — E7800 Pure hypercholesterolemia, unspecified: Secondary | ICD-10-CM

## 2018-09-11 DIAGNOSIS — M8580 Other specified disorders of bone density and structure, unspecified site: Secondary | ICD-10-CM

## 2018-09-11 DIAGNOSIS — R7303 Prediabetes: Secondary | ICD-10-CM

## 2018-09-11 NOTE — Telephone Encounter (Signed)
-----   Message from Cloyd Stagers, RT sent at 09/04/2018 12:54 PM EDT ----- Regarding: Lab Orders for Friday 9.4.2020 Please place lab orders for Friday 9.4.2020 Thank you, Dyke Maes RT(R)

## 2018-09-12 ENCOUNTER — Other Ambulatory Visit: Payer: Self-pay

## 2018-09-13 ENCOUNTER — Other Ambulatory Visit (INDEPENDENT_AMBULATORY_CARE_PROVIDER_SITE_OTHER): Payer: Medicare Other

## 2018-09-13 DIAGNOSIS — M8580 Other specified disorders of bone density and structure, unspecified site: Secondary | ICD-10-CM | POA: Diagnosis not present

## 2018-09-13 DIAGNOSIS — R7303 Prediabetes: Secondary | ICD-10-CM

## 2018-09-13 DIAGNOSIS — E7800 Pure hypercholesterolemia, unspecified: Secondary | ICD-10-CM

## 2018-09-13 LAB — COMPREHENSIVE METABOLIC PANEL
ALT: 16 U/L (ref 0–35)
AST: 22 U/L (ref 0–37)
Albumin: 4.3 g/dL (ref 3.5–5.2)
Alkaline Phosphatase: 43 U/L (ref 39–117)
BUN: 19 mg/dL (ref 6–23)
CO2: 29 mEq/L (ref 19–32)
Calcium: 10.2 mg/dL (ref 8.4–10.5)
Chloride: 102 mEq/L (ref 96–112)
Creatinine, Ser: 1 mg/dL (ref 0.40–1.20)
GFR: 64.58 mL/min (ref >60.00)
Glucose, Bld: 102 mg/dL — ABNORMAL HIGH (ref 70–99)
Potassium: 4 mEq/L (ref 3.5–5.1)
Sodium: 141 mEq/L (ref 135–145)
Total Bilirubin: 1.2 mg/dL (ref 0.2–1.2)
Total Protein: 7.5 g/dL (ref 6.0–8.3)

## 2018-09-13 LAB — LIPID PANEL
Cholesterol: 222 mg/dL — ABNORMAL HIGH (ref 0–200)
HDL: 97.5 mg/dL (ref >39.00)
LDL Cholesterol: 104 mg/dL — ABNORMAL HIGH (ref 0–99)
NonHDL: 124.02
Total CHOL/HDL Ratio: 2
Triglycerides: 100 mg/dL (ref 0.0–149.0)
VLDL: 20 mg/dL (ref 0.0–40.0)

## 2018-09-13 LAB — VITAMIN D 25 HYDROXY (VIT D DEFICIENCY, FRACTURES): VITD: 31.9 ng/mL (ref 30.00–100.00)

## 2018-09-13 LAB — HEMOGLOBIN A1C: Hgb A1c MFr Bld: 6 % (ref 4.6–6.5)

## 2018-09-15 ENCOUNTER — Other Ambulatory Visit: Payer: Self-pay | Admitting: Endocrinology

## 2018-09-16 ENCOUNTER — Other Ambulatory Visit: Payer: Self-pay | Admitting: Endocrinology

## 2018-09-16 NOTE — Progress Notes (Signed)
Patient ID: Alexandria Petersen, female   DOB: [birth year], 78 y.o.   MRN: [id number]                                                                                                               Reason for Appointment: Hypothyroidism, follow up     History of Present Illness:   Prior history: When she was first seen in 12/16 she had symptoms of weight loss,  palpitations, feeling tired, occasionally feeling warm and some weakness in her legs. Also was having decreased appetite.  She previously had suppressed TSH levels since at least 10/2013 Pretreatment labs showed increased free T4 and free T3 levels in 12/16  She was also found to have a goiter on exam initially.  Was started on Methimazole 5 mg twice a day in 12/2014   Her methimazole was tapered off and stopped in 09/2015 when her free T4 was low normal However without her methimazole she had a recurrence of her hyperthyroidism in 1/18; she was having symptoms of palpitations and fatigue She finally had her I-131 treatment on 05/02/16 with 18.1 mCi   RECENT history:  She had become hypothyroid in 07/2016 when she was having fatigue, increased weight and hoarseness She has been started on levothyroxine, initially 112 g and then this was reduced gradually  She has been on 75 mcg since 2019 Her TSH was low normal at 0.35 in 7/19 and she was told to be taking 6-1/2 tablets/week  However because of her TSH being 8.1 she was told to go up on her dose She is now taking 1-1/2 tablets on Sundays and 1 tablet on the other days giving her an average of 80 mcg daily  She did not feel any different with the dosage change Does not feel lethargic although sometimes in the daytime she will take a nap No cold intolerance Her weight has been about the same but is appearing higher now  She has been regular with taking her levothyroxine before breakfast daily, uses water for this  Her TSH high at 8.1  Wt Readings from Last 3  Encounters:  09/17/18 158 lb (71.7 kg)  06/13/18 154 lb 12.8 oz (70.2 kg)  03/29/18 154 lb (69.9 kg)   Lab Results  Component Value Date   TSH 8.11 (H) 06/05/2018   TSH 2.10 12/05/2017   TSH 0.35 08/03/2017   FREET4 1.07 06/05/2018   FREET4 1.14 12/05/2017   FREET4 1.31 08/03/2017       Allergies as of 09/17/2018      Reactions   Penicillins    REACTION: hallucinations      Medication List       Accurate as of September 17, 2018  2:59 PM. If you have any questions, ask your nurse or doctor.        acetaminophen 650 MG CR tablet Commonly known as: TYLENOL Take 650 mg by mouth every 8 (eight) hours as needed for pain.   CENTRUM SILVER ADULT 50+ PO Take 1 tablet by mouth  daily.   hydrochlorothiazide 12.5 MG capsule Commonly known as: MICROZIDE TAKE 1 CAPSULE BY MOUTH EVERY DAY   levothyroxine 75 MCG tablet Commonly known as: SYNTHROID Take 1 tablet by mouth every day except Sunday. On Sunday, take 1 1/2 tablets.   losartan 25 MG tablet Commonly known as: COZAAR TAKE 2 TABLETS BY MOUTH ONCE DAILY. (TOTAL OF 50MG  DAILY.)   pravastatin 40 MG tablet Commonly known as: PRAVACHOL TAKE 1 TABLET BY MOUTH EVERY DAY   traMADol 50 MG tablet Commonly known as: ULTRAM Take 50 mg by mouth every 6 (six) hours as needed.           Past Medical History:  Diagnosis Date  . Arthritis   . HLD (hyperlipidemia)   . Hypertension   . Hypothyroidism   . Osteoporosis     Past Surgical History:  Procedure Laterality Date  . BREAST BIOPSY Left   . HEMORROIDECTOMY     ?  . ROTATOR CUFF REPAIR Right   . TUBAL LIGATION      Family History  Problem Relation Age of Onset  . Hypertension Mother   . Diabetes Mother   . Hypertension Father   . Diabetes Sister   . Diabetes Brother   . Hypothyroidism Daughter   . Heart disease Son   . Hypothyroidism Son     Social History:  reports that she has never smoked. She has never used smokeless tobacco. She reports that she  does not drink alcohol or use drugs.  Allergies:  Allergies  Allergen Reactions  . Penicillins     REACTION: hallucinations     Review of Systems  Blood pressure is being treated with losartan and HCTZ  Hypertension has been managed by her PCP although her last prescription of losartan is still in my name She does not have a reliable blood pressure meter at home She has not had any regular follow-ups  Repeat blood pressure was 180/78 at the end of the visit  BP Readings from Last 3 Encounters:  09/17/18 (!) 208/78  06/13/18 140/70  03/29/18 121/62    She has prediabetes Glucose 102 fasting   Examination:   BP (!) 208/78 (BP Location: Left Arm, Patient Position: Sitting, Cuff Size: Normal)   Pulse 76   Ht 5\' 1"  (1.549 m)   Wt 158 lb (71.7 kg)   SpO2 98%   BMI 29.85 kg/m       Assessment/Plan:  Post-ablative hypothyroidism since 7/18  She is taking the equivalent of 80 mcg of levothyroxine now Her TSH was higher on her last visit and she is taking 7-1/2 tablets a week of the levothyroxine She is stating that she is taking it every day as directed before breakfast Her weight is gone up slightly Difficult to assess her symptoms as these are nonspecific  Labs will be rechecked today as they were not drawn on Friday with PCP lab  HYPERTENSION: Blood pressure is unusually high She needs to follow-up with PCP and she will call  Not clear if she is consistent with her medication although she thinks she is taking it regularly  There are no Patient Instructions on file for this visit.    Influenza vaccine given  Elayne Snare 09/17/2018, 2:59 PM   Note: This office note was prepared with Dragon voice recognition system technology. Any transcriptional errors that result from this process are unintentional.

## 2018-09-17 ENCOUNTER — Ambulatory Visit (INDEPENDENT_AMBULATORY_CARE_PROVIDER_SITE_OTHER): Payer: Medicare Other | Admitting: Endocrinology

## 2018-09-17 ENCOUNTER — Encounter: Payer: Self-pay | Admitting: Endocrinology

## 2018-09-17 ENCOUNTER — Telehealth: Payer: Self-pay | Admitting: *Deleted

## 2018-09-17 ENCOUNTER — Encounter: Payer: Self-pay | Admitting: *Deleted

## 2018-09-17 ENCOUNTER — Other Ambulatory Visit: Payer: Self-pay

## 2018-09-17 VITALS — BP 208/78 | HR 76 | Ht 61.0 in | Wt 158.0 lb

## 2018-09-17 DIAGNOSIS — Z23 Encounter for immunization: Secondary | ICD-10-CM | POA: Diagnosis not present

## 2018-09-17 DIAGNOSIS — I10 Essential (primary) hypertension: Secondary | ICD-10-CM

## 2018-09-17 DIAGNOSIS — E890 Postprocedural hypothyroidism: Secondary | ICD-10-CM | POA: Diagnosis not present

## 2018-09-17 LAB — T4, FREE: Free T4: 1.14 ng/dL (ref 0.60–1.60)

## 2018-09-17 LAB — TSH: TSH: 1.38 u[IU]/mL (ref 0.35–4.50)

## 2018-09-17 MED ORDER — LOSARTAN POTASSIUM 100 MG PO TABS: 100.0000 mg | ORAL_TABLET | Freq: Every day | ORAL | 11 refills | Status: DC

## 2018-09-17 NOTE — Telephone Encounter (Signed)
Patient called stating that she went to see her endocrinologist today and her blood pressure was 208/78. Patient stated that the doctor checked it later, but not sure what he got. The reading in the chart the second time shows 180/78. Patient stated that she does not have any symptoms and feels fine. Patient checked her blood pressure with her machine while on the phone and she got 179/62 left arm, 177/62 right arm, heart rate 109. Patient stated that she had taken her blood pressure medication about 3 hours prior to her appointment today. Patient stated that she has not missed any of her medications. Pharmacy [facility] [address]

## 2018-09-17 NOTE — Telephone Encounter (Signed)
Patient notified as instructed by telephone and verbalized understanding. Patient stated that she feels just fine, but a little nervous because her blood pressure was up earlier. Follow-up appointment scheduled 10/01/18. Patient advised to continue to monitor her blood pressure and if it stays elevated or drastically drops with the medication change to let Dr. Diona Browner know. Advised patient to bring her blood pressure readings with her for her appointment. ER precautions given to patient and she verbalized understanding.

## 2018-09-17 NOTE — Telephone Encounter (Signed)
BP Readings from Last 3 Encounters:  09/17/18 (!) 208/78  06/13/18 140/70  03/29/18 121/62    Have her increase  Losartan to 100 mg daily... sent in rx. Can use up what she has by taking 4 x 25 mg tabs daily.   Follow BP at home and make follow up HTN check in office in 2 weeks.

## 2018-09-17 NOTE — Progress Notes (Signed)
Please call to let patient know that the lab results are normal.  She can stay on the same thyroid dose and follow-up in 6 months Please remind her to see her PCP as blood pressure is significantly high

## 2018-10-01 ENCOUNTER — Ambulatory Visit (INDEPENDENT_AMBULATORY_CARE_PROVIDER_SITE_OTHER): Payer: Medicare Other | Admitting: Family Medicine

## 2018-10-01 ENCOUNTER — Encounter: Payer: Self-pay | Admitting: Family Medicine

## 2018-10-01 ENCOUNTER — Other Ambulatory Visit: Payer: Self-pay

## 2018-10-01 VITALS — BP 158/70 | HR 98 | Temp 97.3°F | Ht 62.75 in | Wt 158.0 lb

## 2018-10-01 DIAGNOSIS — I10 Essential (primary) hypertension: Secondary | ICD-10-CM

## 2018-10-01 LAB — BASIC METABOLIC PANEL
BUN: 12 mg/dL (ref 6–23)
CO2: 31 mEq/L (ref 19–32)
Calcium: 10.1 mg/dL (ref 8.4–10.5)
Chloride: 100 mEq/L (ref 96–112)
Creatinine, Ser: 0.89 mg/dL (ref 0.40–1.20)
GFR: 73.87 mL/min (ref >60.00)
Glucose, Bld: 117 mg/dL — ABNORMAL HIGH (ref 70–99)
Potassium: 3.8 mEq/L (ref 3.5–5.1)
Sodium: 139 mEq/L (ref 135–145)

## 2018-10-01 NOTE — Assessment & Plan Note (Signed)
Improved control on higher dose of losartan.  Re-eval BMET on higher dose.  Likely cause  of increase... stress, some anxiety and salt in diet.  Work on stress reduction relaxation, denies need for counselor or med for anxiety.  heart healthy diet.

## 2018-10-01 NOTE — Patient Instructions (Addendum)
Continue losartan 100 mg daily as well as HCTZ.  Work on low salt diet.  Get back to walking some.  Please stop at the lab to have labs drawn.

## 2018-10-01 NOTE — Progress Notes (Signed)
Chief Complaint  Patient presents with  . Follow-up    Blood Pressure    History of Present Illness: HPI    78 year old female with HTn presents for follow up BP.  BP has been increased lately.. was noted at 09/17/18 OV with ENDO Dr. Dwyane Dee.  Pt called and her losartan was increase to 100 mg daily. And HCTZ  She reports no SE. Hypertension:   BP Readings from Last 3 Encounters:  10/01/18 (!) 158/70  09/17/18 (!) 208/78  06/13/18 140/70  Using medication without problems or lightheadedness: none Chest pain with exertion:none Edema:none Short of breath:none Average home BPs: At home 126/63-143/59 in AMs Other issues: She lost her brother in law 2022/10/09 and brother passed away 2022/10/11.Marland Kitchen She has been somewhat stressed out from this.  Per ENDO thyroid stable  Lab Results  Component Value Date   TSH 1.38 09/17/2018   9/4 CMET nml with nml kidney function. Wt Readings from Last 3 Encounters:  10/01/18 158 lb (71.7 kg)  09/17/18 158 lb (71.7 kg)  06/13/18 154 lb 12.8 oz (70.2 kg)  No weight gain.  Does drink coffee.  She has been haivng more salty things.. canned goods and deli meats.   COVID 19 screen No recent travel or known exposure to COVID19 The patient denies respiratory symptoms of COVID 19 at this time.  The importance of social distancing was discussed today.   Review of Systems  Constitutional: Negative for chills and fever.  HENT: Negative for congestion and ear pain.   Eyes: Negative for pain and redness.  Respiratory: Negative for cough and shortness of breath.   Cardiovascular: Negative for chest pain, palpitations and leg swelling.  Gastrointestinal: Negative for abdominal pain, blood in stool, constipation, diarrhea, nausea and vomiting.  Genitourinary: Negative for dysuria.  Musculoskeletal: Negative for falls and myalgias.  Skin: Negative for rash.  Neurological: Negative for dizziness.  Psychiatric/Behavioral: Negative for depression. The patient is not  nervous/anxious.       Past Medical History:  Diagnosis Date  . Arthritis   . HLD (hyperlipidemia)   . Hypertension   . Hypothyroidism   . Osteoporosis     reports that she has never smoked. She has never used smokeless tobacco. She reports that she does not drink alcohol or use drugs.   Current Outpatient Medications:  .  acetaminophen (TYLENOL) 650 MG CR tablet, Take 650 mg by mouth every 8 (eight) hours as needed for pain., Disp: , Rfl:  .  hydrochlorothiazide (MICROZIDE) 12.5 MG capsule, TAKE 1 CAPSULE BY MOUTH EVERY DAY, Disp: 90 capsule, Rfl: 1 .  levothyroxine (SYNTHROID) 75 MCG tablet, Take 1 tablet by mouth every day except Sunday. On Sunday, take 1 1/2 tablets., Disp: 96 tablet, Rfl: 0 .  losartan (COZAAR) 100 MG tablet, Take 1 tablet (100 mg total) by mouth daily., Disp: 30 tablet, Rfl: 11 .  Multiple Vitamins-Minerals (CENTRUM SILVER ADULT 50+ PO), Take 1 tablet by mouth daily., Disp: , Rfl:  .  pravastatin (PRAVACHOL) 40 MG tablet, TAKE 1 TABLET BY MOUTH EVERY DAY, Disp: 90 tablet, Rfl: 3 .  traMADol (ULTRAM) 50 MG tablet, Take 50 mg by mouth every 6 (six) hours as needed., Disp: , Rfl:    Observations/Objective: Blood pressure (!) 158/70, pulse 98, temperature (!) 97.3 F (36.3 C), temperature source Temporal, height 5' 2.75" (1.594 m), weight 158 lb (71.7 kg), SpO2 99 %.  Physical Exam Constitutional:      General: She is not in acute  distress.    Appearance: Normal appearance. She is well-developed. She is not ill-appearing or toxic-appearing.  HENT:     Head: Normocephalic.     Right Ear: Hearing, tympanic membrane, ear canal and external ear normal. Tympanic membrane is not erythematous, retracted or bulging.     Left Ear: Hearing, tympanic membrane, ear canal and external ear normal. Tympanic membrane is not erythematous, retracted or bulging.     Nose: No mucosal edema or rhinorrhea.     Right Sinus: No maxillary sinus tenderness or frontal sinus tenderness.      Left Sinus: No maxillary sinus tenderness or frontal sinus tenderness.     Mouth/Throat:     Pharynx: Uvula midline.  Eyes:     General: Lids are normal. Lids are everted, no foreign bodies appreciated.     Conjunctiva/sclera: Conjunctivae normal.     Pupils: Pupils are equal, round, and reactive to light.  Neck:     Musculoskeletal: Normal range of motion and neck supple.     Thyroid: No thyroid mass or thyromegaly.     Vascular: No carotid bruit.     Trachea: Trachea normal.  Cardiovascular:     Rate and Rhythm: Normal rate and regular rhythm.     Pulses: Normal pulses.     Heart sounds: Normal heart sounds, S1 normal and S2 normal. No murmur. No friction rub. No gallop.   Pulmonary:     Effort: Pulmonary effort is normal. No tachypnea or respiratory distress.     Breath sounds: Normal breath sounds. No decreased breath sounds, wheezing, rhonchi or rales.  Abdominal:     General: Bowel sounds are normal.     Palpations: Abdomen is soft.     Tenderness: There is no abdominal tenderness.  Skin:    General: Skin is warm and dry.     Findings: No rash.  Neurological:     Mental Status: She is alert.  Psychiatric:        Mood and Affect: Mood is not anxious or depressed.        Speech: Speech normal.        Behavior: Behavior normal. Behavior is cooperative.        Thought Content: Thought content normal.        Judgment: Judgment normal.      Assessment and Plan   HYPERTENSION, BENIGN ESSENTIAL Improved control on higher dose of losartan.  Re-eval BMET on higher dose.  Likely cause  of increase... stress, some anxiety and salt in diet.  Work on stress reduction relaxation, denies need for counselor or med for anxiety.  heart healthy diet.      Eliezer Lofts, MD

## 2018-10-02 ENCOUNTER — Encounter: Payer: Self-pay | Admitting: *Deleted

## 2018-12-03 ENCOUNTER — Other Ambulatory Visit: Payer: Self-pay | Admitting: Endocrinology

## 2018-12-03 NOTE — Telephone Encounter (Signed)
Per the request of Dr. Dwyane Dee, this medication is being forwarded to PCP for management.

## 2018-12-03 NOTE — Telephone Encounter (Signed)
Would you like to refill Losartan or refer to PCP?

## 2018-12-03 NOTE — Telephone Encounter (Signed)
Forward to PCP.

## 2018-12-17 ENCOUNTER — Ambulatory Visit: Payer: Medicare Other | Admitting: Endocrinology

## 2019-01-24 ENCOUNTER — Other Ambulatory Visit: Payer: Self-pay | Admitting: Family Medicine

## 2019-01-24 DIAGNOSIS — Z1231 Encounter for screening mammogram for malignant neoplasm of breast: Secondary | ICD-10-CM

## 2019-01-29 ENCOUNTER — Telehealth: Payer: Self-pay

## 2019-01-29 LAB — HEMOGLOBIN A1C: Hemoglobin A1C: 5.8

## 2019-01-29 NOTE — Telephone Encounter (Signed)
Arville Lime NP with [employer] left v/m for pts age 81-80 with BMI >25 automatically pt gets A1C. Today pts A1C was 5.8; no cb necessary. FYI to Dr Diona Browner.

## 2019-01-30 NOTE — Telephone Encounter (Signed)
A1c abstracted as instructed by Dr. Diona Browner.

## 2019-01-30 NOTE — Telephone Encounter (Signed)
Please abstract result if able

## 2019-01-31 ENCOUNTER — Telehealth: Payer: Self-pay | Admitting: Family Medicine

## 2019-01-31 DIAGNOSIS — E7800 Pure hypercholesterolemia, unspecified: Secondary | ICD-10-CM

## 2019-01-31 DIAGNOSIS — R7303 Prediabetes: Secondary | ICD-10-CM

## 2019-01-31 NOTE — Telephone Encounter (Signed)
-----   Message from [name] sent at 01/24/2019  3:57 PM EST ----- Regarding: Lab orders for Monday, 1.25.21 Patient is scheduled for CPX labs, please order future labs, Thanks , Karna Christmas

## 2019-02-03 ENCOUNTER — Ambulatory Visit (INDEPENDENT_AMBULATORY_CARE_PROVIDER_SITE_OTHER): Payer: Medicare Other

## 2019-02-03 ENCOUNTER — Other Ambulatory Visit (INDEPENDENT_AMBULATORY_CARE_PROVIDER_SITE_OTHER): Payer: Medicare Other

## 2019-02-03 ENCOUNTER — Other Ambulatory Visit: Payer: Self-pay

## 2019-02-03 ENCOUNTER — Ambulatory Visit: Payer: Medicare Other

## 2019-02-03 VITALS — BP 119/61 | HR 73 | Wt 154.0 lb

## 2019-02-03 DIAGNOSIS — Z0000 Encounter for general adult medical examination without abnormal findings: Secondary | ICD-10-CM

## 2019-02-03 DIAGNOSIS — E7800 Pure hypercholesterolemia, unspecified: Secondary | ICD-10-CM | POA: Diagnosis not present

## 2019-02-03 LAB — COMPREHENSIVE METABOLIC PANEL
ALT: 16 U/L (ref 0–35)
AST: 19 U/L (ref 0–37)
Albumin: 4.4 g/dL (ref 3.5–5.2)
Alkaline Phosphatase: 46 U/L (ref 39–117)
BUN: 15 mg/dL (ref 6–23)
CO2: 30 mEq/L (ref 19–32)
Calcium: 10.2 mg/dL (ref 8.4–10.5)
Chloride: 102 mEq/L (ref 96–112)
Creatinine, Ser: 0.96 mg/dL (ref 0.40–1.20)
GFR: 67.63 mL/min (ref >60.00)
Glucose, Bld: 113 mg/dL — ABNORMAL HIGH (ref 70–99)
Potassium: 3.3 mEq/L — ABNORMAL LOW (ref 3.5–5.1)
Sodium: 140 mEq/L (ref 135–145)
Total Bilirubin: 1.3 mg/dL — ABNORMAL HIGH (ref 0.2–1.2)
Total Protein: 7.5 g/dL (ref 6.0–8.3)

## 2019-02-03 LAB — LIPID PANEL
Cholesterol: 235 mg/dL — ABNORMAL HIGH (ref 0–200)
HDL: 104 mg/dL (ref >39.00)
LDL Cholesterol: 106 mg/dL — ABNORMAL HIGH (ref 0–99)
NonHDL: 130.59
Total CHOL/HDL Ratio: 2
Triglycerides: 124 mg/dL (ref 0.0–149.0)
VLDL: 24.8 mg/dL (ref 0.0–40.0)

## 2019-02-03 NOTE — Progress Notes (Signed)
Subjective:   Alexandria Petersen is a 79 y.o. female who presents for Medicare Annual (Subsequent) preventive examination.  Review of Systems: N/A   This visit is being conducted through telemedicine via telephone at the nurse health advisor's home address due to the COVID-19 pandemic. This patient has given me verbal consent via doximity to conduct this visit, patient states they are participating from their home address. Patient and myself are on the telephone call. There is no referral for this visit. Some vital signs may be absent or patient reported.    Patient identification: identified by name, DOB, and current address   Cardiac Risk Factors include: advanced age (>43men, >86 women);hypertension;Other (see comment), Risk factor comments: hypercholesterolemia     Objective:     Vitals: BP 119/61   Pulse 73   Wt 154 lb (69.9 kg)   BMI 27.50 kg/m   Body mass index is 27.5 kg/m.  Advanced Directives 02/03/2019 01/28/2018 12/25/2016 12/21/2015  Does Patient Have a Medical Advance Directive? No No No No  Would patient like information on creating a medical advance directive? Yes (MAU/Ambulatory/Procedural Areas - Information given) No - Patient declined No - Patient declined -    Tobacco Social History   Tobacco Use  Smoking Status Never Smoker  Smokeless Tobacco Never Used     Counseling given: Not Answered   Clinical Intake:  Pre-visit preparation completed: Yes  Pain : No/denies pain     Nutritional Risks: None Diabetes: No  How often do you need to have someone help you when you read instructions, pamphlets, or other written materials from your doctor or pharmacy?: 1 - Never What is the last grade level you completed in school?: 12th  Interpreter Needed?: No  Information entered by :: CJohnson, LPN  Past Medical History:  Diagnosis Date  . Arthritis   . HLD (hyperlipidemia)   . Hypertension   . Hypothyroidism   . Osteoporosis    Past  Surgical History:  Procedure Laterality Date  . BREAST BIOPSY Left   . HEMORROIDECTOMY     ?  . ROTATOR CUFF REPAIR Right   . TUBAL LIGATION     Family History  Problem Relation Age of Onset  . Hypertension Mother   . Diabetes Mother   . Hypertension Father   . Diabetes Sister   . Diabetes Brother   . Hypothyroidism Daughter   . Heart disease Son   . Hypothyroidism Son    Social History   Socioeconomic History  . Marital status: Widowed    Spouse name: Not on file  . Number of children: 3  . Years of education: Not on file  . Highest education level: Not on file  Occupational History  . Occupation: retired day Counselling psychologist: RETIRED  Tobacco Use  . Smoking status: Never Smoker  . Smokeless tobacco: Never Used  Substance and Sexual Activity  . Alcohol use: No  . Drug use: No  . Sexual activity: Never  Other Topics Concern  . Not on file  Social History Narrative   Regular exercise--minimal   She is working on setting up end of life planning.   No living will. Full code (reviewed 2013)               Social Determinants of Health   Financial Resource Strain: Low Risk   . Difficulty of Paying Living Expenses: Not hard at all  Food Insecurity: No Food Insecurity  . Worried About Running  Out of Food in the Last Year: Never true  . Ran Out of Food in the Last Year: Never true  Transportation Needs: No Transportation Needs  . Lack of Transportation (Medical): No  . Lack of Transportation (Non-Medical): No  Physical Activity: Inactive  . Days of Exercise per Week: 0 days  . Minutes of Exercise per Session: 0 min  Stress: No Stress Concern Present  . Feeling of Stress : Only a little  Social Connections:   . Frequency of Communication with Friends and Family: Not on file  . Frequency of Social Gatherings with Friends and Family: Not on file  . Attends Religious Services: Not on file  . Active Member of Clubs or Organizations: Not on file  . Attends  Archivist Meetings: Not on file  . Marital Status: Not on file    Outpatient Encounter Medications as of 02/03/2019  Medication Sig  . acetaminophen (TYLENOL) 650 MG CR tablet Take 650 mg by mouth every 8 (eight) hours as needed for pain.  . hydrochlorothiazide (MICROZIDE) 12.5 MG capsule TAKE 1 CAPSULE BY MOUTH EVERY DAY  . levothyroxine (SYNTHROID) 75 MCG tablet TAKE 1 TABLET BY MOUTH EVERY DAY EXCEPT ON SUNDAY ONLY TAKE 1&1/2 TABS  . losartan (COZAAR) 100 MG tablet Take 1 tablet (100 mg total) by mouth daily.  . Multiple Vitamins-Minerals (CENTRUM SILVER ADULT 50+ PO) Take 1 tablet by mouth daily.  . pravastatin (PRAVACHOL) 40 MG tablet TAKE 1 TABLET BY MOUTH EVERY DAY  . traMADol (ULTRAM) 50 MG tablet Take 50 mg by mouth every 6 (six) hours as needed.   No facility-administered encounter medications on file as of 02/03/2019.    Activities of Daily Living In your present state of health, do you have any difficulty performing the following activities: 02/03/2019  Hearing? N  Vision? N  Difficulty concentrating or making decisions? N  Walking or climbing stairs? N  Dressing or bathing? N  Doing errands, shopping? N  Preparing Food and eating ? N  Using the Toilet? N  In the past six months, have you accidently leaked urine? N  Do you have problems with loss of bowel control? N  Managing your Medications? N  Managing your Finances? N  Housekeeping or managing your Housekeeping? N  Some recent data might be hidden    Patient Care Team: Jinny Sanders, MD as PCP - General    Assessment:   This is a routine wellness examination for Alexandria Petersen.  Exercise Activities and Dietary recommendations Current Exercise Habits: The patient does not participate in regular exercise at present, Exercise limited by: None identified  Goals    . Patient Stated     Starting 01/28/18, I will continue to take medications as prescribed.     . Patient Stated     02/03/2019,  I will  maintain and continue medications as prescribed.        Fall Risk Fall Risk  02/03/2019 01/28/2018 12/25/2016 12/21/2015 12/10/2014  Falls in the past year? 0 0 No No No  Number falls in past yr: 0 - - - -  Injury with Fall? 0 - - - -  Risk for fall due to : Medication side effect - - - -  Follow up Falls evaluation completed;Falls prevention discussed - - - -   Is the patient's home free of loose throw rugs in walkways, pet beds, electrical cords, etc?   yes      Grab bars in the bathroom? yes  Handrails on the stairs?   yes      Adequate lighting?   yes  Timed Get Up and Go performed: N/A  Depression Screen PHQ 2/9 Scores 02/03/2019 01/28/2018 12/25/2016 12/21/2015  PHQ - 2 Score 0 0 0 0  PHQ- 9 Score 0 0 1 -     Cognitive Function MMSE - Mini Mental State Exam 02/03/2019 01/28/2018 12/25/2016  Orientation to time 5 5 5   Orientation to Place 5 5 5   Registration 3 3 3   Attention/ Calculation 5 0 0  Recall 2 1 1   Recall-comments - unable to recall 2 of 3 words unable to recall 2 of 3 words  Language- name 2 objects - 0 0  Language- repeat 1 1 1   Language- follow 3 step command - 3 3  Language- read & follow direction - 0 0  Write a sentence - 0 0  Copy design - 0 0  Total score - 18 18  Mini Cog  Mini-Cog screen was completed. Maximum score is 22. A value of 0 denotes this part of the MMSE was not completed or the patient failed this part of the Mini-Cog screening.       Immunization History  Administered Date(s) Administered  . Fluad Quad(high Dose 65+) 09/17/2018  . Influenza Whole 10/16/2008, 10/06/2009  . Influenza, High Dose Seasonal PF 10/28/2015, 10/26/2016  . Influenza,inj,Quad PF,6+ Mos 11/05/2012, 10/30/2013, 10/30/2014, 10/25/2017  . PPD Test 04/21/2014  . Pneumococcal Conjugate-13 12/10/2014  . Pneumococcal Polysaccharide-23 10/26/2009  . Td 08/29/2005  . Tdap 04/05/2010  . Zoster 11/09/2009    Qualifies for Shingles Vaccine? yes  Screening  Tests Health Maintenance  Topic Date Due  . MAMMOGRAM  02/27/2019  . TETANUS/TDAP  04/04/2020  . INFLUENZA VACCINE  Completed  . DEXA SCAN  Completed  . PNA vac Low Risk Adult  Completed    Cancer Screenings: Lung: Low Dose CT Chest recommended if Age 82-80 years, 30 pack-year currently smoking OR have quit w/in 15 years. Patient does not qualify. Breast:  Up to date on Mammogram? Yes, scheduled 03/05/2019   Up to date of Bone Density/Dexa? Yes, completed 02/23/2017 Colorectal: completed 03/29/2018  Additional Screenings:  Hepatitis C Screening: N/A     Plan:    Patient will maintain and continue medications as prescribed.    I have personally reviewed and noted the following in the patient's chart:   . Medical and social history . Use of alcohol, tobacco or illicit drugs  . Current medications and supplements . Functional ability and status . Nutritional status . Physical activity . Advanced directives . List of other physicians . Hospitalizations, surgeries, and ER visits in previous 12 months . Vitals . Screenings to include cognitive, depression, and falls . Referrals and appointments  In addition, I have reviewed and discussed with patient certain preventive protocols, quality metrics, and best practice recommendations. A written personalized care plan for preventive services as well as general preventive health recommendations were provided to patient.     Andrez Grime, LPN  [id number]

## 2019-02-03 NOTE — Patient Instructions (Signed)
Alexandria Petersen , Thank you for taking time to come for your Medicare Wellness Visit. I appreciate your ongoing commitment to your health goals. Please review the following plan we discussed and let me know if I can assist you in the future.   Screening recommendations/referrals: Colonoscopy: Up to date, completed 03/29/2018 Mammogram: scheduled 03/05/2019 Bone Density: Up to date, completed 02/23/2017 Recommended yearly ophthalmology/optometry visit for glaucoma screening and checkup Recommended yearly dental visit for hygiene and checkup  Vaccinations: Influenza vaccine: Up to date, completed 09/17/2018 Pneumococcal vaccine: Completed series Tdap vaccine: Up to date, completed 04/05/2010 Shingles vaccine: discussed    Advanced directives: Advance directive discussed with you today. I have provided a copy for you to complete at home and have notarized. Once this is complete please bring a copy in to our office so we can scan it into your chart.  Conditions/risks identified: hypertension, hypercholesterolemia  Next appointment: 02/07/2019 @ 2:40 pm    Preventive Care 81 Years and Older, Female Preventive care refers to lifestyle choices and visits with your health care provider that can promote health and wellness. What does preventive care include?  A yearly physical exam. This is also called an annual well check.  Dental exams once or twice a year.  Routine eye exams. Ask your health care provider how often you should have your eyes checked.  Personal lifestyle choices, including:  Daily care of your teeth and gums.  Regular physical activity.  Eating a healthy diet.  Avoiding tobacco and drug use.  Limiting alcohol use.  Practicing safe sex.  Taking low-dose aspirin every day.  Taking vitamin and mineral supplements as recommended by your health care provider. What happens during an annual well check? The services and screenings done by your health care provider during your  annual well check will depend on your age, overall health, lifestyle risk factors, and family history of disease. Counseling  Your health care provider may ask you questions about your:  Alcohol use.  Tobacco use.  Drug use.  Emotional well-being.  Home and relationship well-being.  Sexual activity.  Eating habits.  History of falls.  Memory and ability to understand (cognition).  Work and work Statistician.  Reproductive health. Screening  You may have the following tests or measurements:  Height, weight, and BMI.  Blood pressure.  Lipid and cholesterol levels. These may be checked every 5 years, or more frequently if you are over 55 years old.  Skin check.  Lung cancer screening. You may have this screening every year starting at age 53 if you have a 30-pack-year history of smoking and currently smoke or have quit within the past 15 years.  Fecal occult blood test (FOBT) of the stool. You may have this test every year starting at age 66.  Flexible sigmoidoscopy or colonoscopy. You may have a sigmoidoscopy every 5 years or a colonoscopy every 10 years starting at age 31.  Hepatitis C blood test.  Hepatitis B blood test.  Sexually transmitted disease (STD) testing.  Diabetes screening. This is done by checking your blood sugar (glucose) after you have not eaten for a while (fasting). You may have this done every 1-3 years.  Bone density scan. This is done to screen for osteoporosis. You may have this done starting at age 28.  Mammogram. This may be done every 1-2 years. Talk to your health care provider about how often you should have regular mammograms. Talk with your health care provider about your test results, treatment options, and  if necessary, the need for more tests. Vaccines  Your health care provider may recommend certain vaccines, such as:  Influenza vaccine. This is recommended every year.  Tetanus, diphtheria, and acellular pertussis (Tdap, Td)  vaccine. You may need a Td booster every 10 years.  Zoster vaccine. You may need this after age 32.  Pneumococcal 13-valent conjugate (PCV13) vaccine. One dose is recommended after age 78.  Pneumococcal polysaccharide (PPSV23) vaccine. One dose is recommended after age 1. Talk to your health care provider about which screenings and vaccines you need and how often you need them. This information is not intended to replace advice given to you by your health care provider. Make sure you discuss any questions you have with your health care provider. Document Released: 01/22/2015 Document Revised: 09/15/2015 Document Reviewed: 10/27/2014 Elsevier Interactive Patient Education  2017 [place] Prevention in the Home Falls can cause injuries. They can happen to people of all ages. There are many things you can do to make your home safe and to help prevent falls. What can I do on the outside of my home?  Regularly fix the edges of walkways and driveways and fix any cracks.  Remove anything that might make you trip as you walk through a door, such as a raised step or threshold.  Trim any bushes or trees on the path to your home.  Use bright outdoor lighting.  Clear any walking paths of anything that might make someone trip, such as rocks or tools.  Regularly check to see if handrails are loose or broken. Make sure that both sides of any steps have handrails.  Any raised decks and porches should have guardrails on the edges.  Have any leaves, snow, or ice cleared regularly.  Use sand or salt on walking paths during winter.  Clean up any spills in your garage right away. This includes oil or grease spills. What can I do in the bathroom?  Use night lights.  Install grab bars by the toilet and in the tub and shower. Do not use towel bars as grab bars.  Use non-skid mats or decals in the tub or shower.  If you need to sit down in the shower, use a plastic, non-slip  stool.  Keep the floor dry. Clean up any water that spills on the floor as soon as it happens.  Remove soap buildup in the tub or shower regularly.  Attach bath mats securely with double-sided non-slip rug tape.  Do not have throw rugs and other things on the floor that can make you trip. What can I do in the bedroom?  Use night lights.  Make sure that you have a light by your bed that is easy to reach.  Do not use any sheets or blankets that are too big for your bed. They should not hang down onto the floor.  Have a firm chair that has side arms. You can use this for support while you get dressed.  Do not have throw rugs and other things on the floor that can make you trip. What can I do in the kitchen?  Clean up any spills right away.  Avoid walking on wet floors.  Keep items that you use a lot in easy-to-reach places.  If you need to reach something above you, use a strong step stool that has a grab bar.  Keep electrical cords out of the way.  Do not use floor polish or wax that makes floors slippery. If you  must use wax, use non-skid floor wax.  Do not have throw rugs and other things on the floor that can make you trip. What can I do with my stairs?  Do not leave any items on the stairs.  Make sure that there are handrails on both sides of the stairs and use them. Fix handrails that are broken or loose. Make sure that handrails are as long as the stairways.  Check any carpeting to make sure that it is firmly attached to the stairs. Fix any carpet that is loose or worn.  Avoid having throw rugs at the top or bottom of the stairs. If you do have throw rugs, attach them to the floor with carpet tape.  Make sure that you have a light switch at the top of the stairs and the bottom of the stairs. If you do not have them, ask someone to add them for you. What else can I do to help prevent falls?  Wear shoes that:  Do not have high heels.  Have rubber bottoms.  Are  comfortable and fit you well.  Are closed at the toe. Do not wear sandals.  If you use a stepladder:  Make sure that it is fully opened. Do not climb a closed stepladder.  Make sure that both sides of the stepladder are locked into place.  Ask someone to hold it for you, if possible.  Clearly mark and make sure that you can see:  Any grab bars or handrails.  First and last steps.  Where the edge of each step is.  Use tools that help you move around (mobility aids) if they are needed. These include:  Canes.  Walkers.  Scooters.  Crutches.  Turn on the lights when you go into a dark area. Replace any light bulbs as soon as they burn out.  Set up your furniture so you have a clear path. Avoid moving your furniture around.  If any of your floors are uneven, fix them.  If there are any pets around you, be aware of where they are.  Review your medicines with your doctor. Some medicines can make you feel dizzy. This can increase your chance of falling. Ask your doctor what other things that you can do to help prevent falls. This information is not intended to replace advice given to you by your health care provider. Make sure you discuss any questions you have with your health care provider. Document Released: 10/22/2008 Document Revised: 06/03/2015 Document Reviewed: 01/30/2014 Elsevier Interactive Patient Education  2017 [employer].

## 2019-02-03 NOTE — Progress Notes (Signed)
PCP notes:  Health Maintenance: No gaps noted   Abnormal Screenings: None   Patient concerns: none   Nurse concerns: none   Next PCP appt.: 02/07/2019 @ 2:40 pm

## 2019-02-03 NOTE — Progress Notes (Signed)
No critical labs need to be addressed urgently. We will discuss labs in detail at upcoming office visit.   

## 2019-02-07 ENCOUNTER — Other Ambulatory Visit: Payer: Self-pay

## 2019-02-07 ENCOUNTER — Ambulatory Visit (INDEPENDENT_AMBULATORY_CARE_PROVIDER_SITE_OTHER): Payer: Medicare Other | Admitting: Family Medicine

## 2019-02-07 ENCOUNTER — Encounter: Payer: Self-pay | Admitting: Family Medicine

## 2019-02-07 VITALS — BP 148/60 | HR 110 | Temp 98.1°F | Ht 62.0 in | Wt 158.2 lb

## 2019-02-07 DIAGNOSIS — E890 Postprocedural hypothyroidism: Secondary | ICD-10-CM | POA: Diagnosis not present

## 2019-02-07 DIAGNOSIS — Z0000 Encounter for general adult medical examination without abnormal findings: Secondary | ICD-10-CM

## 2019-02-07 DIAGNOSIS — E7800 Pure hypercholesterolemia, unspecified: Secondary | ICD-10-CM | POA: Diagnosis not present

## 2019-02-07 DIAGNOSIS — I10 Essential (primary) hypertension: Secondary | ICD-10-CM | POA: Diagnosis not present

## 2019-02-07 DIAGNOSIS — R7303 Prediabetes: Secondary | ICD-10-CM

## 2019-02-07 NOTE — Progress Notes (Signed)
Chief Complaint  Patient presents with  . Annual Exam    Part 2    History of Present Illness: HPI   The patient presents for  complete physical and review of chronic health problems. He/She also has the following acute concerns today: none  The patient saw a LPN or RN for medicare wellness visit.  Prevention and wellness was reviewed in detail. Note reviewed and important notes copied below. Health Maintenance: No gaps noted Abnormal Screenings: None  Hypertension:   Not at goal in office today on HCTZ and losartan 100 mg . She reports she feels anxious when she is at doctor. She worries more about coming in with pandemic. BP Readings from Last 3 Encounters:  02/07/19 (!) 160/60  02/03/19 119/61  10/01/18 (!) 158/70  Using medication without problems or lightheadedness:  none Chest pain with exertion:none Edema:none Short of breath:none Average home BPs: 110-130/60-70 Other issues:  Elevated Cholesterol:  LDL almost at goal on pravastatin 40 mg daily Lab Results  Component Value Date   CHOL 235 (H) 02/03/2019   HDL 104.00 02/03/2019   LDLCALC 106 (H) 02/03/2019   LDLDIRECT 90.7 10/29/2012   TRIG 124.0 02/03/2019   CHOLHDL 2 02/03/2019  Using medications without problems: none Muscle aches: none Diet compliance: good Exercise: walking some, using cane Other complaints:  Prediabetes  Lab Results  Component Value Date   HGBA1C 5.8 01/29/2019   Hypothyroid:   Good control on levothyroxine 75 mcg Lab Results  Component Value Date   TSH 1.38 09/17/2018       This visit occurred during the SARS-CoV-2 public health emergency.  Safety protocols were in place, including screening questions prior to the visit, additional usage of staff PPE, and extensive cleaning of exam room while observing appropriate contact time as indicated for disinfecting solutions.   COVID 19 screen:  No recent travel or known exposure to COVID19 The patient denies respiratory symptoms  of COVID 19 at this time. The importance of social distancing was discussed today.     Review of Systems  Constitutional: Negative for chills and fever.  HENT: Negative for congestion and ear pain.   Eyes: Negative for pain and redness.  Respiratory: Negative for cough and shortness of breath.   Cardiovascular: Negative for chest pain, palpitations and leg swelling.  Gastrointestinal: Negative for abdominal pain, blood in stool, constipation, diarrhea, nausea and vomiting.  Genitourinary: Negative for dysuria.  Musculoskeletal: Negative for falls and myalgias.  Skin: Negative for rash.  Neurological: Negative for dizziness.  Psychiatric/Behavioral: Negative for depression. The patient is not nervous/anxious.       Past Medical History:  Diagnosis Date  . Arthritis   . HLD (hyperlipidemia)   . Hypertension   . Hypothyroidism   . Osteoporosis     reports that she has never smoked. She has never used smokeless tobacco. She reports that she does not drink alcohol or use drugs.   Current Outpatient Medications:  .  acetaminophen (TYLENOL) 650 MG CR tablet, Take 650 mg by mouth every 8 (eight) hours as needed for pain., Disp: , Rfl:  .  hydrochlorothiazide (MICROZIDE) 12.5 MG capsule, TAKE 1 CAPSULE BY MOUTH EVERY DAY, Disp: 90 capsule, Rfl: 1 .  levothyroxine (SYNTHROID) 75 MCG tablet, TAKE 1 TABLET BY MOUTH EVERY DAY EXCEPT ON SUNDAY ONLY TAKE 1&1/2 TABS, Disp: 96 tablet, Rfl: 0 .  losartan (COZAAR) 100 MG tablet, Take 1 tablet (100 mg total) by mouth daily., Disp: 30 tablet, Rfl: 11 .  Multiple Vitamins-Minerals (CENTRUM SILVER ADULT 50+ PO), Take 1 tablet by mouth daily., Disp: , Rfl:  .  pravastatin (PRAVACHOL) 40 MG tablet, TAKE 1 TABLET BY MOUTH EVERY DAY, Disp: 90 tablet, Rfl: 3 .  traMADol (ULTRAM) 50 MG tablet, Take 50 mg by mouth every 6 (six) hours as needed., Disp: , Rfl:    Observations/Objective: Blood pressure (!) 160/60, pulse (!) 110, temperature 98.1 F (36.7 C),  temperature source Temporal, height 5\' 2"  (1.575 m), weight 158 lb 4 oz (71.8 kg), SpO2 98 %.  Physical Exam Constitutional:      General: She is not in acute distress.    Appearance: Normal appearance. She is well-developed. She is not ill-appearing or toxic-appearing.  HENT:     Head: Normocephalic.     Right Ear: Hearing, tympanic membrane, ear canal and external ear normal.     Left Ear: Hearing, tympanic membrane, ear canal and external ear normal.     Nose: Nose normal.  Eyes:     General: Lids are normal. Lids are everted, no foreign bodies appreciated.     Conjunctiva/sclera: Conjunctivae normal.     Pupils: Pupils are equal, round, and reactive to light.  Neck:     Thyroid: No thyroid mass or thyromegaly.     Vascular: No carotid bruit.     Trachea: Trachea normal.  Cardiovascular:     Rate and Rhythm: Normal rate and regular rhythm.     Heart sounds: Normal heart sounds, S1 normal and S2 normal. No murmur. No gallop.   Pulmonary:     Effort: Pulmonary effort is normal. No respiratory distress.     Breath sounds: Normal breath sounds. No wheezing, rhonchi or rales.  Abdominal:     General: Bowel sounds are normal. There is no distension or abdominal bruit.     Palpations: Abdomen is soft. There is no fluid wave or mass.     Tenderness: There is no abdominal tenderness. There is no guarding or rebound.     Hernia: No hernia is present.  Musculoskeletal:     Cervical back: Normal range of motion and neck supple.  Lymphadenopathy:     Cervical: No cervical adenopathy.  Skin:    General: Skin is warm and dry.     Findings: No rash.  Neurological:     Mental Status: She is alert.     Cranial Nerves: No cranial nerve deficit.     Sensory: No sensory deficit.  Psychiatric:        Mood and Affect: Mood is not anxious or depressed.        Speech: Speech normal.        Behavior: Behavior normal. Behavior is cooperative.        Judgment: Judgment normal.      Assessment  and Plan The patient's preventative maintenance and recommended screening tests for an annual wellness exam were reviewed in full today. Brought up to date unless services declined.  Counselled on the importance of diet, exercise, and its role in overall health and mortality. The patient's FH and SH was reviewed, including their home life, tobacco status, and drug and alcohol status.   Vaccines:uptodate Td, flu, recommended COVID vaccine Pap/DVE:No pap indicated Mammo:nml 02/2018, has appt for 02/2019 Bone Density:02/2017 osteopenia Colon:yearly stool cards.. neg 01/2017, no further indicated Smoking Status:nonsmoker   HYPERTENSION, BENIGN ESSENTIAL  Some element of white coat HTN. Follow at home  ( has been well controlled at home)if remaining elevated will need to  adjust medication.  HYPERCHOLESTEROLEMIA, PURE  LDL almost at goal on pravastatin 40 mg daily  Prediabetes  Well controlled.  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

## 2019-02-07 NOTE — Patient Instructions (Addendum)
Work on increasing exercise.  Increase potassium  Containing foods.  Can start Benefiber for fiber.  Can also consider a probiotic lactobacilli ( one name brand  Is Culturelle or Align).   Call to set up COVID 19 vaccine:  [phone]  OR [phone] OPTION 2    Hypokalemia Hypokalemia means that the amount of potassium in the blood is lower than normal. Potassium is a chemical (electrolyte) that helps regulate the amount of fluid in the body. It also stimulates muscle tightening (contraction) and helps nerves work properly. Normally, most of the body's potassium is inside cells, and only a very small amount is in the blood. Because the amount in the blood is so small, minor changes to potassium levels in the blood can be life-threatening. What are the causes? This condition may be caused by:  Antibiotic medicine.  Diarrhea or vomiting. Taking too much of a medicine that helps you have a bowel movement (laxative) can cause diarrhea and lead to hypokalemia.  Chronic kidney disease (CKD).  Medicines that help the body get rid of excess fluid (diuretics).  Eating disorders, such as bulimia.  Low magnesium levels in the body.  Sweating a lot. What are the signs or symptoms? Symptoms of this condition include:  Weakness.  Constipation.  Fatigue.  Muscle cramps.  Mental confusion.  Skipped heartbeats or irregular heartbeat (palpitations).  Tingling or numbness. How is this diagnosed? This condition is diagnosed with a blood test. How is this treated? This condition may be treated by:  Taking potassium supplements by mouth.  Adjusting the medicines that you take.  Eating more foods that contain a lot of potassium. If your potassium level is very low, you may need to get potassium through an IV and be monitored in the hospital. Follow these instructions at home:   Take over-the-counter and prescription medicines only as told by your health care provider. This  includes vitamins and supplements.  Eat a healthy diet. A healthy diet includes fresh fruits and vegetables, whole grains, healthy fats, and lean proteins.  If instructed, eat more foods that contain a lot of potassium. This includes: ? Nuts, such as peanuts and pistachios. ? Seeds, such as sunflower seeds and pumpkin seeds. ? Peas, lentils, and lima beans. ? Whole grain and bran cereals and breads. ? Fresh fruits and vegetables, such as apricots, avocado, bananas, cantaloupe, kiwi, oranges, tomatoes, asparagus, and potatoes. ? Orange juice. ? Tomato juice. ? Red meats. ? Yogurt.  Keep all follow-up visits as told by your health care provider. This is important. Contact a health care provider if you:  Have weakness that gets worse.  Feel your heart pounding or racing.  Vomit.  Have diarrhea.  Have diabetes (diabetes mellitus) and you have trouble keeping your blood sugar (glucose) in your target range. Get help right away if you:  Have chest pain.  Have shortness of breath.  Have vomiting or diarrhea that lasts for more than 2 days.  Faint. Summary  Hypokalemia means that the amount of potassium in the blood is lower than normal.  This condition is diagnosed with a blood test.  Hypokalemia may be treated by taking potassium supplements, adjusting the medicines that you take, or eating more foods that are high in potassium.  If your potassium level is very low, you may need to get potassium through an IV and be monitored in the hospital. This information is not intended to replace advice given to you by your health care provider. Make sure you  discuss any questions you have with your health care provider. Document Revised: 08/08/2017 Document Reviewed: 08/08/2017 Elsevier Patient Education  [place].

## 2019-02-15 ENCOUNTER — Ambulatory Visit: Payer: Medicare Other | Attending: Internal Medicine

## 2019-02-15 DIAGNOSIS — Z23 Encounter for immunization: Secondary | ICD-10-CM | POA: Insufficient documentation

## 2019-02-15 NOTE — Progress Notes (Signed)
   Covid-19 Vaccination Clinic  Name:  Alexandria Petersen    MRN: [id number] DOB: [birth year]  02/15/2019  Alexandria Petersen was observed post Covid-19 immunization for 15 minutes without incidence. She was provided with Vaccine Information Sheet and instruction to access the V-Safe system.   Alexandria Petersen was instructed to call 911 with any severe reactions post vaccine: Marland Kitchen Difficulty breathing  . Swelling of your face and throat  . A fast heartbeat  . A bad rash all over your body  . Dizziness and weakness    Immunizations Administered    Name Date Dose VIS Date Route   Pfizer COVID-19 Vaccine 02/15/2019 11:17 AM 0.3 mL 12/20/2018 Intramuscular   Manufacturer: [place]   Lot: [id number]   [place]: [id number]

## 2019-02-24 ENCOUNTER — Emergency Department (HOSPITAL_COMMUNITY)
Admission: EM | Admit: 2019-02-24 | Discharge: 2019-02-24 | Disposition: A | Discharge status: Home / Self Care | Payer: Medicare Other | Attending: Emergency Medicine | Admitting: Emergency Medicine

## 2019-02-24 ENCOUNTER — Other Ambulatory Visit: Payer: Self-pay

## 2019-02-24 ENCOUNTER — Encounter (HOSPITAL_COMMUNITY): Payer: Self-pay | Admitting: *Deleted

## 2019-02-24 ENCOUNTER — Telehealth: Payer: Self-pay | Admitting: *Deleted

## 2019-02-24 DIAGNOSIS — I10 Essential (primary) hypertension: Secondary | ICD-10-CM | POA: Diagnosis not present

## 2019-02-24 DIAGNOSIS — E038 Other specified hypothyroidism: Secondary | ICD-10-CM | POA: Insufficient documentation

## 2019-02-24 DIAGNOSIS — R000 Tachycardia, unspecified: Secondary | ICD-10-CM | POA: Diagnosis not present

## 2019-02-24 DIAGNOSIS — Z79899 Other long term (current) drug therapy: Secondary | ICD-10-CM | POA: Diagnosis not present

## 2019-02-24 DIAGNOSIS — Z743 Need for continuous supervision: Secondary | ICD-10-CM | POA: Diagnosis not present

## 2019-02-24 LAB — BASIC METABOLIC PANEL
Anion gap: 13 (ref 5–15)
BUN: 12 mg/dL (ref 8–23)
CO2: 24 mmol/L (ref 22–32)
Calcium: 10.2 mg/dL (ref 8.9–10.3)
Chloride: 104 mmol/L (ref 98–111)
Creatinine, Ser: 1.09 mg/dL — ABNORMAL HIGH (ref 0.44–1.00)
GFR calc Af Amer: 56 mL/min — ABNORMAL LOW (ref >60)
GFR calc non Af Amer: 48 mL/min — ABNORMAL LOW (ref >60)
Glucose, Bld: 160 mg/dL — ABNORMAL HIGH (ref 70–99)
Potassium: 3.4 mmol/L — ABNORMAL LOW (ref 3.5–5.1)
Sodium: 141 mmol/L (ref 135–145)

## 2019-02-24 LAB — URINALYSIS, ROUTINE W REFLEX MICROSCOPIC
Bilirubin Urine: NEGATIVE
Glucose, UA: NEGATIVE mg/dL
Hgb urine dipstick: NEGATIVE
Ketones, ur: NEGATIVE mg/dL
Nitrite: NEGATIVE
Protein, ur: NEGATIVE mg/dL
Specific Gravity, Urine: 1.011 (ref 1.005–1.030)
pH: 6 (ref 5.0–8.0)

## 2019-02-24 LAB — CBC WITH DIFFERENTIAL/PLATELET
Abs Immature Granulocytes: 0.03 10*3/uL (ref 0.00–0.07)
Basophils Absolute: 0.1 10*3/uL (ref 0.0–0.1)
Basophils Relative: 1 %
Eosinophils Absolute: 0.1 10*3/uL (ref 0.0–0.5)
Eosinophils Relative: 1 %
HCT: 43.1 % (ref 36.0–46.0)
Hemoglobin: 14.4 g/dL (ref 12.0–15.0)
Immature Granulocytes: 0 %
Lymphocytes Relative: 13 %
Lymphs Abs: 1.5 10*3/uL (ref 0.7–4.0)
MCH: 30.2 pg (ref 26.0–34.0)
MCHC: 33.4 g/dL (ref 30.0–36.0)
MCV: 90.4 fL (ref 80.0–100.0)
Monocytes Absolute: 0.9 10*3/uL (ref 0.1–1.0)
Monocytes Relative: 8 %
Neutro Abs: 8.9 10*3/uL — ABNORMAL HIGH (ref 1.7–7.7)
Neutrophils Relative %: 77 %
Platelets: 263 10*3/uL (ref 150–400)
RBC: 4.77 MIL/uL (ref 3.87–5.11)
RDW: 14 % (ref 11.5–15.5)
WBC: 11.4 10*3/uL — ABNORMAL HIGH (ref 4.0–10.5)
nRBC: 0 % (ref 0.0–0.2)

## 2019-02-24 NOTE — Discharge Instructions (Addendum)
To need to take your blood pressure medication as prescribed and follow-up with your primary care provider. Your blood pressure is 127/72, we did not make any changes to your blood pressure medications today.

## 2019-02-24 NOTE — ED Notes (Signed)
Called daughter and made aware of discharge and instructions

## 2019-02-24 NOTE — ED Provider Notes (Signed)
[facility] EMERGENCY DEPARTMENT Provider Note   CSN: [id number] Arrival date & time: 02/24/19  [id number]     History Chief Complaint  Patient presents with  . Hypertension    Alexandria Petersen is a 79 y.o. female.  78 year old female brought in by EMS from home, past medical history of hyperlipidemia, hypertension, hypothyroidism.  Patient states that her blood pressure was elevated yesterday, she woke up this morning and checked her blood pressure and it was "high" so she called paramedics.  Patient continued to check her blood pressure while awaiting the paramedics and states it "got as high as 200." Patient denies any chest pain, SHOB, abdominal pain, headache, visual changes.         Past Medical History:  Diagnosis Date  . Arthritis   . HLD (hyperlipidemia)   . Hypertension   . Hypothyroidism   . Osteoporosis     Patient Active Problem List   Diagnosis Date Noted  . Left medial knee pain 12/19/2017  . 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 Counseling regarding end of life decision making 12/10/2014  . Osteoarthritis of both knees 11/07/2013  . Allergic rhinitis 11/14/2010  . Prediabetes 02/15/2009  . HYPERCHOLESTEROLEMIA, PURE 08/21/2006  . HYPERTENSION, BENIGN ESSENTIAL 08/21/2006  . Osteopenia 08/21/2006    Past Surgical History:  Procedure Laterality Date  . BREAST BIOPSY Left   . HEMORROIDECTOMY     ?  . ROTATOR CUFF REPAIR Right   . TUBAL LIGATION       OB History   No obstetric history on file.     Family History  Problem Relation Age of Onset  . Hypertension Mother   . Diabetes Mother   . Hypertension Father   . Diabetes Sister   . Diabetes Brother   . Hypothyroidism Daughter   . Heart disease Son   . Hypothyroidism Son     Social History   Tobacco Use  . Smoking status: Never Smoker  . Smokeless tobacco: Never Used  Substance  Use Topics  . Alcohol use: No  . Drug use: No    Home Medications Prior to Admission medications   Medication Sig Start Date End Date Taking? Authorizing Provider  acetaminophen (TYLENOL) 650 MG CR tablet Take 650 mg by mouth every 8 (eight) hours as needed for pain.   Yes [provider]  hydrochlorothiazide (MICROZIDE) 12.5 MG capsule TAKE 1 CAPSULE BY MOUTH EVERY DAY 09/02/18  Yes Bedsole, Amy E, MD  levothyroxine (SYNTHROID) 75 MCG tablet TAKE 1 TABLET BY MOUTH EVERY DAY EXCEPT ON SUNDAY ONLY TAKE 1&1/2 TABS Patient taking differently: Take 75-112.5 mcg by mouth See admin instructions. TAKE 1 TABLET BY MOUTH EVERY DAY EXCEPT ON SUNDAY ONLY TAKE 1&1/2 TABS 12/03/18  Yes Elayne Snare, MD  losartan (COZAAR) 100 MG tablet Take 1 tablet (100 mg total) by mouth daily. 09/17/18  Yes Bedsole, Amy E, MD  Multiple Vitamins-Minerals (CENTRUM SILVER ADULT 50+ PO) Take 1 tablet by mouth daily.   Yes [provider]  pravastatin (PRAVACHOL) 40 MG tablet TAKE 1 TABLET BY MOUTH EVERY DAY 03/20/18  Yes Bedsole, Amy E, MD  traMADol (ULTRAM) 50 MG tablet Take 50 mg by mouth every 6 (six) hours as needed.   Yes [provider]    Allergies    Penicillins  Review of Systems   Review of Systems  Constitutional: Negative for chills and fever.  Respiratory: Negative for shortness  of breath.   Cardiovascular: Negative for chest pain.  Gastrointestinal: Negative for abdominal pain, nausea and vomiting.  Genitourinary: Negative for difficulty urinating.  Skin: Negative for rash and wound.  Allergic/Immunologic: Negative for immunocompromised state.  Neurological: Negative for weakness and headaches.  Psychiatric/Behavioral: Negative for confusion.  All other systems reviewed and are negative.   Physical Exam Updated Vital Signs BP 127/72   Pulse 79   Temp 98.7 F (37.1 C) (Oral)   Resp 18   Ht 5\' 4"  (1.626 m)   Wt 69.9 kg   SpO2 100%   BMI 26.43 kg/m   Physical  Exam Vitals and nursing note reviewed.  Constitutional:      General: She is not in acute distress.    Appearance: She is well-developed. She is not diaphoretic.  HENT:     Head: Normocephalic and atraumatic.     Mouth/Throat:     Mouth: Mucous membranes are moist.     Pharynx: No oropharyngeal exudate or posterior oropharyngeal erythema.  Cardiovascular:     Rate and Rhythm: Normal rate and regular rhythm.     Pulses: Normal pulses.     Heart sounds: Normal heart sounds.  Pulmonary:     Effort: Pulmonary effort is normal.     Breath sounds: Normal breath sounds.  Chest:     Chest wall: No tenderness.  Abdominal:     Palpations: Abdomen is soft.     Tenderness: There is no abdominal tenderness.  Musculoskeletal:     Right lower leg: No edema.     Left lower leg: No edema.  Skin:    General: Skin is warm and dry.  Neurological:     Mental Status: She is alert and oriented to person, place, and time.  Psychiatric:        Behavior: Behavior normal.     ED Results / Procedures / Treatments   Labs (all labs ordered are listed, but only abnormal results are displayed) Labs Reviewed  CBC WITH DIFFERENTIAL/PLATELET - Abnormal; Notable for the following components:      Result Value   WBC 11.4 (*)    Neutro Abs 8.9 (*)    All other components within normal limits  BASIC METABOLIC PANEL - Abnormal; Notable for the following components:   Potassium 3.4 (*)    Glucose, Bld 160 (*)    Creatinine, Ser 1.09 (*)    GFR calc non Af Amer 48 (*)    GFR calc Af Amer 56 (*)    All other components within normal limits  URINALYSIS, ROUTINE W REFLEX MICROSCOPIC - Abnormal; Notable for the following components:   Leukocytes,Ua MODERATE (*)    Bacteria, UA RARE (*)    All other components within normal limits    EKG None  Radiology No results found.  Procedures Procedures (including critical care time)  Medications Ordered in ED Medications - No data to display  ED Course   I have reviewed the triage vital signs and the nursing notes.  Pertinent labs & imaging results that were available during my care of the patient were reviewed by me and considered in my medical decision making (see chart for details).  Clinical Course as of Feb 23 934  Mon Feb 24, 2019  0717 78yo female brought in by EMS for elevated blood pressure without any other complaints. BP "as high as 200" at home, on arrival, BP 153/81. Offered reassurance, will check basic labs and EKG. Discussed with Dr. Leonides Schanz, ER attending  agrees with plan of care. Call to daughter with update, daughter is in the parking lot waiting.    [LM]  [id number] Blood pressure at time of discharge is 127/72, patient was not given any medications while in the emergency room today.  CBC shows a mild leukocytosis with a white count of 11,000, no concerns for infection at this time.  BMP without significant changes from previous, creatinine is 1.09 patient has a very mild hypokalemia with a potassium of 3.4.  Urinalysis is negative for protein, incidental finding of leukocytes with rare bacteria however the sample is contaminated.  Patient does not have any urinary symptoms, will not recommend treatment at this time. Advised patient to follow-up with her primary care provider.   [LM]    Clinical Course User Index [LM] Roque Lias   MDM Rules/Calculators/A&P                      Final Clinical Impression(s) / ED Diagnoses Final diagnoses:  Essential hypertension    Rx / DC Orders ED Discharge Orders    None       Tacy Learn, PA-C 02/24/19 0936    Ward, Delice Bison, DO 02/25/19 [phone]

## 2019-02-24 NOTE — ED Notes (Signed)
Patient called daughter on the phone and spoke with her

## 2019-02-24 NOTE — Telephone Encounter (Signed)
Patient's daughter called stating that her mom's blood pressure was really high and was taken to the ER today by EMS. Patient daughter stated that she does not understanding why her mom's blood pressure has been elevated over the weekend. Patient's daughter stated that her mom got a covid vaccine on 02/15/19 and wondering if that could cause her blood pressure to go up? Marland Kitchen Patient was scheduled for a ER follow-up tomorrow 02/25/19 with Dr. Diona Browner. Patient and her daughters had lots of questions about the ER visit and advised them that Dr. Diona Browner can discuss their questions tomorrow at office visit. Marland Kitchen

## 2019-02-24 NOTE — ED Triage Notes (Signed)
Patient presents to ed via [facility] from home states she takes her blood pressure everyday and yest her pressure was up all day. Denies pain only her for blood pressure.

## 2019-02-24 NOTE — ED Notes (Signed)
[place], please update.

## 2019-02-24 NOTE — ED Notes (Signed)
Called daughter Loletha Carrow update given

## 2019-02-25 ENCOUNTER — Other Ambulatory Visit: Payer: Self-pay

## 2019-02-25 ENCOUNTER — Encounter: Payer: Self-pay | Admitting: Family Medicine

## 2019-02-25 ENCOUNTER — Other Ambulatory Visit: Payer: Self-pay | Admitting: Endocrinology

## 2019-02-25 ENCOUNTER — Ambulatory Visit (INDEPENDENT_AMBULATORY_CARE_PROVIDER_SITE_OTHER): Payer: Medicare Other | Admitting: Family Medicine

## 2019-02-25 VITALS — BP 158/60 | HR 93 | Temp 98.4°F | Ht 62.0 in | Wt 158.0 lb

## 2019-02-25 DIAGNOSIS — I10 Essential (primary) hypertension: Secondary | ICD-10-CM

## 2019-02-25 DIAGNOSIS — E876 Hypokalemia: Secondary | ICD-10-CM | POA: Diagnosis not present

## 2019-02-25 DIAGNOSIS — T50Z95D Adverse effect of other vaccines and biological substances, subsequent encounter: Secondary | ICD-10-CM | POA: Insufficient documentation

## 2019-02-25 MED ORDER — POTASSIUM CHLORIDE CRYS ER 20 MEQ PO TBCR: 20.0000 meq | EXTENDED_RELEASE_TABLET | Freq: Every day | ORAL | 0 refills | Status: DC

## 2019-02-25 NOTE — Progress Notes (Signed)
Chief Complaint  Patient presents with  . Follow-up    ER visit-HTN    History of Present Illness: HPI   79 year old female with history of hypothyroidism and hypertension presents following acute episode of blood pressure elevation. On losartan 100 mg daily and HCTZ 25 mg daily.  BP at home systolic in [id number]... in ER BP 125/79 She remained asymptomatic.   In week prior she noted BP gradually increasing.  COVID19 vaccine 1st vaccine 02/15/2019.. felt  occ palpitations.    Keeping up with water.   No SOB, no CP, no V/D.  No abdominal.   No skin rash, no oral swelling, no allergy symptoms.   In ER:   UA clear  BMET: k 3.4 glucose 160 Cr 1.09 GFR 56.  CBC  Wbc 11.4, Hg 14.4 BP Readings from Last 3 Encounters:  02/25/19 (!) 158/60  02/24/19 125/79  02/07/19 (!) 148/60      This visit occurred during the SARS-CoV-2 public health emergency.  Safety protocols were in place, including screening questions prior to the visit, additional usage of staff PPE, and extensive cleaning of exam room while observing appropriate contact time as indicated for disinfecting solutions.   COVID 19 screen:  No recent travel or known exposure to COVID19 The patient denies respiratory symptoms of COVID 19 at this time. The importance of social distancing was discussed today.     Review of Systems  Constitutional: Negative for chills and fever.  HENT: Negative for congestion and ear pain.   Eyes: Negative for pain and redness.  Respiratory: Negative for cough and shortness of breath.   Cardiovascular: Negative for chest pain, palpitations and leg swelling.  Gastrointestinal: Negative for abdominal pain, blood in stool, constipation, diarrhea, nausea and vomiting.  Genitourinary: Negative for dysuria.  Musculoskeletal: Negative for falls and myalgias.  Skin: Negative for rash.  Neurological: Negative for dizziness.  Psychiatric/Behavioral: Negative for depression. The patient is not  nervous/anxious.       Past Medical History:  Diagnosis Date  . Arthritis   . HLD (hyperlipidemia)   . Hypertension   . Hypothyroidism   . Osteoporosis     reports that she has never smoked. She has never used smokeless tobacco. She reports that she does not drink alcohol or use drugs.   Current Outpatient Medications:  .  acetaminophen (TYLENOL) 650 MG CR tablet, Take 650 mg by mouth every 8 (eight) hours as needed for pain., Disp: , Rfl:  .  hydrochlorothiazide (MICROZIDE) 12.5 MG capsule, TAKE 1 CAPSULE BY MOUTH EVERY DAY, Disp: 90 capsule, Rfl: 1 .  levothyroxine (SYNTHROID) 75 MCG tablet, TAKE 1 TABLET BY MOUTH EVERY DAY EXCEPT ON SUNDAY ONLY TAKE 1&1/2 TABS, Disp: 96 tablet, Rfl: 0 .  losartan (COZAAR) 100 MG tablet, Take 1 tablet (100 mg total) by mouth daily., Disp: 30 tablet, Rfl: 11 .  Multiple Vitamins-Minerals (CENTRUM SILVER ADULT 50+ PO), Take 1 tablet by mouth daily., Disp: , Rfl:  .  pravastatin (PRAVACHOL) 40 MG tablet, TAKE 1 TABLET BY MOUTH EVERY DAY, Disp: 90 tablet, Rfl: 3 .  traMADol (ULTRAM) 50 MG tablet, Take 50 mg by mouth every 6 (six) hours as needed., Disp: , Rfl:    Observations/Objective: Blood pressure (!) 158/60, pulse 93, temperature 98.4 F (36.9 C), temperature source Temporal, height 5\' 2"  (1.575 m), weight 158 lb (71.7 kg), SpO2 98 %.  Physical Exam Constitutional:      General: She is not in acute distress.  Appearance: Normal appearance. She is well-developed. She is not ill-appearing or toxic-appearing.  HENT:     Head: Normocephalic.     Right Ear: Hearing, tympanic membrane, ear canal and external ear normal. Tympanic membrane is not erythematous, retracted or bulging.     Left Ear: Hearing, tympanic membrane, ear canal and external ear normal. Tympanic membrane is not erythematous, retracted or bulging.     Nose: No mucosal edema or rhinorrhea.     Right Sinus: No maxillary sinus tenderness or frontal sinus tenderness.     Left Sinus:  No maxillary sinus tenderness or frontal sinus tenderness.     Mouth/Throat:     Pharynx: Uvula midline.  Eyes:     General: Lids are normal. Lids are everted, no foreign bodies appreciated.     Conjunctiva/sclera: Conjunctivae normal.     Pupils: Pupils are equal, round, and reactive to light.  Neck:     Thyroid: No thyroid mass or thyromegaly.     Vascular: No carotid bruit.     Trachea: Trachea normal.  Cardiovascular:     Rate and Rhythm: Normal rate and regular rhythm.     Pulses: Normal pulses.     Heart sounds: Normal heart sounds, S1 normal and S2 normal. No murmur. No friction rub. No gallop.   Pulmonary:     Effort: Pulmonary effort is normal. No tachypnea or respiratory distress.     Breath sounds: Normal breath sounds. No decreased breath sounds, wheezing, rhonchi or rales.  Abdominal:     General: Bowel sounds are normal.     Palpations: Abdomen is soft.     Tenderness: There is no abdominal tenderness.  Musculoskeletal:     Cervical back: Normal range of motion and neck supple.  Skin:    General: Skin is warm and dry.     Findings: No rash.  Neurological:     Mental Status: She is alert.  Psychiatric:        Mood and Affect: Mood is not anxious or depressed.        Speech: Speech normal.        Behavior: Behavior normal. Behavior is cooperative.        Thought Content: Thought content normal.        Judgment: Judgment normal.      Assessment and Plan Malignant hypertension  Follow BP on new cuff.. goal < 140/90.  If above  >150/ 90 take an additional HCTZ 12.5 mg (hydrochlorothiazide) Call with BP  measurements in next 1-2 weeks.   Low serum potassium Replete and follow up. May be due to po intake decreased or HCTZ       Eliezer Lofts, MD

## 2019-02-25 NOTE — Telephone Encounter (Signed)
Noted  

## 2019-02-25 NOTE — Patient Instructions (Signed)
Take potassium prescription daily x 2 days.  Follow BP on new cuff.. goal < 140/90.  If above  >150/ 90 take an additional HCTZ 12.5 mg (hydrochlorothiazide) Call with BP  measurements in next 1-2 weeks.

## 2019-02-28 ENCOUNTER — Other Ambulatory Visit: Payer: Self-pay | Admitting: Family Medicine

## 2019-02-28 ENCOUNTER — Telehealth: Payer: Self-pay | Admitting: *Deleted

## 2019-02-28 ENCOUNTER — Encounter: Payer: Self-pay | Admitting: Family Medicine

## 2019-02-28 NOTE — Telephone Encounter (Signed)
Left message for Alexandria Petersen that her juror letter is ready to be picked up here at our office.  Letter placed up front.

## 2019-03-05 ENCOUNTER — Other Ambulatory Visit: Payer: Self-pay

## 2019-03-05 ENCOUNTER — Ambulatory Visit
Admission: RE | Admit: 2019-03-05 | Discharge: 2019-03-05 | Disposition: A | Discharge status: Home / Self Care | Payer: Medicare Other | Source: Ambulatory Visit | Attending: Family Medicine | Admitting: Family Medicine

## 2019-03-05 DIAGNOSIS — Z1231 Encounter for screening mammogram for malignant neoplasm of breast: Secondary | ICD-10-CM | POA: Diagnosis not present

## 2019-03-07 ENCOUNTER — Ambulatory Visit (INDEPENDENT_AMBULATORY_CARE_PROVIDER_SITE_OTHER): Payer: Medicare Other | Admitting: Family Medicine

## 2019-03-07 ENCOUNTER — Other Ambulatory Visit: Payer: Self-pay

## 2019-03-07 VITALS — BP 151/75 | HR 103 | Temp 97.3°F | Ht 62.0 in | Wt 160.0 lb

## 2019-03-07 DIAGNOSIS — E876 Hypokalemia: Secondary | ICD-10-CM

## 2019-03-07 DIAGNOSIS — I10 Essential (primary) hypertension: Secondary | ICD-10-CM

## 2019-03-07 LAB — POTASSIUM: Potassium: 3.5 mEq/L (ref 3.5–5.1)

## 2019-03-07 LAB — MAGNESIUM: Magnesium: 2 mg/dL (ref 1.5–2.5)

## 2019-03-07 NOTE — Progress Notes (Signed)
Chief Complaint  Patient presents with  . Blood Pressure Check    History of Present Illness: HPI   79 year old female presents for follow up BP.   At last OV on 02/24/2018.Marland Kitchen given potassium to take for 2 days given low potassium during ER visit for malignant HTN on 02/24/2019. BP at home on new BP cuff running: 129-144/74-82  BP Readings from Last 3 Encounters:  03/07/19 (!) 151/75  02/25/19 (!) 158/60  02/24/19 125/79    Working on low salt in diet.    This visit occurred during the SARS-CoV-2 public health emergency.  Safety protocols were in place, including screening questions prior to the visit, additional usage of staff PPE, and extensive cleaning of exam room while observing appropriate contact time as indicated for disinfecting solutions.   COVID 19 screen:  No recent travel or known exposure to COVID19 The patient denies respiratory symptoms of COVID 19 at this time. The importance of social distancing was discussed today.     Review of Systems  Constitutional: Negative for chills and fever.  HENT: Negative for congestion and ear pain.   Eyes: Negative for pain and redness.  Respiratory: Negative for cough and shortness of breath.   Cardiovascular: Negative for chest pain, palpitations and leg swelling.  Gastrointestinal: Negative for abdominal pain, blood in stool, constipation, diarrhea, nausea and vomiting.  Genitourinary: Negative for dysuria.  Musculoskeletal: Negative for falls and myalgias.  Skin: Negative for rash.  Neurological: Negative for dizziness.  Psychiatric/Behavioral: Negative for depression. The patient is not nervous/anxious.       Past Medical History:  Diagnosis Date  . Arthritis   . HLD (hyperlipidemia)   . Hypertension   . Hypothyroidism   . Osteoporosis     reports that she has never smoked. She has never used smokeless tobacco. She reports that she does not drink alcohol or use drugs.   Current Outpatient Medications:  .   acetaminophen (TYLENOL) 650 MG CR tablet, Take 650 mg by mouth every 8 (eight) hours as needed for pain., Disp: , Rfl:  .  hydrochlorothiazide (MICROZIDE) 12.5 MG capsule, TAKE 1 CAPSULE BY MOUTH EVERY DAY, Disp: 90 capsule, Rfl: 1 .  levothyroxine (SYNTHROID) 75 MCG tablet, TAKE 1 TABLET BY MOUTH EVERY DAY EXCEPT ON SUNDAY ONLY TAKE 1&1/2 TABS, Disp: 96 tablet, Rfl: 0 .  losartan (COZAAR) 100 MG tablet, Take 1 tablet (100 mg total) by mouth daily., Disp: 30 tablet, Rfl: 11 .  Multiple Vitamins-Minerals (CENTRUM SILVER ADULT 50+ PO), Take 1 tablet by mouth daily., Disp: , Rfl:  .  potassium chloride SA (KLOR-CON) 20 MEQ tablet, Take 1 tablet (20 mEq total) by mouth daily for 2 days., Disp: 2 tablet, Rfl: 0 .  pravastatin (PRAVACHOL) 40 MG tablet, TAKE 1 TABLET BY MOUTH EVERY DAY, Disp: 90 tablet, Rfl: 3 .  traMADol (ULTRAM) 50 MG tablet, Take 50 mg by mouth every 6 (six) hours as needed., Disp: , Rfl:    Observations/Objective: Blood pressure (!) 151/75, pulse (!) 103, temperature (!) 97.3 F (36.3 C), temperature source Temporal, height 5\' 2"  (1.575 m), weight 160 lb (72.6 kg), SpO2 98 %.  Physical Exam Constitutional:      General: She is not in acute distress.    Appearance: Normal appearance. She is well-developed. She is not ill-appearing or toxic-appearing.     Comments: Elderly in NAD  HENT:     Head: Normocephalic.     Right Ear: Hearing, tympanic membrane, ear canal and  external ear normal. Tympanic membrane is not erythematous, retracted or bulging.     Left Ear: Hearing, tympanic membrane, ear canal and external ear normal. Tympanic membrane is not erythematous, retracted or bulging.     Nose: No mucosal edema or rhinorrhea.     Right Sinus: No maxillary sinus tenderness or frontal sinus tenderness.     Left Sinus: No maxillary sinus tenderness or frontal sinus tenderness.     Mouth/Throat:     Pharynx: Uvula midline.  Eyes:     General: Lids are normal. Lids are everted, no  foreign bodies appreciated.     Conjunctiva/sclera: Conjunctivae normal.     Pupils: Pupils are equal, round, and reactive to light.  Neck:     Thyroid: No thyroid mass or thyromegaly.     Vascular: No carotid bruit.     Trachea: Trachea normal.  Cardiovascular:     Rate and Rhythm: Normal rate and regular rhythm.     Pulses: Normal pulses.     Heart sounds: Normal heart sounds, S1 normal and S2 normal. No murmur. No friction rub. No gallop.   Pulmonary:     Effort: Pulmonary effort is normal. No tachypnea or respiratory distress.     Breath sounds: Normal breath sounds. No decreased breath sounds, wheezing, rhonchi or rales.  Abdominal:     General: Bowel sounds are normal.     Palpations: Abdomen is soft.     Tenderness: There is no abdominal tenderness.  Musculoskeletal:     Cervical back: Normal range of motion and neck supple.  Skin:    General: Skin is warm and dry.     Findings: No rash.  Neurological:     Mental Status: She is alert.  Psychiatric:        Mood and Affect: Mood is not anxious or depressed.        Speech: Speech normal.        Behavior: Behavior normal. Behavior is cooperative.        Thought Content: Thought content normal.        Judgment: Judgment normal.      Assessment and Plan Malignant hypertension  Improved control on current regimen. ? If elevation was secondary to  COVID vaccine reaction, +/_ anxiety response.  BP seems to be coming down on current regimen. She is on 1/2 dose HCTZ, so she can increase this to 25 mg for BP remaining up. If doing so will need to continue to follow potassium.   Low serum potassium Likely due to HCTZ. Re-eval today.     Eliezer Lofts, MD

## 2019-03-07 NOTE — Patient Instructions (Signed)
Continue following blood pressure once daily.  Keep up the walking.  Please stop at the lab to have labs drawn.  If BP > 150/90.Marland Kitchen you can take an additional 1 tablet HCTZ.

## 2019-03-12 ENCOUNTER — Other Ambulatory Visit: Payer: Self-pay | Admitting: Family Medicine

## 2019-03-12 ENCOUNTER — Ambulatory Visit: Payer: Medicare Other | Attending: Internal Medicine

## 2019-03-12 DIAGNOSIS — Z23 Encounter for immunization: Secondary | ICD-10-CM | POA: Insufficient documentation

## 2019-03-12 NOTE — Assessment & Plan Note (Signed)
Some element of white coat HTN. Follow at home  ( has been well controlled at home)if remaining elevated will need to adjust medication.

## 2019-03-12 NOTE — Assessment & Plan Note (Signed)
Well controlled 

## 2019-03-12 NOTE — Assessment & Plan Note (Signed)
LDL almost at goal on pravastatin 40 mg daily

## 2019-03-12 NOTE — Assessment & Plan Note (Addendum)
Improved control on current regimen. ? If elevation was secondary to  COVID vaccine reaction, +/_ anxiety response.  BP seems to be coming down on current regimen. She is on 1/2 dose HCTZ, so she can increase this to 25 mg for BP remaining up. If doing so will need to continue to follow potassium.

## 2019-03-12 NOTE — Assessment & Plan Note (Addendum)
Likely due to HCTZ. Re-eval today after 2 days of supplement.

## 2019-03-12 NOTE — Progress Notes (Signed)
   Covid-19 Vaccination Clinic  Name:  Alexandria Petersen    MRN: [id number] DOB: 1940-05-28  03/12/2019  Alexandria Petersen was observed post Covid-19 immunization for 15 minutes without incident. She was provided with Vaccine Information Sheet and instruction to access the V-Safe system.   Alexandria Petersen was instructed to call 911 with any severe reactions post vaccine: Marland Kitchen Difficulty breathing  . Swelling of face and throat  . A fast heartbeat  . A bad rash all over body  . Dizziness and weakness   Immunizations Administered    Name Date Dose VIS Date Route   Pfizer COVID-19 Vaccine 03/12/2019 10:45 AM 0.3 mL 12/20/2018 Intramuscular   Manufacturer: [place]   Lot: [id number]   [place]: [id number]

## 2019-03-12 NOTE — Assessment & Plan Note (Signed)
Good control on levothyroxine 75 mcg

## 2019-03-17 NOTE — Assessment & Plan Note (Signed)
Follow BP on new cuff.. goal < 140/90.  If above  >150/ 90 take an additional HCTZ 12.5 mg (hydrochlorothiazide) Call with BP  measurements in next 1-2 weeks.

## 2019-03-17 NOTE — Assessment & Plan Note (Signed)
Replete and follow up. May be due to po intake decreased or HCTZ

## 2019-03-18 ENCOUNTER — Other Ambulatory Visit: Payer: Self-pay

## 2019-03-18 ENCOUNTER — Ambulatory Visit: Payer: Medicare Other | Admitting: Endocrinology

## 2019-03-18 ENCOUNTER — Encounter: Payer: Self-pay | Admitting: Endocrinology

## 2019-03-18 VITALS — BP 152/72 | HR 124 | Ht 62.0 in | Wt 159.4 lb

## 2019-03-18 DIAGNOSIS — I10 Essential (primary) hypertension: Secondary | ICD-10-CM

## 2019-03-18 DIAGNOSIS — E890 Postprocedural hypothyroidism: Secondary | ICD-10-CM | POA: Diagnosis not present

## 2019-03-18 LAB — T4, FREE: Free T4: 1.52 ng/dL (ref 0.60–1.60)

## 2019-03-18 LAB — TSH: TSH: 1.4 u[IU]/mL (ref 0.35–4.50)

## 2019-03-18 NOTE — Progress Notes (Signed)
Patient ID: Alexandria Petersen, female   DOB: [birth year], 79 y.o.   MRN: [id number]                                                                                                               Reason for Appointment: Hypothyroidism, follow up     History of Present Illness:   Prior history: When she was first seen in 12/16 she had symptoms of weight loss,  palpitations, feeling tired, occasionally feeling warm and some weakness in her legs. Also was having decreased appetite.  She previously had suppressed TSH levels since at least 10/2013 Pretreatment labs showed increased free T4 and free T3 levels in 12/16  She was also found to have a goiter on exam initially.  Was started on Methimazole 5 mg twice a day in 12/2014   Her methimazole was tapered off and stopped in 09/2015 when her free T4 was low normal However without her methimazole she had a recurrence of her hyperthyroidism in 1/18; she was having symptoms of palpitations and fatigue She finally had her I-131 treatment on 05/02/16 with 18.1 mCi   RECENT history:  She had become hypothyroid in 07/2016 when she was having fatigue, increased weight and hoarseness She has been started on levothyroxine, initially 112 g and then this was reduced gradually  She has been on 75 mcg since 2019 Her TSH was low normal at 0.35 in 7/19 and she was told to be taking 6-1/2 tablets/week  However because of her TSH being 8.1 she was told to go up on her dose  She is continuing to be taking 1-1/2 tablets on Sundays and 1 tablet on the other days giving her an average of 80 mcg daily  Her dose was continued unchanged in 9/20  She feels fairly good with no unusual fatigue Weight is about the same She has no cold intolerance except her feet get cold  She has been regular with taking her levothyroxine before breakfast daily and has not forgotten any medication doses  Her TSH is pending  Wt Readings from Last 3 Encounters:    03/18/19 159 lb 6.4 oz (72.3 kg)  03/07/19 160 lb (72.6 kg)  02/25/19 158 lb (71.7 kg)   Lab Results  Component Value Date   TSH 1.40 03/18/2019   TSH 1.38 09/17/2018   TSH 8.11 (H) 06/05/2018   FREET4 1.52 03/18/2019   FREET4 1.14 09/17/2018   FREET4 1.07 06/05/2018       Allergies as of 03/18/2019      Reactions   Penicillins    REACTION: hallucinations      Medication List       Accurate as of March 18, 2019 11:59 PM. If you have any questions, ask your nurse or doctor.        acetaminophen 650 MG CR tablet Commonly known as: TYLENOL Take 650 mg by mouth every 8 (eight) hours as needed for pain.   CENTRUM SILVER ADULT 50+ PO Take 1 tablet by mouth daily.  hydrochlorothiazide 12.5 MG capsule Commonly known as: MICROZIDE TAKE 1 CAPSULE BY MOUTH EVERY DAY   levothyroxine 75 MCG tablet Commonly known as: SYNTHROID TAKE 1 TABLET BY MOUTH EVERY DAY EXCEPT ON SUNDAY ONLY TAKE 1&1/2 TABS   losartan 100 MG tablet Commonly known as: COZAAR Take 1 tablet (100 mg total) by mouth daily.   pravastatin 40 MG tablet Commonly known as: PRAVACHOL TAKE 1 TABLET BY MOUTH EVERY DAY   traMADol 50 MG tablet Commonly known as: ULTRAM Take 50 mg by mouth every 6 (six) hours as needed.           Past Medical History:  Diagnosis Date  . Arthritis   . HLD (hyperlipidemia)   . Hypertension   . Hypothyroidism   . Osteoporosis     Past Surgical History:  Procedure Laterality Date  . BREAST BIOPSY Left   . HEMORROIDECTOMY     ?  . ROTATOR CUFF REPAIR Right   . TUBAL LIGATION      Family History  Problem Relation Age of Onset  . Hypertension Mother   . Diabetes Mother   . Hypertension Father   . Diabetes Sister   . Diabetes Brother   . Hypothyroidism Daughter   . Heart disease Son   . Hypothyroidism Son     Social History:  reports that she has never smoked. She has never used smokeless tobacco. She reports that she does not drink alcohol or use  drugs.  Allergies:  Allergies  Allergen Reactions  . Penicillins     REACTION: hallucinations     Review of Systems  Blood pressure is being treated with losartan and HCTZ  Hypertension has been managed by her PCP She is followed by her PCP and she says her blood pressure at home is about Q000111Q systolic recently  Repeat blood pressure was about Q000111Q systolic compared to [id number] when she came in  BP Readings from Last 3 Encounters:  03/18/19 (!) 152/72  03/07/19 (!) 151/75  02/25/19 (!) 158/60    She has impaired fasting glucose A1c 5.8    Examination:   BP (!) 152/72   Pulse (!) 124   Ht 5\' 2"  (1.575 m)   Wt 159 lb 6.4 oz (72.3 kg)   SpO2 98%   BMI 29.15 kg/m   Neck exam normal No tremor Biceps reflexes appear normal    Assessment/Plan:  Post-ablative hypothyroidism since 07/2016  She is taking the equivalent of 80 mcg of levothyroxine now Her TSH was normal on the last visit and subjectively she is doing well recently She has been regular with her supplement and understands that this is needed long-term  Labs will be rechecked today to decide on her doses She will try to get her labs done ahead of time to her PCP office on the next visit  HYPERTENSION: Blood pressure is high but appears to have whitecoat syndrome as her blood pressure came down about 40 mg systolic at the end of the visit  She needs to follow-up with PCP if blood pressure stays consistently high at home  She has questions about the safety and desirability of the Covid vaccine and recommended that she take it and not be concerned about major side effects  There are no Patient Instructions on file for this visit.     Elayne Snare 03/19/2019, 8:31 AM   Note: This office note was prepared with Dragon voice recognition system technology. Any transcriptional errors that result from this process are unintentional.  Addendum: TSH normal, she will see [place] back in 6 months

## 2019-03-19 NOTE — Progress Notes (Signed)
Please call to let patient know that the lab results are normal and no change in medication needed, follow-up in 6 months

## 2019-03-31 DIAGNOSIS — E113393 Type 2 diabetes mellitus with moderate nonproliferative diabetic retinopathy without macular edema, bilateral: Secondary | ICD-10-CM | POA: Diagnosis not present

## 2019-05-23 DIAGNOSIS — H2513 Age-related nuclear cataract, bilateral: Secondary | ICD-10-CM | POA: Diagnosis not present

## 2019-06-02 ENCOUNTER — Other Ambulatory Visit: Payer: Self-pay | Admitting: Endocrinology

## 2019-06-02 ENCOUNTER — Other Ambulatory Visit: Payer: Self-pay | Admitting: Family Medicine

## 2019-06-02 ENCOUNTER — Other Ambulatory Visit: Payer: Self-pay | Admitting: *Deleted

## 2019-06-02 NOTE — Telephone Encounter (Signed)
Last office visit 03/07/2019 for BP check.  Diclofenac Gel is not on current mediation list.  Next Appt: 05/27/20201 for 3 month follow up on HTN. Ok to refill?

## 2019-06-05 ENCOUNTER — Encounter: Payer: Self-pay | Admitting: Family Medicine

## 2019-06-05 ENCOUNTER — Ambulatory Visit (INDEPENDENT_AMBULATORY_CARE_PROVIDER_SITE_OTHER): Payer: Medicare Other | Admitting: Family Medicine

## 2019-06-05 ENCOUNTER — Other Ambulatory Visit: Payer: Self-pay

## 2019-06-05 VITALS — BP 164/60 | HR 96 | Temp 98.7°F | Ht 62.0 in | Wt 157.1 lb

## 2019-06-05 DIAGNOSIS — I10 Essential (primary) hypertension: Secondary | ICD-10-CM | POA: Diagnosis not present

## 2019-06-05 NOTE — Patient Instructions (Addendum)
Continue current medication.   Please stop at the lab to have labs drawn. IF BP is higher or you are going to doctor office you can take  25 mg of hydrochlorothiazide ( ie. 2 tabs)

## 2019-06-05 NOTE — Assessment & Plan Note (Signed)
Blood pressures at home are at goal on low dose HCTZ and losartan max. Eval BMET for potassium.  Element of white coat HTN presents given in office BP elevated... can take extra tablet prior to MD OV if she would like given the elevation of BP in office upsets her.  If potassium low again consider change to amlodipine.

## 2019-06-05 NOTE — Progress Notes (Signed)
Chief Complaint  Patient presents with  . Follow-up    HTN    History of Present Illness: HPI    79 year old female presents for follow up HTN  Hypertension:   On losartan and HCTZ ( in past potassium has dropped with HCTZ) BP Readings from Last 3 Encounters:  06/05/19 (!) 164/60  03/18/19 (!) 152/72  03/07/19 (!) 151/75  Using medication without problems or lightheadedness:  none Chest pain with exertion:none Edema:none Short of breath:none Average home BPs:  114-136/62-71 Other issues:     This visit occurred during the SARS-CoV-2 public health emergency.  Safety protocols were in place, including screening questions prior to the visit, additional usage of staff PPE, and extensive cleaning of exam room while observing appropriate contact time as indicated for disinfecting solutions.   COVID 19 screen:  No recent travel or known exposure to COVID19 The patient denies respiratory symptoms of COVID 19 at this time. The importance of social distancing was discussed today.     Review of Systems  Constitutional: Negative for chills and fever.  HENT: Negative for congestion and ear pain.   Eyes: Negative for pain and redness.  Respiratory: Negative for cough and shortness of breath.   Cardiovascular: Negative for chest pain, palpitations and leg swelling.  Gastrointestinal: Negative for abdominal pain, blood in stool, constipation, diarrhea, nausea and vomiting.  Genitourinary: Negative for dysuria.  Musculoskeletal: Negative for falls and myalgias.  Skin: Negative for rash.  Neurological: Negative for dizziness.  Psychiatric/Behavioral: Negative for depression. The patient is not nervous/anxious.       Past Medical History:  Diagnosis Date  . Arthritis   . HLD (hyperlipidemia)   . Hypertension   . Hypothyroidism   . Osteoporosis     reports that she has never smoked. She has never used smokeless tobacco. She reports that she does not drink alcohol or use drugs.    Current Outpatient Medications:  .  acetaminophen (TYLENOL) 650 MG CR tablet, Take 650 mg by mouth every 8 (eight) hours as needed for pain., Disp: , Rfl:  .  hydrochlorothiazide (MICROZIDE) 12.5 MG capsule, TAKE 1 CAPSULE BY MOUTH EVERY DAY, Disp: 90 capsule, Rfl: 1 .  KLOR-CON M20 20 MEQ tablet, , Disp: , Rfl:  .  Lactobacillus Rhamnosus, GG, (CULTURELLE) CAPS, , Disp: , Rfl:  .  levothyroxine (SYNTHROID) 75 MCG tablet, TAKE 1 TABLET BY MOUTH EVERY DAY EXCEPT ON SUNDAY ONLY TAKE 1&1/2 TABS, Disp: 96 tablet, Rfl: 1 .  losartan (COZAAR) 100 MG tablet, Take 1 tablet (100 mg total) by mouth daily., Disp: 30 tablet, Rfl: 11 .  Multiple Vitamins-Minerals (CENTRUM SILVER ADULT 50+ PO), Take 1 tablet by mouth daily., Disp: , Rfl:  .  pravastatin (PRAVACHOL) 40 MG tablet, TAKE 1 TABLET BY MOUTH EVERY DAY, Disp: 90 tablet, Rfl: 3   Observations/Objective: Blood pressure (!) 164/60, pulse 96, temperature 98.7 F (37.1 C), temperature source Temporal, height 5\' 2"  (1.575 m), weight 157 lb 2 oz (71.3 kg), SpO2 98 %.  Physical Exam Constitutional:      General: She is not in acute distress.    Appearance: Normal appearance. She is well-developed. She is not ill-appearing or toxic-appearing.  HENT:     Head: Normocephalic.     Right Ear: Hearing, tympanic membrane, ear canal and external ear normal. Tympanic membrane is not erythematous, retracted or bulging.     Left Ear: Hearing, tympanic membrane, ear canal and external ear normal. Tympanic membrane is not erythematous,  retracted or bulging.     Nose: No mucosal edema or rhinorrhea.     Right Sinus: No maxillary sinus tenderness or frontal sinus tenderness.     Left Sinus: No maxillary sinus tenderness or frontal sinus tenderness.     Mouth/Throat:     Pharynx: Uvula midline.  Eyes:     General: Lids are normal. Lids are everted, no foreign bodies appreciated.     Conjunctiva/sclera: Conjunctivae normal.     Pupils: Pupils are equal, round,  and reactive to light.  Neck:     Thyroid: No thyroid mass or thyromegaly.     Vascular: No carotid bruit.     Trachea: Trachea normal.  Cardiovascular:     Rate and Rhythm: Normal rate and regular rhythm.     Pulses: Normal pulses.     Heart sounds: Normal heart sounds, S1 normal and S2 normal. No murmur. No friction rub. No gallop.   Pulmonary:     Effort: Pulmonary effort is normal. No tachypnea or respiratory distress.     Breath sounds: Normal breath sounds. No decreased breath sounds, wheezing, rhonchi or rales.  Abdominal:     General: Bowel sounds are normal.     Palpations: Abdomen is soft.     Tenderness: There is no abdominal tenderness.  Musculoskeletal:     Cervical back: Normal range of motion and neck supple.  Skin:    General: Skin is warm and dry.     Findings: No rash.  Neurological:     Mental Status: She is alert.  Psychiatric:        Mood and Affect: Mood is not anxious or depressed.        Speech: Speech normal.        Behavior: Behavior normal. Behavior is cooperative.        Thought Content: Thought content normal.        Judgment: Judgment normal.      Assessment and Plan   HYPERTENSION, BENIGN ESSENTIAL  Blood pressures at home are at goal on low dose HCTZ and losartan max. Eval BMET for potassium.  Element of white coat HTN presents given in office BP elevated... can take extra tablet prior to MD OV if she would like given the elevation of BP in office upsets her.  If potassium low again consider change to amlodipine.     Eliezer Lofts, MD

## 2019-06-06 LAB — BASIC METABOLIC PANEL
BUN: 16 mg/dL (ref 6–23)
CO2: 26 mEq/L (ref 19–32)
Calcium: 9.7 mg/dL (ref 8.4–10.5)
Chloride: 101 mEq/L (ref 96–112)
Creatinine, Ser: 1.05 mg/dL (ref 0.40–1.20)
GFR: 60.94 mL/min (ref >60.00)
Glucose, Bld: 153 mg/dL — ABNORMAL HIGH (ref 70–99)
Potassium: 3.5 mEq/L (ref 3.5–5.1)
Sodium: 136 mEq/L (ref 135–145)

## 2019-06-16 DIAGNOSIS — I10 Essential (primary) hypertension: Secondary | ICD-10-CM | POA: Diagnosis not present

## 2019-06-16 DIAGNOSIS — H2512 Age-related nuclear cataract, left eye: Secondary | ICD-10-CM | POA: Diagnosis not present

## 2019-06-24 ENCOUNTER — Encounter: Payer: Self-pay | Admitting: Ophthalmology

## 2019-06-24 ENCOUNTER — Other Ambulatory Visit: Payer: Self-pay

## 2019-06-30 ENCOUNTER — Other Ambulatory Visit: Payer: Self-pay

## 2019-06-30 ENCOUNTER — Other Ambulatory Visit
Admission: RE | Admit: 2019-06-30 | Discharge: 2019-06-30 | Disposition: A | Discharge status: Home / Self Care | Payer: Medicare Other | Source: Ambulatory Visit | Attending: Ophthalmology | Admitting: Ophthalmology

## 2019-06-30 DIAGNOSIS — Z01812 Encounter for preprocedural laboratory examination: Secondary | ICD-10-CM | POA: Diagnosis not present

## 2019-06-30 DIAGNOSIS — Z20822 Contact with and (suspected) exposure to covid-19: Secondary | ICD-10-CM | POA: Insufficient documentation

## 2019-07-01 LAB — SARS CORONAVIRUS 2 (TAT 6-24 HRS): SARS Coronavirus 2: NEGATIVE

## 2019-07-01 NOTE — Discharge Instructions (Signed)

## 2019-07-02 ENCOUNTER — Other Ambulatory Visit: Payer: Self-pay

## 2019-07-02 ENCOUNTER — Ambulatory Visit: Payer: Medicare Other | Admitting: Anesthesiology

## 2019-07-02 ENCOUNTER — Encounter
Admission: RE | Disposition: A | Discharge status: Home / Self Care | Payer: Self-pay | Source: Home / Self Care | Attending: Ophthalmology

## 2019-07-02 ENCOUNTER — Encounter: Payer: Self-pay | Admitting: Ophthalmology

## 2019-07-02 ENCOUNTER — Ambulatory Visit
Admission: RE | Admit: 2019-07-02 | Discharge: 2019-07-02 | Disposition: A | Discharge status: Home / Self Care | Payer: Medicare Other | Attending: Ophthalmology | Admitting: Ophthalmology

## 2019-07-02 DIAGNOSIS — Z880 Allergy status to penicillin: Secondary | ICD-10-CM | POA: Insufficient documentation

## 2019-07-02 DIAGNOSIS — H2512 Age-related nuclear cataract, left eye: Secondary | ICD-10-CM | POA: Insufficient documentation

## 2019-07-02 DIAGNOSIS — E7800 Pure hypercholesterolemia, unspecified: Secondary | ICD-10-CM | POA: Diagnosis not present

## 2019-07-02 DIAGNOSIS — I10 Essential (primary) hypertension: Secondary | ICD-10-CM | POA: Insufficient documentation

## 2019-07-02 DIAGNOSIS — E039 Hypothyroidism, unspecified: Secondary | ICD-10-CM | POA: Insufficient documentation

## 2019-07-02 DIAGNOSIS — Z79899 Other long term (current) drug therapy: Secondary | ICD-10-CM | POA: Diagnosis not present

## 2019-07-02 DIAGNOSIS — M170 Bilateral primary osteoarthritis of knee: Secondary | ICD-10-CM | POA: Diagnosis not present

## 2019-07-02 DIAGNOSIS — Z79890 Hormone replacement therapy (postmenopausal): Secondary | ICD-10-CM | POA: Diagnosis not present

## 2019-07-02 DIAGNOSIS — H25812 Combined forms of age-related cataract, left eye: Secondary | ICD-10-CM | POA: Diagnosis not present

## 2019-07-02 HISTORY — PX: CATARACT EXTRACTION W/PHACO: SHX586

## 2019-07-02 HISTORY — DX: Presence of dental prosthetic device (complete) (partial): Z97.2

## 2019-07-02 SURGERY — PHACOEMULSIFICATION, CATARACT, WITH IOL INSERTION
Anesthesia: Monitor Anesthesia Care | Site: Eye | Laterality: Left

## 2019-07-02 MED ORDER — MOXIFLOXACIN HCL 0.5 % OP SOLN
1.0000 [drp] | OPHTHALMIC | Status: DC | PRN
  Administered 2019-07-02 (×3): 1 [drp] via OPHTHALMIC

## 2019-07-02 MED ORDER — EPINEPHRINE PF 1 MG/ML IJ SOLN
INTRAOCULAR | Status: DC | PRN
  Administered 2019-07-02: 73 mL via OPHTHALMIC

## 2019-07-02 MED ORDER — ACETAMINOPHEN 325 MG PO TABS: 325.0000 mg | ORAL_TABLET | ORAL | Status: DC | PRN

## 2019-07-02 MED ORDER — ACETAMINOPHEN 160 MG/5ML PO SOLN: 325.0000 mg | ORAL | Status: DC | PRN

## 2019-07-02 MED ORDER — MIDAZOLAM HCL 2 MG/2ML IJ SOLN
INTRAMUSCULAR | Status: DC | PRN
  Administered 2019-07-02: 1 mg via INTRAVENOUS

## 2019-07-02 MED ORDER — ARMC OPHTHALMIC DILATING DROPS
1.0000 "application " | OPHTHALMIC | Status: DC | PRN
  Administered 2019-07-02 (×3): 1 via OPHTHALMIC

## 2019-07-02 MED ORDER — FENTANYL CITRATE (PF) 100 MCG/2ML IJ SOLN
INTRAMUSCULAR | Status: DC | PRN
  Administered 2019-07-02: 50 ug via INTRAVENOUS

## 2019-07-02 MED ORDER — BRIMONIDINE TARTRATE-TIMOLOL 0.2-0.5 % OP SOLN
OPHTHALMIC | Status: DC | PRN
  Administered 2019-07-02: 1 [drp] via OPHTHALMIC

## 2019-07-02 MED ORDER — ONDANSETRON HCL 4 MG/2ML IJ SOLN: 4.0000 mg | Freq: Once | INTRAMUSCULAR | Status: DC | PRN

## 2019-07-02 MED ORDER — NA HYALUR & NA CHOND-NA HYALUR 0.4-0.35 ML IO KIT
PACK | INTRAOCULAR | Status: DC | PRN
  Administered 2019-07-02: 1 mL via INTRAOCULAR

## 2019-07-02 MED ORDER — CEFUROXIME OPHTHALMIC INJECTION 1 MG/0.1 ML
INJECTION | OPHTHALMIC | Status: DC | PRN
  Administered 2019-07-02: 0.1 mL via INTRACAMERAL

## 2019-07-02 MED ORDER — TETRACAINE HCL 0.5 % OP SOLN
1.0000 [drp] | OPHTHALMIC | Status: DC | PRN
  Administered 2019-07-02 (×3): 1 [drp] via OPHTHALMIC

## 2019-07-02 MED ORDER — LIDOCAINE HCL (PF) 2 % IJ SOLN
INTRAOCULAR | Status: DC | PRN
  Administered 2019-07-02: 2 mL

## 2019-07-02 SURGICAL SUPPLY — 23 items
CANNULA ANT/CHMB 27G (MISCELLANEOUS) ×1 IMPLANT
CANNULA ANT/CHMB 27GA (MISCELLANEOUS) ×3 IMPLANT
GLOVE SURG LX 7.5 STRW (GLOVE) ×2
GLOVE SURG LX STRL 7.5 STRW (GLOVE) ×1 IMPLANT
GLOVE SURG TRIUMPH 8.0 PF LTX (GLOVE) ×3 IMPLANT
GOWN STRL REUS W/ TWL LRG LVL3 (GOWN DISPOSABLE) ×2 IMPLANT
GOWN STRL REUS W/TWL LRG LVL3 (GOWN DISPOSABLE) ×6
LENS IOL DIOP 24.0 (Intraocular Lens) ×3 IMPLANT
LENS IOL TECNIS MONO 24.0 (Intraocular Lens) IMPLANT
MARKER SKIN DUAL TIP RULER LAB (MISCELLANEOUS) ×3 IMPLANT
NDL CAPSULORHEX 25GA (NEEDLE) ×1 IMPLANT
NDL FILTER BLUNT 18X1 1/2 (NEEDLE) ×2 IMPLANT
NEEDLE CAPSULORHEX 25GA (NEEDLE) ×3 IMPLANT
NEEDLE FILTER BLUNT 18X 1/2SAF (NEEDLE) ×4
NEEDLE FILTER BLUNT 18X1 1/2 (NEEDLE) ×2 IMPLANT
PACK CATARACT BRASINGTON (MISCELLANEOUS) ×3 IMPLANT
PACK EYE AFTER SURG (MISCELLANEOUS) ×3 IMPLANT
PACK OPTHALMIC (MISCELLANEOUS) ×3 IMPLANT
SOLUTION OPHTHALMIC SALT (MISCELLANEOUS) ×3 IMPLANT
SYR 3ML LL SCALE MARK (SYRINGE) ×6 IMPLANT
SYR TB 1ML LUER SLIP (SYRINGE) ×3 IMPLANT
WATER STERILE IRR 250ML POUR (IV SOLUTION) ×3 IMPLANT
WIPE NON LINTING 3.25X3.25 (MISCELLANEOUS) ×3 IMPLANT

## 2019-07-02 NOTE — Anesthesia Postprocedure Evaluation (Signed)
Anesthesia Post Note  Patient: Alexandria Petersen  Procedure(s) Performed: CATARACT EXTRACTION PHACO AND INTRAOCULAR LENS PLACEMENT (IOC) LEFT 4.51 00:56.4 (Left Eye)     Patient location during evaluation: PACU Anesthesia Type: MAC Level of consciousness: awake Pain management: pain level controlled Vital Signs Assessment: post-procedure vital signs reviewed and stable Respiratory status: respiratory function stable Cardiovascular status: stable Postop Assessment: no apparent nausea or vomiting Anesthetic complications: no   No complications documented.  Veda Canning

## 2019-07-02 NOTE — Transfer of Care (Signed)
Immediate Anesthesia Transfer of Care Note  Patient: Alexandria Petersen  Procedure(s) Performed: CATARACT EXTRACTION PHACO AND INTRAOCULAR LENS PLACEMENT (IOC) LEFT 4.51 00:56.4 (Left Eye)  Patient Location: PACU  Anesthesia Type: MAC  Level of Consciousness: awake, alert  and patient cooperative  Airway and Oxygen Therapy: Patient Spontanous Breathing and Patient connected to supplemental oxygen  Post-op Assessment: Post-op Vital signs reviewed, Patient's Cardiovascular Status Stable, Respiratory Function Stable, Patent Airway and No signs of Nausea or vomiting  Post-op Vital Signs: Reviewed and stable  Complications: No complications documented.

## 2019-07-02 NOTE — Anesthesia Procedure Notes (Signed)
Procedure Name: MAC Date/Time: 07/02/2019 8:01 AM Performed by: Cameron Ali, CRNA Pre-anesthesia Checklist: Patient identified, Emergency Drugs available, Suction available, Timeout performed and Patient being monitored Patient Re-evaluated:Patient Re-evaluated prior to induction Oxygen Delivery Method: Nasal cannula Placement Confirmation: positive ETCO2

## 2019-07-02 NOTE — Anesthesia Preprocedure Evaluation (Addendum)
Anesthesia Evaluation  Patient identified by MRN, date of birth, ID band Patient awake    Reviewed: Allergy & Precautions, NPO status   Airway Mallampati: II  TM Distance: >3 FB     Dental   Pulmonary neg pulmonary ROS,    breath sounds clear to auscultation       Cardiovascular hypertension,  Rhythm:Regular Rate:Normal  HLD   Neuro/Psych    GI/Hepatic   Endo/Other  Hypothyroidism   Renal/GU      Musculoskeletal  (+) Arthritis , Osteoporosis   Abdominal   Peds  Hematology   Anesthesia Other Findings   Reproductive/Obstetrics                             Anesthesia Physical Anesthesia Plan  ASA: III  Anesthesia Plan: MAC   Post-op Pain Management:    Induction: Intravenous  PONV Risk Score and Plan: TIVA, Midazolam and Treatment may vary due to age or medical condition  Airway Management Planned: Natural Airway and Nasal Cannula  Additional Equipment:   Intra-op Plan:   Post-operative Plan:   Informed Consent: I have reviewed the patients History and Physical, chart, labs and discussed the procedure including the risks, benefits and alternatives for the proposed anesthesia with the patient or authorized representative who has indicated his/her understanding and acceptance.       Plan Discussed with: CRNA  Anesthesia Plan Comments:        Anesthesia Quick Evaluation

## 2019-07-02 NOTE — Op Note (Signed)
OPERATIVE NOTE  Alexandria Petersen [id number] 07/02/2019   PREOPERATIVE DIAGNOSIS:  Nuclear sclerotic cataract left eye. H25.12   POSTOPERATIVE DIAGNOSIS:    Nuclear sclerotic cataract left eye.     PROCEDURE:  Phacoemusification with posterior chamber intraocular lens placement of the left eye  Ultrasound time: Procedure(s): CATARACT EXTRACTION PHACO AND INTRAOCULAR LENS PLACEMENT (IOC) LEFT 4.51 00:56.4 (Left)  LENS:   Implant Name Type Inv. Item Serial No. Manufacturer Lot No. LRB No. Used Action  LENS IOL DIOP 24.0 - [id number] Intraocular Lens LENS IOL DIOP 24.0 [id number] [employer]  Left 1 Implanted      SURGEON:  Wyonia Hough, MD   ANESTHESIA:  Topical with tetracaine drops and 2% Xylocaine jelly, augmented with 1% preservative-free intracameral lidocaine.    COMPLICATIONS:  None.   DESCRIPTION OF PROCEDURE:  The patient was identified in the holding room and transported to the operating room and placed in the supine position under the operating microscope.  The left eye was identified as the operative eye and it was prepped and draped in the usual sterile ophthalmic fashion.   A 1 millimeter clear-corneal paracentesis was made at the 1:30 position.  0.5 ml of preservative-free 1% lidocaine was injected into the anterior chamber.  The anterior chamber was filled with Viscoat viscoelastic.  A 2.4 millimeter keratome was used to make a near-clear corneal incision at the 10:30 position.  .  A curvilinear capsulorrhexis was made with a cystotome and capsulorrhexis forceps.  Balanced salt solution was used to hydrodissect and hydrodelineate the nucleus.   Phacoemulsification was then used in stop and chop fashion to remove the lens nucleus and epinucleus.  The remaining cortex was then removed using the irrigation and aspiration handpiece. Provisc was then placed into the capsular bag to distend it for lens placement.  A lens was then injected into  the capsular bag.  The remaining viscoelastic was aspirated.   Wounds were hydrated with balanced salt solution.  The anterior chamber was inflated to a physiologic pressure with balanced salt solution.  No wound leaks were noted. Cefuroxime 0.1 ml of a 10mg /ml solution was injected into the anterior chamber for a dose of 1 mg of intracameral antibiotic at the completion of the case.   Timolol and Brimonidine drops were applied to the eye.  The patient was taken to the recovery room in stable condition without complications of anesthesia or surgery.  [name] 07/02/2019, 8:18 AM

## 2019-07-02 NOTE — H&P (Signed)

## 2019-07-02 NOTE — Anesthesia Procedure Notes (Deleted)
Procedure Name: MAC Date/Time: 07/02/2019 7:35 AM Performed by: Katharina Jehle, CRNA Pre-anesthesia Checklist: Patient identified, Emergency Drugs available, Suction available, Timeout performed and Patient being monitored Patient Re-evaluated:Patient Re-evaluated prior to induction Oxygen Delivery Method: Nasal cannula Placement Confirmation: positive ETCO2       

## 2019-07-03 ENCOUNTER — Encounter: Payer: Self-pay | Admitting: Ophthalmology

## 2019-08-19 DIAGNOSIS — H2511 Age-related nuclear cataract, right eye: Secondary | ICD-10-CM | POA: Diagnosis not present

## 2019-08-26 ENCOUNTER — Encounter: Payer: Self-pay | Admitting: Ophthalmology

## 2019-08-28 ENCOUNTER — Telehealth: Payer: Self-pay | Admitting: Family Medicine

## 2019-08-28 NOTE — Telephone Encounter (Signed)
Please schedule appointment with Dr. Diona Browner to follow up blood pressure.

## 2019-08-28 NOTE — Telephone Encounter (Signed)
9/28 appointment Pt aware

## 2019-09-01 ENCOUNTER — Other Ambulatory Visit
Admission: RE | Admit: 2019-09-01 | Discharge: 2019-09-01 | Disposition: A | Discharge status: Home / Self Care | Payer: Medicare Other | Source: Ambulatory Visit | Attending: Ophthalmology | Admitting: Ophthalmology

## 2019-09-01 ENCOUNTER — Other Ambulatory Visit: Payer: Self-pay

## 2019-09-01 DIAGNOSIS — Z20822 Contact with and (suspected) exposure to covid-19: Secondary | ICD-10-CM | POA: Diagnosis not present

## 2019-09-01 DIAGNOSIS — Z01812 Encounter for preprocedural laboratory examination: Secondary | ICD-10-CM | POA: Diagnosis not present

## 2019-09-01 LAB — SARS CORONAVIRUS 2 (TAT 6-24 HRS): SARS Coronavirus 2: NEGATIVE

## 2019-09-02 NOTE — Discharge Instructions (Signed)

## 2019-09-03 ENCOUNTER — Other Ambulatory Visit: Payer: Self-pay

## 2019-09-03 ENCOUNTER — Ambulatory Visit
Admission: RE | Admit: 2019-09-03 | Discharge: 2019-09-03 | Disposition: A | Discharge status: Home / Self Care | Payer: Medicare Other | Attending: Ophthalmology | Admitting: Ophthalmology

## 2019-09-03 ENCOUNTER — Ambulatory Visit: Payer: Medicare Other | Admitting: Anesthesiology

## 2019-09-03 ENCOUNTER — Encounter
Admission: RE | Disposition: A | Discharge status: Home / Self Care | Payer: Self-pay | Source: Home / Self Care | Attending: Ophthalmology

## 2019-09-03 ENCOUNTER — Encounter: Payer: Self-pay | Admitting: Ophthalmology

## 2019-09-03 DIAGNOSIS — E7800 Pure hypercholesterolemia, unspecified: Secondary | ICD-10-CM | POA: Diagnosis not present

## 2019-09-03 DIAGNOSIS — H2511 Age-related nuclear cataract, right eye: Secondary | ICD-10-CM | POA: Diagnosis not present

## 2019-09-03 DIAGNOSIS — M1990 Unspecified osteoarthritis, unspecified site: Secondary | ICD-10-CM | POA: Diagnosis not present

## 2019-09-03 DIAGNOSIS — E039 Hypothyroidism, unspecified: Secondary | ICD-10-CM | POA: Diagnosis not present

## 2019-09-03 DIAGNOSIS — M810 Age-related osteoporosis without current pathological fracture: Secondary | ICD-10-CM | POA: Insufficient documentation

## 2019-09-03 DIAGNOSIS — Z880 Allergy status to penicillin: Secondary | ICD-10-CM | POA: Insufficient documentation

## 2019-09-03 DIAGNOSIS — Z888 Allergy status to other drugs, medicaments and biological substances status: Secondary | ICD-10-CM | POA: Insufficient documentation

## 2019-09-03 DIAGNOSIS — E1136 Type 2 diabetes mellitus with diabetic cataract: Secondary | ICD-10-CM | POA: Diagnosis not present

## 2019-09-03 DIAGNOSIS — Z9842 Cataract extraction status, left eye: Secondary | ICD-10-CM | POA: Insufficient documentation

## 2019-09-03 DIAGNOSIS — H25811 Combined forms of age-related cataract, right eye: Secondary | ICD-10-CM | POA: Diagnosis not present

## 2019-09-03 DIAGNOSIS — E785 Hyperlipidemia, unspecified: Secondary | ICD-10-CM | POA: Diagnosis not present

## 2019-09-03 DIAGNOSIS — I10 Essential (primary) hypertension: Secondary | ICD-10-CM | POA: Diagnosis not present

## 2019-09-03 HISTORY — PX: CATARACT EXTRACTION W/PHACO: SHX586

## 2019-09-03 SURGERY — PHACOEMULSIFICATION, CATARACT, WITH IOL INSERTION
Anesthesia: Monitor Anesthesia Care | Site: Eye | Laterality: Right

## 2019-09-03 MED ORDER — LACTATED RINGERS IV SOLN: INTRAVENOUS | Status: DC

## 2019-09-03 MED ORDER — BRIMONIDINE TARTRATE-TIMOLOL 0.2-0.5 % OP SOLN
OPHTHALMIC | Status: DC | PRN
  Administered 2019-09-03: 1 [drp] via OPHTHALMIC

## 2019-09-03 MED ORDER — ARMC OPHTHALMIC DILATING DROPS
1.0000 "application " | OPHTHALMIC | Status: DC | PRN
  Administered 2019-09-03 (×3): 1 via OPHTHALMIC

## 2019-09-03 MED ORDER — MOXIFLOXACIN HCL 0.5 % OP SOLN
1.0000 [drp] | OPHTHALMIC | Status: DC | PRN
  Administered 2019-09-03 (×3): 1 [drp] via OPHTHALMIC

## 2019-09-03 MED ORDER — TETRACAINE HCL 0.5 % OP SOLN
1.0000 [drp] | OPHTHALMIC | Status: DC | PRN
  Administered 2019-09-03 (×3): 1 [drp] via OPHTHALMIC

## 2019-09-03 MED ORDER — MIDAZOLAM HCL 2 MG/2ML IJ SOLN
INTRAMUSCULAR | Status: DC | PRN
  Administered 2019-09-03: 1 mg via INTRAVENOUS

## 2019-09-03 MED ORDER — MOXIFLOXACIN HCL 0.5 % OP SOLN
OPHTHALMIC | Status: DC | PRN
  Administered 2019-09-03: 0.2 mL via OPHTHALMIC

## 2019-09-03 MED ORDER — ACETAMINOPHEN 10 MG/ML IV SOLN: 1000.0000 mg | Freq: Once | INTRAVENOUS | Status: DC | PRN

## 2019-09-03 MED ORDER — FENTANYL CITRATE (PF) 100 MCG/2ML IJ SOLN
INTRAMUSCULAR | Status: DC | PRN
Start: 2019-09-03 — End: 2019-09-03
  Administered 2019-09-03: 50 ug via INTRAVENOUS

## 2019-09-03 MED ORDER — ONDANSETRON HCL 4 MG/2ML IJ SOLN: 4.0000 mg | Freq: Once | INTRAMUSCULAR | Status: DC | PRN

## 2019-09-03 MED ORDER — NA HYALUR & NA CHOND-NA HYALUR 0.4-0.35 ML IO KIT
PACK | INTRAOCULAR | Status: DC | PRN
  Administered 2019-09-03: 1 mL via INTRAOCULAR

## 2019-09-03 MED ORDER — LIDOCAINE HCL (PF) 2 % IJ SOLN
INTRAOCULAR | Status: DC | PRN
  Administered 2019-09-03: 2 mL

## 2019-09-03 MED ORDER — EPINEPHRINE PF 1 MG/ML IJ SOLN
INTRAOCULAR | Status: DC | PRN
  Administered 2019-09-03: 71 mL via OPHTHALMIC

## 2019-09-03 SURGICAL SUPPLY — 23 items
CANNULA ANT/CHMB 27G (MISCELLANEOUS) ×1 IMPLANT
CANNULA ANT/CHMB 27GA (MISCELLANEOUS) ×3 IMPLANT
GLOVE SURG LX 7.5 STRW (GLOVE) ×2
GLOVE SURG LX STRL 7.5 STRW (GLOVE) ×1 IMPLANT
GLOVE SURG TRIUMPH 8.0 PF LTX (GLOVE) ×3 IMPLANT
GOWN STRL REUS W/ TWL LRG LVL3 (GOWN DISPOSABLE) ×2 IMPLANT
GOWN STRL REUS W/TWL LRG LVL3 (GOWN DISPOSABLE) ×6
LENS IOL DIOP 24.5 (Intraocular Lens) ×3 IMPLANT
LENS IOL TECNIS MONO 24.5 (Intraocular Lens) IMPLANT
MARKER SKIN DUAL TIP RULER LAB (MISCELLANEOUS) ×3 IMPLANT
NDL CAPSULORHEX 25GA (NEEDLE) ×1 IMPLANT
NDL FILTER BLUNT 18X1 1/2 (NEEDLE) ×2 IMPLANT
NEEDLE CAPSULORHEX 25GA (NEEDLE) ×3 IMPLANT
NEEDLE FILTER BLUNT 18X 1/2SAF (NEEDLE) ×4
NEEDLE FILTER BLUNT 18X1 1/2 (NEEDLE) ×2 IMPLANT
PACK CATARACT BRASINGTON (MISCELLANEOUS) ×3 IMPLANT
PACK EYE AFTER SURG (MISCELLANEOUS) ×3 IMPLANT
PACK OPTHALMIC (MISCELLANEOUS) ×3 IMPLANT
SOLUTION OPHTHALMIC SALT (MISCELLANEOUS) ×3 IMPLANT
SYR 3ML LL SCALE MARK (SYRINGE) ×6 IMPLANT
SYR TB 1ML LUER SLIP (SYRINGE) ×3 IMPLANT
WATER STERILE IRR 250ML POUR (IV SOLUTION) ×3 IMPLANT
WIPE NON LINTING 3.25X3.25 (MISCELLANEOUS) ×3 IMPLANT

## 2019-09-03 NOTE — Transfer of Care (Signed)
Immediate Anesthesia Transfer of Care Note  Patient: Alexandria Petersen  Procedure(s) Performed: CATARACT EXTRACTION PHACO AND INTRAOCULAR LENS PLACEMENT (IOC) RIGHT DIABETIC 9.40 01:13.6 12.8 % (Right Eye)  Patient Location: PACU  Anesthesia Type: MAC  Level of Consciousness: awake, alert  and patient cooperative  Airway and Oxygen Therapy: Patient Spontanous Breathing   Post-op Assessment: Post-op Vital signs reviewed, Patient's Cardiovascular Status Stable, Respiratory Function Stable, Patent Airway and No signs of Nausea or vomiting  Post-op Vital Signs: Reviewed and stable  Complications: No complications documented.

## 2019-09-03 NOTE — Anesthesia Postprocedure Evaluation (Signed)
Anesthesia Post Note  Patient: Alexandria Petersen  Procedure(s) Performed: CATARACT EXTRACTION PHACO AND INTRAOCULAR LENS PLACEMENT (IOC) RIGHT DIABETIC 9.40 01:13.6 12.8 % (Right Eye)     Patient location during evaluation: PACU Anesthesia Type: MAC Level of consciousness: awake and alert Pain management: pain level controlled Vital Signs Assessment: post-procedure vital signs reviewed and stable Respiratory status: spontaneous breathing, nonlabored ventilation, respiratory function stable and patient connected to nasal cannula oxygen Cardiovascular status: stable and blood pressure returned to baseline Postop Assessment: no apparent nausea or vomiting Anesthetic complications: no   No complications documented.  Baudelio Karnes A  Floretta Petro

## 2019-09-03 NOTE — Op Note (Signed)
LOCATION:  [place]   PREOPERATIVE DIAGNOSIS:    Nuclear sclerotic cataract right eye. H25.11   POSTOPERATIVE DIAGNOSIS:  Nuclear sclerotic cataract right eye.     PROCEDURE:  Phacoemusification with posterior chamber intraocular lens placement of the right eye   ULTRASOUND TIME: Procedure(s): CATARACT EXTRACTION PHACO AND INTRAOCULAR LENS PLACEMENT (IOC) RIGHT DIABETIC 9.40 01:13.6 12.8 % (Right)  LENS:   Implant Name Type Inv. Item Serial No. Manufacturer Lot No. LRB No. Used Action  LENS IOL DIOP 24.5 - [id number] Intraocular Lens LENS IOL DIOP 24.5 [id number] [employer]  Right 1 Implanted         SURGEON:  Wyonia Hough, MD   ANESTHESIA:  Topical with tetracaine drops and 2% Xylocaine jelly, augmented with 1% preservative-free intracameral lidocaine.    COMPLICATIONS:  None.   DESCRIPTION OF PROCEDURE:  The patient was identified in the holding room and transported to the operating room and placed in the supine position under the operating microscope.  The right eye was identified as the operative eye and it was prepped and draped in the usual sterile ophthalmic fashion.   A 1 millimeter clear-corneal paracentesis was made at the 12:00 position.  0.5 ml of preservative-free 1% lidocaine was injected into the anterior chamber. The anterior chamber was filled with Viscoat viscoelastic.  A 2.4 millimeter keratome was used to make a near-clear corneal incision at the 9:00 position.  A curvilinear capsulorrhexis was made with a cystotome and capsulorrhexis forceps.  Balanced salt solution was used to hydrodissect and hydrodelineate the nucleus.   Phacoemulsification was then used in stop and chop fashion to remove the lens nucleus and epinucleus.  The remaining cortex was then removed using the irrigation and aspiration handpiece. Provisc was then placed into the capsular bag to distend it for lens placement.  A lens was then injected into the  capsular bag.  The remaining viscoelastic was aspirated.   Wounds were hydrated with balanced salt solution.  The anterior chamber was inflated to a physiologic pressure with balanced salt solution.  No wound leaks were noted. Vigamox 0.2 ml of a 1mg  per ml solution was injected into the anterior chamber for a dose of 0.2 mg of intracameral antibiotic at the completion of the case.   Timolol and Brimonidine drops were applied to the eye.  The patient was taken to the recovery room in stable condition without complications of anesthesia or surgery.   Alexandria Petersen 09/03/2019, 8:02 AM

## 2019-09-03 NOTE — H&P (Signed)

## 2019-09-03 NOTE — Anesthesia Procedure Notes (Signed)
Procedure Name: MAC Date/Time: 09/03/2019 7:44 AM Performed by: Vanetta Shawl, CRNA Pre-anesthesia Checklist: Patient identified, Emergency Drugs available, Suction available, Timeout performed and Patient being monitored Patient Re-evaluated:Patient Re-evaluated prior to induction Oxygen Delivery Method: Nasal cannula Placement Confirmation: positive ETCO2

## 2019-09-03 NOTE — Anesthesia Preprocedure Evaluation (Signed)
Anesthesia Evaluation  Patient identified by MRN, date of birth, ID band Patient awake    Reviewed: Allergy & Precautions, NPO status , Patient's Chart, lab work & pertinent test results, reviewed documented beta blocker date and time   History of Anesthesia Complications Negative for: history of anesthetic complications  Airway Mallampati: III  TM Distance: >3 FB     Dental  (+) Upper Dentures, Edentulous Lower   Pulmonary    breath sounds clear to auscultation       Cardiovascular hypertension, (-) angina(-) DOE  Rhythm:Regular Rate:Normal  HLD   Neuro/Psych    GI/Hepatic neg GERD  ,  Endo/Other  Hypothyroidism   Renal/GU      Musculoskeletal  (+) Arthritis , Osteoporosis   Abdominal   Peds  Hematology   Anesthesia Other Findings   Reproductive/Obstetrics                             Anesthesia Physical  Anesthesia Plan  ASA: III  Anesthesia Plan: MAC   Post-op Pain Management:    Induction: Intravenous  PONV Risk Score and Plan: 2 and TIVA, Midazolam and Treatment may vary due to age or medical condition  Airway Management Planned: Natural Airway and Nasal Cannula  Additional Equipment:   Intra-op Plan:   Post-operative Plan:   Informed Consent: I have reviewed the patients History and Physical, chart, labs and discussed the procedure including the risks, benefits and alternatives for the proposed anesthesia with the patient or authorized representative who has indicated his/her understanding and acceptance.       Plan Discussed with: CRNA  Anesthesia Plan Comments:         Anesthesia Quick Evaluation

## 2019-09-04 ENCOUNTER — Other Ambulatory Visit: Payer: Self-pay | Admitting: Family Medicine

## 2019-09-04 ENCOUNTER — Encounter: Payer: Self-pay | Admitting: Ophthalmology

## 2019-09-18 ENCOUNTER — Ambulatory Visit (INDEPENDENT_AMBULATORY_CARE_PROVIDER_SITE_OTHER): Payer: Medicare Other | Admitting: Endocrinology

## 2019-09-18 ENCOUNTER — Other Ambulatory Visit: Payer: Self-pay

## 2019-09-18 ENCOUNTER — Encounter: Payer: Self-pay | Admitting: Endocrinology

## 2019-09-18 DIAGNOSIS — E890 Postprocedural hypothyroidism: Secondary | ICD-10-CM

## 2019-09-18 LAB — TSH: TSH: 2.46 u[IU]/mL (ref 0.35–4.50)

## 2019-09-18 LAB — T4, FREE: Free T4: 1.06 ng/dL (ref 0.60–1.60)

## 2019-09-18 NOTE — Progress Notes (Signed)
Patient ID: Alexandria Petersen, female   DOB: 08-Jun-1940, 79 y.o.   MRN: [id number]                                                                                                               Reason for Appointment: Hypothyroidism, follow up     History of Present Illness:   Prior history: When she was first seen in 12/16 she had symptoms of weight loss,  palpitations, feeling tired, occasionally feeling warm and some weakness in her legs. Also was having decreased appetite.  She previously had suppressed TSH levels since at least 10/2013 Pretreatment labs showed increased free T4 and free T3 levels in 12/16  She was also found to have a goiter on exam initially.  Was started on Methimazole 5 mg twice a day in 12/2014   Her methimazole was tapered off and stopped in 09/2015 when her free T4 was low normal However without her methimazole she had a recurrence of her hyperthyroidism in 1/18; she was having symptoms of palpitations and fatigue She finally had her I-131 treatment on 05/02/16 with 18.1 mCi   RECENT history:  She had become hypothyroid in 07/2016 when she was having fatigue, increased weight and hoarseness She has been started on levothyroxine, initially 112 g and then this was reduced gradually  She has been on 75 mcg since 2019  However because of her TSH being 8.1 in 5/20 she was told to go up on her dose  She has been taking 75 mcg of levothyroxine along with 1-1/2 tablets on Sundays giving her an average of 80 mcg daily  Her dose was continued unchanged in 9/20  She feels fairly good again No shakiness or palpitations Weight is about the same She has no cold intolerance   She has been regular with taking her levothyroxine about an hour before breakfast daily She has been regular with her supplement   Wt Readings from Last 3 Encounters:  09/18/19 159 lb 12.8 oz (72.5 kg)  09/03/19 151 lb (68.5 kg)  07/02/19 158 lb (71.7 kg)   Lab Results   Component Value Date   TSH 1.40 03/18/2019   TSH 1.38 09/17/2018   TSH 8.11 (H) 06/05/2018   FREET4 1.52 03/18/2019   FREET4 1.14 09/17/2018   FREET4 1.07 06/05/2018       Allergies as of 09/18/2019      Reactions   Penicillins    REACTION: hallucinations      Medication List       Accurate as of September 18, 2019  1:14 PM. If you have any questions, ask your nurse or doctor.        acetaminophen 650 MG CR tablet Commonly known as: TYLENOL Take 650 mg by mouth every 8 (eight) hours as needed for pain.   CENTRUM SILVER ADULT 50+ PO Take 1 tablet by mouth daily.   Culturelle Caps   hydrochlorothiazide 12.5 MG capsule Commonly known as: MICROZIDE TAKE 1 CAPSULE BY MOUTH EVERY DAY   Klor-Con M20  20 MEQ tablet Generic drug: potassium chloride SA   levothyroxine 75 MCG tablet Commonly known as: SYNTHROID TAKE 1 TABLET BY MOUTH EVERY DAY EXCEPT ON SUNDAY ONLY TAKE 1&1/2 TABS   losartan 100 MG tablet Commonly known as: COZAAR TAKE 1 TABLET BY MOUTH EVERY DAY   pravastatin 40 MG tablet Commonly known as: PRAVACHOL TAKE 1 TABLET BY MOUTH EVERY DAY           Past Medical History:  Diagnosis Date  . Arthritis    knee  . HLD (hyperlipidemia)   . Hypertension   . Hypothyroidism   . Osteoporosis   . Wears dentures    Full upper and lower.  Only wears upper.    Past Surgical History:  Procedure Laterality Date  . BREAST BIOPSY Left   . CATARACT EXTRACTION W/PHACO Left 07/02/2019   Procedure: CATARACT EXTRACTION PHACO AND INTRAOCULAR LENS PLACEMENT (IOC) LEFT 4.51 00:56.4;  Surgeon: Leandrew Koyanagi, MD;  Location: [facility];  Service: Ophthalmology;  Laterality: Left;  . CATARACT EXTRACTION W/PHACO Right 09/03/2019   Procedure: CATARACT EXTRACTION PHACO AND INTRAOCULAR LENS PLACEMENT (IOC) RIGHT DIABETIC 9.40 01:13.6 12.8 %;  Surgeon: Leandrew Koyanagi, MD;  Location: [facility];  Service: Ophthalmology;  Laterality: Right;  .  HEMORROIDECTOMY     ?  . ROTATOR CUFF REPAIR Right   . TUBAL LIGATION      Family History  Problem Relation Age of Onset  . Hypertension Mother   . Diabetes Mother   . Hypertension Father   . Diabetes Sister   . Diabetes Brother   . Hypothyroidism Daughter   . Heart disease Son   . Hypothyroidism Son     Social History:  reports that she has never smoked. She has never used smokeless tobacco. She reports that she does not drink alcohol and does not use drugs.  Allergies:  Allergies  Allergen Reactions  . Penicillins     REACTION: hallucinations     Review of Systems  Blood pressure is being treated with losartan and HCTZ  Hypertension has been managed by her PCP She is followed by her PCP Again reportedly her blood pressure at home is about 654Y systolic   She usually has significant whitecoat hypertension  BP Readings from Last 3 Encounters:  09/18/19 (!) 170/60  09/03/19 (!) 155/63  07/02/19 (!) 159/81    She has impaired fasting glucose followed by her PCP   Examination:   BP (!) 170/60 (BP Location: Right Arm, Patient Position: Sitting, Cuff Size: Normal)   Pulse (!) 110   Ht 5\' 4"  (1.626 m)   Wt 159 lb 12.8 oz (72.5 kg)   SpO2 99%   BMI 27.43 kg/m   No edema Biceps reflexes appear normal No tremor   Assessment/Plan:  Post-ablative hypothyroidism since 07/2016  She is taking the equivalent of 80 mcg of levothyroxine since 2020 Her TSH was normal on the last visit She has been feeling fairly good and looks euthyroid  She has not missed any doses of levothyroxine  Labs will be rechecked today to decide on her doses She will try to get her labs done ahead of time to her PCP office on the next visit  HYPERTENSION: She has whitecoat syndrome and discussed needing to take her blood pressure monitor to her MD office to compare  There are no Patient Instructions on file for this visit.     Elayne Snare 09/18/2019, 1:14 PM   Note: This office  note was  prepared with Estate agent. Any transcriptional errors that result from this process are unintentional.

## 2019-09-23 NOTE — Progress Notes (Signed)
Please call to let patient know that the lab results are normal and to stay on the same dose of levothyroxine

## 2019-10-07 ENCOUNTER — Other Ambulatory Visit: Payer: Self-pay

## 2019-10-07 ENCOUNTER — Ambulatory Visit (INDEPENDENT_AMBULATORY_CARE_PROVIDER_SITE_OTHER): Payer: Medicare Other | Admitting: Family Medicine

## 2019-10-07 ENCOUNTER — Encounter: Payer: Self-pay | Admitting: Family Medicine

## 2019-10-07 VITALS — BP 160/64 | HR 93 | Temp 98.0°F | Ht 62.0 in | Wt 161.0 lb

## 2019-10-07 DIAGNOSIS — E876 Hypokalemia: Secondary | ICD-10-CM | POA: Diagnosis not present

## 2019-10-07 DIAGNOSIS — I10 Essential (primary) hypertension: Secondary | ICD-10-CM | POA: Diagnosis not present

## 2019-10-07 DIAGNOSIS — Z23 Encounter for immunization: Secondary | ICD-10-CM | POA: Diagnosis not present

## 2019-10-07 LAB — BASIC METABOLIC PANEL
BUN: 17 mg/dL (ref 6–23)
CO2: 30 mEq/L (ref 19–32)
Calcium: 10 mg/dL (ref 8.4–10.5)
Chloride: 100 mEq/L (ref 96–112)
Creatinine, Ser: 1 mg/dL (ref 0.40–1.20)
GFR: 64.41 mL/min (ref >60.00)
Glucose, Bld: 123 mg/dL — ABNORMAL HIGH (ref 70–99)
Potassium: 3.4 mEq/L — ABNORMAL LOW (ref 3.5–5.1)
Sodium: 138 mEq/L (ref 135–145)

## 2019-10-07 NOTE — Patient Instructions (Addendum)
Please stop at the lab to have labs drawn.  Keep working on walking.

## 2019-10-07 NOTE — Progress Notes (Signed)
[id number]

## 2019-10-07 NOTE — Progress Notes (Signed)
Chief Complaint  Patient presents with  . Hypertension    History of Present Illness: HPI   79 year old female presents for follow up HTN  Hypertension:   Elevated in office today.. hx of white coat HTN On losartan and HCTZ   05/2019 K was normal ( in past HCTZ lowered K) BP Readings from Last 3 Encounters:  10/07/19 (!) 160/64  09/18/19 (!) 170/60  09/03/19 (!) 155/63  Using medication without problems or lightheadedness: none Chest pain with exertion:none Edema:none Short of breath:none Average home BPs:122-136/62-73 HR 60 Other issues:  Working on walking.. using cane   This visit occurred during the SARS-CoV-2 public health emergency.  Safety protocols were in place, including screening questions prior to the visit, additional usage of staff PPE, and extensive cleaning of exam room while observing appropriate contact time as indicated for disinfecting solutions.   COVID 19 screen:  No recent travel or known exposure to COVID19 The patient denies respiratory symptoms of COVID 19 at this time. The importance of social distancing was discussed today.     Review of Systems  Constitutional: Negative for chills and fever.  HENT: Negative for congestion and ear pain.   Eyes: Negative for pain and redness.  Respiratory: Negative for cough and shortness of breath.   Cardiovascular: Negative for chest pain, palpitations and leg swelling.  Gastrointestinal: Negative for abdominal pain, blood in stool, constipation, diarrhea, nausea and vomiting.  Genitourinary: Negative for dysuria.  Musculoskeletal: Negative for falls and myalgias.  Skin: Negative for rash.  Neurological: Negative for dizziness.  Psychiatric/Behavioral: Negative for depression. The patient is not nervous/anxious.       Past Medical History:  Diagnosis Date  . Arthritis    knee  . HLD (hyperlipidemia)   . Hypertension   . Hypothyroidism   . Osteoporosis   . Wears dentures    Full upper and lower.   Only wears upper.    reports that she has never smoked. She has never used smokeless tobacco. She reports that she does not drink alcohol and does not use drugs.   Current Outpatient Medications:  .  acetaminophen (TYLENOL) 650 MG CR tablet, Take 650 mg by mouth every 8 (eight) hours as needed for pain., Disp: , Rfl:  .  hydrochlorothiazide (MICROZIDE) 12.5 MG capsule, TAKE 1 CAPSULE BY MOUTH EVERY DAY, Disp: 90 capsule, Rfl: 0 .  KLOR-CON M20 20 MEQ tablet, , Disp: , Rfl:  .  Lactobacillus Rhamnosus, GG, (CULTURELLE) CAPS, , Disp: , Rfl:  .  levothyroxine (SYNTHROID) 75 MCG tablet, TAKE 1 TABLET BY MOUTH EVERY DAY EXCEPT ON SUNDAY ONLY TAKE 1&1/2 TABS, Disp: 96 tablet, Rfl: 1 .  losartan (COZAAR) 100 MG tablet, TAKE 1 TABLET BY MOUTH EVERY DAY, Disp: 90 tablet, Rfl: 1 .  Multiple Vitamins-Minerals (CENTRUM SILVER ADULT 50+ PO), Take 1 tablet by mouth daily., Disp: , Rfl:  .  pravastatin (PRAVACHOL) 40 MG tablet, TAKE 1 TABLET BY MOUTH EVERY DAY, Disp: 90 tablet, Rfl: 3   Observations/Objective: Blood pressure (!) 160/64, pulse 93, temperature 98 F (36.7 C), temperature source Temporal, height 5\' 2"  (1.575 m), weight 161 lb (73 kg), SpO2 98 %.  Physical Exam Constitutional:      General: She is not in acute distress.    Appearance: Normal appearance. She is well-developed. She is not ill-appearing or toxic-appearing.  HENT:     Head: Normocephalic.     Right Ear: Hearing, tympanic membrane, ear canal and external ear normal. Tympanic  membrane is not erythematous, retracted or bulging.     Left Ear: Hearing, tympanic membrane, ear canal and external ear normal. Tympanic membrane is not erythematous, retracted or bulging.     Nose: No mucosal edema or rhinorrhea.     Right Sinus: No maxillary sinus tenderness or frontal sinus tenderness.     Left Sinus: No maxillary sinus tenderness or frontal sinus tenderness.     Mouth/Throat:     Pharynx: Uvula midline.  Eyes:     General: Lids are  normal. Lids are everted, no foreign bodies appreciated.     Conjunctiva/sclera: Conjunctivae normal.     Pupils: Pupils are equal, round, and reactive to light.  Neck:     Thyroid: No thyroid mass or thyromegaly.     Vascular: No carotid bruit.     Trachea: Trachea normal.  Cardiovascular:     Rate and Rhythm: Normal rate and regular rhythm.     Pulses: Normal pulses.     Heart sounds: Normal heart sounds, S1 normal and S2 normal. No murmur heard.  No friction rub. No gallop.   Pulmonary:     Effort: Pulmonary effort is normal. No tachypnea or respiratory distress.     Breath sounds: Normal breath sounds. No decreased breath sounds, wheezing, rhonchi or rales.  Abdominal:     General: Bowel sounds are normal.     Palpations: Abdomen is soft.     Tenderness: There is no abdominal tenderness.  Musculoskeletal:     Cervical back: Normal range of motion and neck supple.  Skin:    General: Skin is warm and dry.     Findings: No rash.  Neurological:     Mental Status: She is alert.  Psychiatric:        Mood and Affect: Mood is not anxious or depressed.        Speech: Speech normal.        Behavior: Behavior normal. Behavior is cooperative.        Thought Content: Thought content normal.        Judgment: Judgment normal.      Assessment and Plan   HYPERTENSION, BENIGN ESSENTIAL Good control at home on current regimen. Eval K given low K with HCTZ in past.     Eliezer Lofts, MD

## 2019-10-07 NOTE — Assessment & Plan Note (Signed)
Good control at home on current regimen. Eval K given low K with HCTZ in past.

## 2019-11-28 ENCOUNTER — Other Ambulatory Visit: Payer: Self-pay | Admitting: Family Medicine

## 2019-11-29 ENCOUNTER — Other Ambulatory Visit: Payer: Self-pay | Admitting: Endocrinology

## 2020-01-19 ENCOUNTER — Telehealth: Payer: Self-pay | Admitting: Family Medicine

## 2020-01-19 DIAGNOSIS — E7800 Pure hypercholesterolemia, unspecified: Secondary | ICD-10-CM

## 2020-01-19 DIAGNOSIS — R7303 Prediabetes: Secondary | ICD-10-CM

## 2020-01-19 NOTE — Telephone Encounter (Signed)
-----   Message from Cloyd Stagers, RT sent at 01/19/2020  3:21 PM EST ----- Regarding: Lab Orders for Wednesday 1.26.2022 Please place lab orders for Wednesday 1.26.2022, office visit for physical on Tuesday 2.1.2022 Thank you, Dyke Maes RT(R)

## 2020-02-03 ENCOUNTER — Other Ambulatory Visit: Payer: Self-pay | Admitting: Family Medicine

## 2020-02-03 DIAGNOSIS — Z1231 Encounter for screening mammogram for malignant neoplasm of breast: Secondary | ICD-10-CM

## 2020-02-04 ENCOUNTER — Ambulatory Visit (INDEPENDENT_AMBULATORY_CARE_PROVIDER_SITE_OTHER): Payer: Medicare Other

## 2020-02-04 ENCOUNTER — Other Ambulatory Visit (INDEPENDENT_AMBULATORY_CARE_PROVIDER_SITE_OTHER): Payer: Medicare Other

## 2020-02-04 ENCOUNTER — Other Ambulatory Visit: Payer: Self-pay

## 2020-02-04 DIAGNOSIS — Z0000 Encounter for general adult medical examination without abnormal findings: Secondary | ICD-10-CM | POA: Diagnosis not present

## 2020-02-04 DIAGNOSIS — E7800 Pure hypercholesterolemia, unspecified: Secondary | ICD-10-CM

## 2020-02-04 DIAGNOSIS — R7303 Prediabetes: Secondary | ICD-10-CM | POA: Diagnosis not present

## 2020-02-04 DIAGNOSIS — E890 Postprocedural hypothyroidism: Secondary | ICD-10-CM | POA: Diagnosis not present

## 2020-02-04 LAB — COMPREHENSIVE METABOLIC PANEL
ALT: 16 U/L (ref 0–35)
AST: 19 U/L (ref 0–37)
Albumin: 4.3 g/dL (ref 3.5–5.2)
Alkaline Phosphatase: 46 U/L (ref 39–117)
BUN: 19 mg/dL (ref 6–23)
CO2: 29 mEq/L (ref 19–32)
Calcium: 10.2 mg/dL (ref 8.4–10.5)
Chloride: 103 mEq/L (ref 96–112)
Creatinine, Ser: 0.95 mg/dL (ref 0.40–1.20)
GFR: 56.31 mL/min — ABNORMAL LOW (ref >60.00)
Glucose, Bld: 107 mg/dL — ABNORMAL HIGH (ref 70–99)
Potassium: 3.7 mEq/L (ref 3.5–5.1)
Sodium: 141 mEq/L (ref 135–145)
Total Bilirubin: 1.1 mg/dL (ref 0.2–1.2)
Total Protein: 7.4 g/dL (ref 6.0–8.3)

## 2020-02-04 LAB — LIPID PANEL
Cholesterol: 220 mg/dL — ABNORMAL HIGH (ref 0–200)
HDL: 98.4 mg/dL (ref >39.00)
LDL Cholesterol: 104 mg/dL — ABNORMAL HIGH (ref 0–99)
NonHDL: 121.56
Total CHOL/HDL Ratio: 2
Triglycerides: 90 mg/dL (ref 0.0–149.0)
VLDL: 18 mg/dL (ref 0.0–40.0)

## 2020-02-04 LAB — TSH: TSH: 3.85 u[IU]/mL (ref 0.35–4.50)

## 2020-02-04 LAB — HEMOGLOBIN A1C: Hgb A1c MFr Bld: 5.9 % (ref 4.6–6.5)

## 2020-02-04 LAB — T4, FREE: Free T4: 1.14 ng/dL (ref 0.60–1.60)

## 2020-02-04 NOTE — Progress Notes (Signed)
Subjective:   Alexandria Petersen is a 80 y.o. female who presents for Medicare Annual (Subsequent) preventive examination.  Review of Systems: N/A      I connected with the patient today by telephone and verified that I am speaking with the correct person using two identifiers. Location patient: home Location nurse: work Persons participating in the telephone visit: patient, nurse.   I discussed the limitations, risks, security and privacy concerns of performing an evaluation and management service by telephone and the availability of in person appointments. I also discussed with the patient that there may be a patient responsible charge related to this service. The patient expressed understanding and verbally consented to this telephonic visit.        Cardiac Risk Factors include: advanced age (>32men, >22 women);hypertension;Other (see comment), Risk factor comments: hypercholesterolemia     Objective:    Today's Vitals   There is no height or weight on file to calculate BMI.  Advanced Directives 02/04/2020 09/03/2019 07/02/2019 02/24/2019 02/03/2019 01/28/2018 12/25/2016  Does Patient Have a Medical Advance Directive? No Yes Yes No No No No  Type of Advance Directive - [place];Living will [place];Living will - - - -  Does patient want to make changes to medical advance directive? - No - Patient declined No - Patient declined - - - -  Copy of [place] in Chart? - No - copy requested No - copy requested - - - -  Would patient like information on creating a medical advance directive? No - Patient declined - - - Yes (MAU/Ambulatory/Procedural Areas - Information given) No - Patient declined No - Patient declined    Current Medications (verified) Outpatient Encounter Medications as of 02/04/2020  Medication Sig  . acetaminophen (TYLENOL) 650 MG CR tablet Take 650 mg by mouth every 8 (eight) hours as needed for pain.   . hydrochlorothiazide (MICROZIDE) 12.5 MG capsule TAKE 1 CAPSULE BY MOUTH EVERY DAY  . KLOR-CON M20 20 MEQ tablet   . Lactobacillus Rhamnosus, GG, (CULTURELLE) CAPS   . levothyroxine (SYNTHROID) 75 MCG tablet TAKE 1 TABLET BY MOUTH EVERY DAY EXCEPT ON SUNDAY ONLY TAKE 1&1/2 TABS  . losartan (COZAAR) 100 MG tablet TAKE 1 TABLET BY MOUTH EVERY DAY  . Multiple Vitamins-Minerals (CENTRUM SILVER ADULT 50+ PO) Take 1 tablet by mouth daily.  . pravastatin (PRAVACHOL) 40 MG tablet TAKE 1 TABLET BY MOUTH EVERY DAY   No facility-administered encounter medications on file as of 02/04/2020.    Allergies (verified) Penicillins   History: Past Medical History:  Diagnosis Date  . Arthritis    knee  . HLD (hyperlipidemia)   . Hypertension   . Hypothyroidism   . Osteoporosis   . Wears dentures    Full upper and lower.  Only wears upper.   Past Surgical History:  Procedure Laterality Date  . BREAST BIOPSY Left   . CATARACT EXTRACTION W/PHACO Left 07/02/2019   Procedure: CATARACT EXTRACTION PHACO AND INTRAOCULAR LENS PLACEMENT (IOC) LEFT 4.51 00:56.4;  Surgeon: Leandrew Koyanagi, MD;  Location: [facility];  Service: Ophthalmology;  Laterality: Left;  . CATARACT EXTRACTION W/PHACO Right 09/03/2019   Procedure: CATARACT EXTRACTION PHACO AND INTRAOCULAR LENS PLACEMENT (IOC) RIGHT DIABETIC 9.40 01:13.6 12.8 %;  Surgeon: Leandrew Koyanagi, MD;  Location: [facility];  Service: Ophthalmology;  Laterality: Right;  . HEMORROIDECTOMY     ?  . INTRAOCULAR LENS INSERTION Bilateral   . ROTATOR CUFF REPAIR Right   .  TUBAL LIGATION     Family History  Problem Relation Age of Onset  . Hypertension Mother   . Diabetes Mother   . Hypertension Father   . Diabetes Sister   . Diabetes Brother   . Hypothyroidism Daughter   . Heart disease Son   . Hypothyroidism Son    Social History   Socioeconomic History  . Marital status: Widowed    Spouse name: Not on file  . Number of  children: 3  . Years of education: Not on file  . Highest education level: Not on file  Occupational History  . Occupation: retired day Counselling psychologist: RETIRED  Tobacco Use  . Smoking status: Never Smoker  . Smokeless tobacco: Never Used  Vaping Use  . Vaping Use: Never used  Substance and Sexual Activity  . Alcohol use: No  . Drug use: No  . Sexual activity: Never  Other Topics Concern  . Not on file  Social History Narrative   Regular exercise--minimal   She is working on setting up end of life planning.   No living will. Full code (reviewed 2013)               Social Determinants of Health   Financial Resource Strain: Low Risk   . Difficulty of Paying Living Expenses: Not hard at all  Food Insecurity: No Food Insecurity  . Worried About Charity fundraiser in the Last Year: Never true  . Ran Out of Food in the Last Year: Never true  Transportation Needs: No Transportation Needs  . Lack of Transportation (Medical): No  . Lack of Transportation (Non-Medical): No  Physical Activity: Insufficiently Active  . Days of Exercise per Week: 7 days  . Minutes of Exercise per Session: 10 min  Stress: No Stress Concern Present  . Feeling of Stress : Not at all  Social Connections: Not on file    Tobacco Counseling Counseling given: Not Answered   Clinical Intake:  Pre-visit preparation completed: Yes  Pain : No/denies pain     Nutritional Risks: None Diabetes: No  How often do you need to have someone help you when you read instructions, pamphlets, or other written materials from your doctor or pharmacy?: 1 - Never What is the last grade level you completed in school?: 12th  Diabetic: No Nutrition Risk Assessment:  Has the patient had any N/V/D within the last 2 months?  No  Does the patient have any non-healing wounds?  No  Has the patient had any unintentional weight loss or weight gain?  No   Diabetes:  Is the patient diabetic?  No  If  diabetic, was a CBG obtained today?  N/A Did the patient bring in their glucometer from home?  N/A How often do you monitor your CBG's? N/A.   Financial Strains and Diabetes Management:  Are you having any financial strains with the device, your supplies or your medication? N/A.  Does the patient want to be seen by Chronic Care Management for management of their diabetes?  N/A Would the patient like to be referred to a Nutritionist or for Diabetic Management?  N/A  Interpreter Needed?: No  Information entered by :: CJohnson, LPN   Activities of Daily Living In your present state of health, do you have any difficulty performing the following activities: 02/04/2020 09/03/2019  Hearing? N N  Vision? N N  Difficulty concentrating or making decisions? N N  Walking or climbing stairs? N N  Dressing or bathing? N N  Doing errands, shopping? N -  Preparing Food and eating ? N -  Using the Toilet? N -  In the past six months, have you accidently leaked urine? N -  Do you have problems with loss of bowel control? N -  Managing your Medications? N -  Managing your Finances? N -  Housekeeping or managing your Housekeeping? N -  Some recent data might be hidden    Patient Care Team: Jinny Sanders, MD as PCP - General  Indicate any recent Medical Services you may have received from other than [facility] providers in the past year (date may be approximate).     Assessment:   This is a routine wellness examination for Nikaya.  Hearing/Vision screen  Hearing Screening   125Hz  250Hz  500Hz  1000Hz  2000Hz  3000Hz  4000Hz  6000Hz  8000Hz   Right ear:           Left ear:           Vision Screening Comments: Patient gets annual eye exams   Dietary issues and exercise activities discussed: Current Exercise Habits: Home exercise routine, Type of exercise: strength training/weights, Time (Minutes): 15, Frequency (Times/Week): 7, Weekly Exercise (Minutes/Week): 105, Intensity: Mild, Exercise limited by:  None identified  Goals    . Patient Stated     Starting 01/28/18, I will continue to take medications as prescribed.     . Patient Stated     02/03/2019,  I will maintain and continue medications as prescribed.     . Patient Stated     02/04/2020, I will continue to do some strength training daily for about 15 minutes.       Depression Screen PHQ 2/9 Scores 02/04/2020 02/03/2019 01/28/2018 12/25/2016 12/21/2015 12/10/2014 11/07/2013  PHQ - 2 Score 0 0 0 0 0 0 0  PHQ- 9 Score 0 0 0 1 - - -    Fall Risk Fall Risk  02/04/2020 02/03/2019 01/28/2018 12/25/2016 12/21/2015  Falls in the past year? 0 0 0 No No  Number falls in past yr: 0 0 - - -  Injury with Fall? 0 0 - - -  Risk for fall due to : Medication side effect Medication side effect - - -  Follow up Falls evaluation completed;Falls prevention discussed Falls evaluation completed;Falls prevention discussed - - -    FALL RISK PREVENTION PERTAINING TO THE HOME:  Any stairs in or around the home? Yes  If so, are there any without handrails? No  Home free of loose throw rugs in walkways, pet beds, electrical cords, etc? Yes  Adequate lighting in your home to reduce risk of falls? Yes   ASSISTIVE DEVICES UTILIZED TO PREVENT FALLS:  Life alert? No  Use of a cane, [place] or w/c? No  Grab bars in the bathroom? No  Shower chair or bench in shower? No  Elevated toilet seat or a handicapped toilet? No   TIMED UP AND GO:  Was the test performed? N/A telephone visit.   Cognitive Function: MMSE - Mini Mental State Exam 02/04/2020 02/03/2019 01/28/2018 12/25/2016  Orientation to time 5 5 5 5   Orientation to Place 5 5 5 5   Registration 3 3 3 3   Attention/ Calculation 5 5 0 0  Recall 3 2 1 1   Recall-comments - - unable to recall 2 of 3 words unable to recall 2 of 3 words  Language- name 2 objects - - 0 0  Language- repeat 1 1 1 1   Language-  follow 3 step command - - 3 3  Language- read & follow direction - - 0 0  Write a sentence - - 0  0  Copy design - - 0 0  Total score - - 18 18  Mini Cog  Mini-Cog screen was completed. Maximum score is 22. A value of 0 denotes this part of the MMSE was not completed or the patient failed this part of the Mini-Cog screening.       Immunizations Immunization History  Administered Date(s) Administered  . Fluad Quad(high Dose 65+) 09/17/2018, 10/07/2019  . Influenza Whole 10/16/2008, 10/06/2009  . Influenza, High Dose Seasonal PF 10/28/2015, 10/26/2016  . Influenza,inj,Quad PF,6+ Mos 11/05/2012, 10/30/2013, 10/30/2014, 10/25/2017  . PFIZER(Purple Top)SARS-COV-2 Vaccination 02/15/2019, 03/12/2019, 11/21/2019  . PPD Test 04/21/2014  . Pneumococcal Conjugate-13 12/10/2014  . Pneumococcal Polysaccharide-23 10/26/2009  . Td 08/29/2005  . Tdap 04/05/2010  . Zoster 11/09/2009    TDAP status: Up to date  Flu Vaccine status: Up to date  Pneumococcal vaccine status: Up to date  Covid-19 vaccine status: Completed vaccines  Qualifies for Shingles Vaccine? Yes   Zostavax completed Yes   Shingrix Completed?: No.    Education has been provided regarding the importance of this vaccine. Patient has been advised to call insurance company to determine out of pocket expense if they have not yet received this vaccine. Advised may also receive vaccine at local pharmacy or Health Dept. Verbalized acceptance and understanding.  Screening Tests Health Maintenance  Topic Date Due  . MAMMOGRAM  03/04/2020  . TETANUS/TDAP  04/04/2020  . INFLUENZA VACCINE  Completed  . DEXA SCAN  Completed  . COVID-19 Vaccine  Completed  . PNA vac Low Risk Adult  Completed    Health Maintenance  There are no preventive care reminders to display for this patient.  Colorectal cancer screening: No longer required.   Mammogram status: Completed 03/05/2019. Repeat every year  Bone Density status: Completed 02/23/2017. Results reflect: Bone density results: NORMAL. Repeat every 2-5 years.  Lung Cancer  Screening: (Low Dose CT Chest recommended if Age 46-80 years, 30 pack-year currently smoking OR have quit w/in 15 years.) does not qualify.    Additional Screening:  Hepatitis C Screening: does not qualify; Completed N/A  Vision Screening: Recommended annual ophthalmology exams for early detection of glaucoma and other disorders of the eye. Is the patient up to date with their annual eye exam?  Yes  Who is the provider or what is the name of the office in which the patient attends annual eye exams? Dr. Lorie Apley  If pt is not established with a provider, would they like to be referred to a provider to establish care? No .   Dental Screening: Recommended annual dental exams for proper oral hygiene  Community Resource Referral / Chronic Care Management: CRR required this visit?  No   CCM required this visit?  No      Plan:     I have personally reviewed and noted the following in the patient's chart:   . Medical and social history . Use of alcohol, tobacco or illicit drugs  . Current medications and supplements . Functional ability and status . Nutritional status . Physical activity . Advanced directives . List of other physicians . Hospitalizations, surgeries, and ER visits in previous 12 months . Vitals . Screenings to include cognitive, depression, and falls . Referrals and appointments  In addition, I have reviewed and discussed with patient certain preventive protocols, quality metrics, and  best practice recommendations. A written personalized care plan for preventive services as well as general preventive health recommendations were provided to patient.   Due to this being a telephonic visit, the after visit summary with patients personalized plan was offered to patient via office or my-chart.  Patient preferred to pick up at office at next visit or via mychart.   Andrez Grime, LPN   [id number]

## 2020-02-04 NOTE — Patient Instructions (Signed)
Alexandria Petersen , Thank you for taking time to come for your Medicare Wellness Visit. I appreciate your ongoing commitment to your health goals. Please review the following plan we discussed and let me know if I can assist you in the future.   Screening recommendations/referrals: Colonoscopy: no longer required  Mammogram: scheduled 03/17/2020 Bone Density: Up to date, completed 02/23/2017, due 2-5 years  Recommended yearly ophthalmology/optometry visit for glaucoma screening and checkup Recommended yearly dental visit for hygiene and checkup  Vaccinations: Influenza vaccine: Up to date, completed 10/07/2019, due 82/2022 Pneumococcal vaccine: Completed series Tdap vaccine: Up to date, completed 04/05/2010, due 03/2020 Shingles vaccine: due, check with your insurance regarding coverage if interested    Covid-19:Completed series  Advanced directives: Advance directive discussed with you today. Even though you declined this today please call our office should you change your mind and we can give you the proper paperwork for you to fill out.  Conditions/risks identified: hypertension, hypercholesterolemia  Next appointment: Follow up in one year for your annual wellness visit    Preventive Care 82 Years and Older, Female Preventive care refers to lifestyle choices and visits with your health care provider that can promote health and wellness. What does preventive care include?  A yearly physical exam. This is also called an annual well check.  Dental exams once or twice a year.  Routine eye exams. Ask your health care provider how often you should have your eyes checked.  Personal lifestyle choices, including:  Daily care of your teeth and gums.  Regular physical activity.  Eating a healthy diet.  Avoiding tobacco and drug use.  Limiting alcohol use.  Practicing safe sex.  Taking low-dose aspirin every day.  Taking vitamin and mineral supplements as recommended by your health care  provider. What happens during an annual well check? The services and screenings done by your health care provider during your annual well check will depend on your age, overall health, lifestyle risk factors, and family history of disease. Counseling  Your health care provider may ask you questions about your:  Alcohol use.  Tobacco use.  Drug use.  Emotional well-being.  Home and relationship well-being.  Sexual activity.  Eating habits.  History of falls.  Memory and ability to understand (cognition).  Work and work Statistician.  Reproductive health. Screening  You may have the following tests or measurements:  Height, weight, and BMI.  Blood pressure.  Lipid and cholesterol levels. These may be checked every 5 years, or more frequently if you are over 66 years old.  Skin check.  Lung cancer screening. You may have this screening every year starting at age 82 if you have a 30-pack-year history of smoking and currently smoke or have quit within the past 15 years.  Fecal occult blood test (FOBT) of the stool. You may have this test every year starting at age 82.  Flexible sigmoidoscopy or colonoscopy. You may have a sigmoidoscopy every 5 years or a colonoscopy every 10 years starting at age 82.  Hepatitis C blood test.  Hepatitis B blood test.  Sexually transmitted disease (STD) testing.  Diabetes screening. This is done by checking your blood sugar (glucose) after you have not eaten for a while (fasting). You may have this done every 1-3 years.  Bone density scan. This is done to screen for osteoporosis. You may have this done starting at age 27.  Mammogram. This may be done every 1-2 years. Talk to your health care provider about how often  you should have regular mammograms. Talk with your health care provider about your test results, treatment options, and if necessary, the need for more tests. Vaccines  Your health care provider may recommend certain  vaccines, such as:  Influenza vaccine. This is recommended every year.  Tetanus, diphtheria, and acellular pertussis (Tdap, Td) vaccine. You may need a Td booster every 10 years.  Zoster vaccine. You may need this after age 25.  Pneumococcal 13-valent conjugate (PCV13) vaccine. One dose is recommended after age 55.  Pneumococcal polysaccharide (PPSV23) vaccine. One dose is recommended after age 2. Talk to your health care provider about which screenings and vaccines you need and how often you need them. This information is not intended to replace advice given to you by your health care provider. Make sure you discuss any questions you have with your health care provider. Document Released: 01/22/2015 Document Revised: 09/15/2015 Document Reviewed: 10/27/2014 Elsevier Interactive Patient Education  2017 [employer] Prevention in the Home Falls can cause injuries. They can happen to people of all ages. There are many things you can do to make your home safe and to help prevent falls. What can I do on the outside of my home?  Regularly fix the edges of walkways and driveways and fix any cracks.  Remove anything that might make you trip as you walk through a door, such as a raised step or threshold.  Trim any bushes or trees on the path to your home.  Use bright outdoor lighting.  Clear any walking paths of anything that might make someone trip, such as rocks or tools.  Regularly check to see if handrails are loose or broken. Make sure that both sides of any steps have handrails.  Any raised decks and porches should have guardrails on the edges.  Have any leaves, snow, or ice cleared regularly.  Use sand or salt on walking paths during winter.  Clean up any spills in your garage right away. This includes oil or grease spills. What can I do in the bathroom?  Use night lights.  Install grab bars by the toilet and in the tub and shower. Do not use towel bars as grab  bars.  Use non-skid mats or decals in the tub or shower.  If you need to sit down in the shower, use a plastic, non-slip stool.  Keep the floor dry. Clean up any water that spills on the floor as soon as it happens.  Remove soap buildup in the tub or shower regularly.  Attach bath mats securely with double-sided non-slip rug tape.  Do not have throw rugs and other things on the floor that can make you trip. What can I do in the bedroom?  Use night lights.  Make sure that you have a light by your bed that is easy to reach.  Do not use any sheets or blankets that are too big for your bed. They should not hang down onto the floor.  Have a firm chair that has side arms. You can use this for support while you get dressed.  Do not have throw rugs and other things on the floor that can make you trip. What can I do in the kitchen?  Clean up any spills right away.  Avoid walking on wet floors.  Keep items that you use a lot in easy-to-reach places.  If you need to reach something above you, use a strong step stool that has a grab bar.  Keep electrical cords  out of the way.  Do not use floor polish or wax that makes floors slippery. If you must use wax, use non-skid floor wax.  Do not have throw rugs and other things on the floor that can make you trip. What can I do with my stairs?  Do not leave any items on the stairs.  Make sure that there are handrails on both sides of the stairs and use them. Fix handrails that are broken or loose. Make sure that handrails are as long as the stairways.  Check any carpeting to make sure that it is firmly attached to the stairs. Fix any carpet that is loose or worn.  Avoid having throw rugs at the top or bottom of the stairs. If you do have throw rugs, attach them to the floor with carpet tape.  Make sure that you have a light switch at the top of the stairs and the bottom of the stairs. If you do not have them, ask someone to add them for  you. What else can I do to help prevent falls?  Wear shoes that:  Do not have high heels.  Have rubber bottoms.  Are comfortable and fit you well.  Are closed at the toe. Do not wear sandals.  If you use a stepladder:  Make sure that it is fully opened. Do not climb a closed stepladder.  Make sure that both sides of the stepladder are locked into place.  Ask someone to hold it for you, if possible.  Clearly mark and make sure that you can see:  Any grab bars or handrails.  First and last steps.  Where the edge of each step is.  Use tools that help you move around (mobility aids) if they are needed. These include:  Canes.  Walkers.  Scooters.  Crutches.  Turn on the lights when you go into a dark area. Replace any light bulbs as soon as they burn out.  Set up your furniture so you have a clear path. Avoid moving your furniture around.  If any of your floors are uneven, fix them.  If there are any pets around you, be aware of where they are.  Review your medicines with your doctor. Some medicines can make you feel dizzy. This can increase your chance of falling. Ask your doctor what other things that you can do to help prevent falls. This information is not intended to replace advice given to you by your health care provider. Make sure you discuss any questions you have with your health care provider. Document Released: 10/22/2008 Document Revised: 06/03/2015 Document Reviewed: 01/30/2014 Elsevier Interactive Patient Education  2017 [employer].

## 2020-02-04 NOTE — Progress Notes (Signed)
PCP notes:  Health Maintenance: No gaps noted   Abnormal Screenings: none   Patient concerns: none   Nurse concerns: none   Next PCP appt.: 02/10/2020 @ 2:20 pm

## 2020-02-05 NOTE — Progress Notes (Signed)
No critical labs need to be addressed urgently. We will discuss labs in detail at upcoming office visit.   

## 2020-02-10 ENCOUNTER — Other Ambulatory Visit: Payer: Self-pay

## 2020-02-10 ENCOUNTER — Ambulatory Visit (INDEPENDENT_AMBULATORY_CARE_PROVIDER_SITE_OTHER): Payer: Medicare Other | Admitting: Family Medicine

## 2020-02-10 ENCOUNTER — Encounter: Payer: Self-pay | Admitting: Family Medicine

## 2020-02-10 VITALS — BP 163/75 | HR 86 | Temp 98.7°F | Ht 61.5 in | Wt 156.5 lb

## 2020-02-10 DIAGNOSIS — R7303 Prediabetes: Secondary | ICD-10-CM

## 2020-02-10 DIAGNOSIS — E890 Postprocedural hypothyroidism: Secondary | ICD-10-CM

## 2020-02-10 DIAGNOSIS — I10 Essential (primary) hypertension: Secondary | ICD-10-CM

## 2020-02-10 DIAGNOSIS — E7800 Pure hypercholesterolemia, unspecified: Secondary | ICD-10-CM

## 2020-02-10 DIAGNOSIS — Z0000 Encounter for general adult medical examination without abnormal findings: Secondary | ICD-10-CM

## 2020-02-10 NOTE — Progress Notes (Signed)
Patient ID: Alexandria Petersen, female    DOB: March 17, 1940, 80 y.o.   MRN: [id number]  This visit was conducted in person.  BP (!) 184/64   Pulse (!) 114   Temp 98.7 F (37.1 C) (Temporal)   Ht 5' 1.5" (1.562 m)   Wt 156 lb 8 oz (71 kg)   SpO2 99%   BMI 29.09 kg/m    CC:  Chief Complaint  Patient presents with  . Annual Exam    Part 2    Subjective:   HPI: Alexandria Petersen is a 80 y.o. female presenting on 02/10/2020 for Annual Exam (Part 2) The patient presents for  complete physical and review of chronic health problems. He/She also has the following acute concerns today: none  The patient saw a LPN or RN for medicare wellness visit.  Prevention and wellness was reviewed in detail. Note reviewed and important notes copied below. Health Maintenance: No gaps noted Abnormal Screenings: None  02/10/20  Elevated Cholesterol:   LDL tolerable control on pravastatin 40 mg daily Lab Results  Component Value Date   CHOL 220 (H) 02/04/2020   HDL 98.40 02/04/2020   LDLCALC 104 (H) 02/04/2020   LDLDIRECT 90.7 10/29/2012   TRIG 90.0 02/04/2020   CHOLHDL 2 02/04/2020  Using medications without problems: none Muscle aches: none Diet compliance: Exercise: Other complaints:  Hypertension:    Not at goal in office today, but hx of white coat HTN.Marland Kitchen on losartan, HCTZ BP Readings from Last 3 Encounters:  02/10/20 (!) 184/64  10/07/19 (!) 160/64  09/18/19 (!) 170/60  Using medication without problems or lightheadedness:  none Chest pain with exertion: none Edema:none Short of breath: none Average home BPs: At home measurement 135-139/71-83 Other issues:  Prediabetes:  Lab Results  Component Value Date   HGBA1C 5.9 02/04/2020   Hypothyroid  At goal on levothyroxine 75 mcg daily Lab Results  Component Value Date   TSH 3.85 02/04/2020        Relevant past medical, surgical, family and social history reviewed and updated as indicated. Interim  medical history since our last visit reviewed. Allergies and medications reviewed and updated. Outpatient Medications Prior to Visit  Medication Sig Dispense Refill  . acetaminophen (TYLENOL) 650 MG CR tablet Take 650 mg by mouth every 8 (eight) hours as needed for pain.    . hydrochlorothiazide (MICROZIDE) 12.5 MG capsule TAKE 1 CAPSULE BY MOUTH EVERY DAY 90 capsule 1  . KLOR-CON M20 20 MEQ tablet     . Lactobacillus Rhamnosus, GG, (CULTURELLE) CAPS     . levothyroxine (SYNTHROID) 75 MCG tablet TAKE 1 TABLET BY MOUTH EVERY DAY EXCEPT ON SUNDAY ONLY TAKE 1&1/2 TABS 96 tablet 1  . losartan (COZAAR) 100 MG tablet TAKE 1 TABLET BY MOUTH EVERY DAY 90 tablet 1  . Multiple Vitamins-Minerals (CENTRUM SILVER ADULT 50+ PO) Take 1 tablet by mouth daily.    . pravastatin (PRAVACHOL) 40 MG tablet TAKE 1 TABLET BY MOUTH EVERY DAY 90 tablet 3   No facility-administered medications prior to visit.     Per HPI unless specifically indicated in ROS section below Review of Systems  Constitutional: Negative for fatigue and fever.  HENT: Negative for congestion.   Eyes: Negative for pain.  Respiratory: Negative for cough and shortness of breath.   Cardiovascular: Negative for chest pain, palpitations and leg swelling.  Gastrointestinal: Negative for abdominal pain.  Genitourinary: Negative for dysuria and vaginal bleeding.  Musculoskeletal: Negative for back pain.  Neurological: Negative for syncope, light-headedness and headaches.  Psychiatric/Behavioral: Negative for dysphoric mood.   Objective:  BP (!) 184/64   Pulse (!) 114   Temp 98.7 F (37.1 C) (Temporal)   Ht 5' 1.5" (1.562 m)   Wt 156 lb 8 oz (71 kg)   SpO2 99%   BMI 29.09 kg/m   Wt Readings from Last 3 Encounters:  02/10/20 156 lb 8 oz (71 kg)  10/07/19 161 lb (73 kg)  09/18/19 159 lb 12.8 oz (72.5 kg)      Physical Exam Constitutional:      General: She is not in acute distress.Vital signs are normal.     Appearance: Normal  appearance. She is well-developed and well-nourished. She is not ill-appearing or toxic-appearing.  HENT:     Head: Normocephalic.     Right Ear: Hearing, tympanic membrane, ear canal and external ear normal. Tympanic membrane is not erythematous, retracted or bulging.     Left Ear: Hearing, tympanic membrane, ear canal and external ear normal. Tympanic membrane is not erythematous, retracted or bulging.     Nose: No mucosal edema or rhinorrhea.     Right Sinus: No maxillary sinus tenderness or frontal sinus tenderness.     Left Sinus: No maxillary sinus tenderness or frontal sinus tenderness.     Mouth/Throat:     Mouth: Oropharynx is clear and moist and mucous membranes are normal.     Pharynx: Uvula midline.  Eyes:     General: Lids are normal. Lids are everted, no foreign bodies appreciated.     Extraocular Movements: EOM normal.     Conjunctiva/sclera: Conjunctivae normal.     Pupils: Pupils are equal, round, and reactive to light.  Neck:     Thyroid: No thyroid mass or thyromegaly.     Vascular: No carotid bruit.     Trachea: Trachea normal.  Cardiovascular:     Rate and Rhythm: Normal rate and regular rhythm.     Pulses: Normal pulses and intact distal pulses.     Heart sounds: Normal heart sounds, S1 normal and S2 normal. No murmur heard. No friction rub. No gallop.   Pulmonary:     Effort: Pulmonary effort is normal. No tachypnea or respiratory distress.     Breath sounds: Normal breath sounds. No decreased breath sounds, wheezing, rhonchi or rales.  Abdominal:     General: Bowel sounds are normal.     Palpations: Abdomen is soft.     Tenderness: There is no abdominal tenderness.  Musculoskeletal:     Cervical back: Normal range of motion and neck supple.  Skin:    General: Skin is warm, dry and intact.     Findings: No rash.  Neurological:     Mental Status: She is alert.  Psychiatric:        Mood and Affect: Mood is not anxious or depressed.        Speech: Speech  normal.        Behavior: Behavior normal. Behavior is cooperative.        Thought Content: Thought content normal.        Cognition and Memory: Cognition and memory normal.        Judgment: Judgment normal.       Results for orders placed or performed in visit on 02/04/20  Hemoglobin A1c  Result Value Ref Range   Hgb A1c MFr Bld 5.9 4.6 - 6.5 %  Comprehensive metabolic panel  Result Value Ref Range  Sodium 141 135 - 145 mEq/L   Potassium 3.7 3.5 - 5.1 mEq/L   Chloride 103 96 - 112 mEq/L   CO2 29 19 - 32 mEq/L   Glucose, Bld 107 (H) 70 - 99 mg/dL   BUN 19 6 - 23 mg/dL   Creatinine, Ser 0.95 0.40 - 1.20 mg/dL   Total Bilirubin 1.1 0.2 - 1.2 mg/dL   Alkaline Phosphatase 46 39 - 117 U/L   AST 19 0 - 37 U/L   ALT 16 0 - 35 U/L   Total Protein 7.4 6.0 - 8.3 g/dL   Albumin 4.3 3.5 - 5.2 g/dL   GFR 56.31 (L) >60.00 mL/min   Calcium 10.2 8.4 - 10.5 mg/dL  Lipid panel  Result Value Ref Range   Cholesterol 220 (H) 0 - 200 mg/dL   Triglycerides 90.0 0.0 - 149.0 mg/dL   HDL 98.40 >39.00 mg/dL   VLDL 18.0 0.0 - 40.0 mg/dL   LDL Cholesterol 104 (H) 0 - 99 mg/dL   Total CHOL/HDL Ratio 2    NonHDL 121.56   T4, free  Result Value Ref Range   Free T4 1.14 0.60 - 1.60 ng/dL  TSH  Result Value Ref Range   TSH 3.85 0.35 - 4.50 uIU/mL    This visit occurred during the SARS-CoV-2 public health emergency.  Safety protocols were in place, including screening questions prior to the visit, additional usage of staff PPE, and extensive cleaning of exam room while observing appropriate contact time as indicated for disinfecting solutions.   COVID 19 screen:  No recent travel or known exposure to COVID19 The patient denies respiratory symptoms of COVID 19 at this time. The importance of social distancing was discussed today.   Assessment and Plan   The patient's preventative maintenance and recommended screening tests for an annual wellness exam were reviewed in full today. Brought up to date  unless services declined.  Counselled on the importance of diet, exercise, and its role in overall health and mortality. The patient's FH and SH was reviewed, including their home life, tobacco status, and drug and alcohol status.   Vaccines:uptodate Td, flu,  COVID vaccine x 3 , Rx for shingrix given. Pap/DVE:No pap indicated Mammo:nml2/2021, has appt for 03/2020 Bone Density:02/2017 osteopenia Colon:yearly stool cards..neg 01/2017, no further indicated Smoking Status:nonsmoker  Problem List Items Addressed This Visit    HYPERCHOLESTEROLEMIA, PURE (Chronic)    Stable, chronic.  Continue current medication.   Pravastatin  40 mg daily.      HYPERTENSION, BENIGN ESSENTIAL (Chronic)    Stable, chronic. White coat HTN.Marland Kitchen good control at home.  Continue current medication.   Losartan 100 mg daily   HCTZ 25 mg daily.      Postablative hypothyroidism (Chronic)   Prediabetes (Chronic)    Encouraged exercise, weight loss, healthy eating habits.        Other Visit Diagnoses    Routine general medical examination at a health care facility    -  Primary       Eliezer Lofts, MD

## 2020-02-10 NOTE — Assessment & Plan Note (Signed)
Stable, chronic. White coat HTN.Marland Kitchen good control at home.  Continue current medication.   Losartan 100 mg daily   HCTZ 25 mg daily.

## 2020-02-10 NOTE — Patient Instructions (Signed)
Can use voltaren gel four times a day for knee pain.  Can use tylenol 500 mg 2 tabs every 8 hours for pain.

## 2020-02-10 NOTE — Assessment & Plan Note (Signed)
Stable, chronic.  Continue current medication.  Pravastatin 40 mg daily.    

## 2020-02-10 NOTE — Assessment & Plan Note (Signed)
Encouraged exercise, weight loss, healthy eating habits. ? ?

## 2020-02-24 ENCOUNTER — Telehealth: Payer: Self-pay

## 2020-02-24 ENCOUNTER — Other Ambulatory Visit: Payer: Self-pay | Admitting: Family Medicine

## 2020-02-24 NOTE — Telephone Encounter (Signed)
Pt has lab appt for Dr Dwyane Dee scheduled for Wednesday 3.2.2022 here at [facility].  Per Dr Dwyane Dee she does not need any labs prior to their visit- she had labs drawn 3 weeks ago.  I am canceling her lab appt.  Please inform pt she need not come in for labs.

## 2020-02-24 NOTE — Telephone Encounter (Signed)
Patient returned Alexandria Petersen's call. I let her know lab appointment was cancelled.

## 2020-03-08 ENCOUNTER — Other Ambulatory Visit: Payer: Self-pay

## 2020-03-08 ENCOUNTER — Encounter: Payer: Self-pay | Admitting: Endocrinology

## 2020-03-08 ENCOUNTER — Ambulatory Visit (INDEPENDENT_AMBULATORY_CARE_PROVIDER_SITE_OTHER): Payer: Medicare Other | Admitting: Endocrinology

## 2020-03-08 VITALS — BP 142/72 | HR 119 | Ht 63.0 in | Wt 155.0 lb

## 2020-03-08 DIAGNOSIS — E890 Postprocedural hypothyroidism: Secondary | ICD-10-CM

## 2020-03-08 NOTE — Progress Notes (Signed)
Patient ID: Alexandria Petersen, female   DOB: [birth year], 80 y.o.   MRN: [id number]                                                                                                               Reason for Appointment: Hypothyroidism, follow up     History of Present Illness:   Prior history: When she was first seen in 12/16 she had symptoms of weight loss,  palpitations, feeling tired, occasionally feeling warm and some weakness in her legs. Also was having decreased appetite.  She previously had suppressed TSH levels since at least 10/2013 Pretreatment labs showed increased free T4 and free T3 levels in 12/16  She was also found to have a goiter on exam initially.  Was started on Methimazole 5 mg twice a day in 12/2014   Her methimazole was tapered off and stopped in 09/2015 when her free T4 was low normal However without her methimazole she had a recurrence of her hyperthyroidism in 1/18; she was having symptoms of palpitations and fatigue She finally had her I-131 treatment on 05/02/16 with 18.1 mCi   RECENT history:  She had become hypothyroid in 07/2016 when she was having fatigue, increased weight and hoarseness She has been started on levothyroxine, initially 112 g and then this was reduced gradually  She has been on 75 mcg since 2019  This was increased by half tablet weekly when TSH was 8.1 in 5/20  She has been taking 75 mcg of levothyroxine along with 1-1/2 tablets on Sundays giving her an average of 80 mcg daily  No new complaints of unusual fatigue Occasionally may feel a little sweaty including in her underarms but no heat intolerance as such  She has lost about 5 pounds  She has been regular with taking her levothyroxine about an hour before breakfast daily  TSH last month was still normal although slightly higher than usual  Wt Readings from Last 3 Encounters:  03/08/20 155 lb (70.3 kg)  02/10/20 156 lb 8 oz (71 kg)  10/07/19 161 lb (73 kg)   Lab  Results  Component Value Date   TSH 3.85 02/04/2020   TSH 2.46 09/18/2019   TSH 1.40 03/18/2019   FREET4 1.14 02/04/2020   FREET4 1.06 09/18/2019   FREET4 1.52 03/18/2019       Allergies as of 03/08/2020      Reactions   Penicillins    REACTION: hallucinations      Medication List       Accurate as of March 08, 2020  4:44 PM. If you have any questions, ask your nurse or doctor.        acetaminophen 650 MG CR tablet Commonly known as: TYLENOL Take 650 mg by mouth every 8 (eight) hours as needed for pain.   CENTRUM SILVER ADULT 50+ PO Take 1 tablet by mouth daily.   Culturelle Caps   hydrochlorothiazide 12.5 MG capsule Commonly known as: MICROZIDE TAKE 1 CAPSULE BY MOUTH EVERY DAY   Klor-Con M20 20  MEQ tablet Generic drug: potassium chloride SA   levothyroxine 75 MCG tablet Commonly known as: SYNTHROID TAKE 1 TABLET BY MOUTH EVERY DAY EXCEPT ON SUNDAY ONLY TAKE 1&1/2 TABS   losartan 100 MG tablet Commonly known as: COZAAR TAKE 1 TABLET BY MOUTH EVERY DAY   pravastatin 40 MG tablet Commonly known as: PRAVACHOL TAKE 1 TABLET BY MOUTH EVERY DAY           Past Medical History:  Diagnosis Date  . Arthritis    knee  . HLD (hyperlipidemia)   . Hypertension   . Hypothyroidism   . Osteoporosis   . Wears dentures    Full upper and lower.  Only wears upper.    Past Surgical History:  Procedure Laterality Date  . BREAST BIOPSY Left   . CATARACT EXTRACTION W/PHACO Left 07/02/2019   Procedure: CATARACT EXTRACTION PHACO AND INTRAOCULAR LENS PLACEMENT (IOC) LEFT 4.51 00:56.4;  Surgeon: Leandrew Koyanagi, MD;  Location: [place];  Service: Ophthalmology;  Laterality: Left;  . CATARACT EXTRACTION W/PHACO Right 09/03/2019   Procedure: CATARACT EXTRACTION PHACO AND INTRAOCULAR LENS PLACEMENT (IOC) RIGHT DIABETIC 9.40 01:13.6 12.8 %;  Surgeon: Leandrew Koyanagi, MD;  Location: [place];  Service: Ophthalmology;  Laterality: Right;  .  HEMORROIDECTOMY     ?  . INTRAOCULAR LENS INSERTION Bilateral   . ROTATOR CUFF REPAIR Right   . TUBAL LIGATION      Family History  Problem Relation Age of Onset  . Hypertension Mother   . Diabetes Mother   . Hypertension Father   . Diabetes Sister   . Diabetes Brother   . Hypothyroidism Daughter   . Heart disease Son   . Hypothyroidism Son     Social History:  reports that she has never smoked. She has never used smokeless tobacco. She reports that she does not drink alcohol and does not use drugs.  Allergies:  Allergies  Allergen Reactions  . Penicillins     REACTION: hallucinations     Review of Systems  Blood pressure is being treated with losartan and HCTZ  Hypertension has been managed by her PCP She is followed by her PCP She is taking her blood pressure regularly at home and apparently her blood pressure at home is about 952 systolic   She usually has significant whitecoat hypertension  BP Readings from Last 3 Encounters:  03/08/20 (!) 142/72  02/10/20 (!) 163/75  10/07/19 (!) 160/64    She has impaired fasting glucose followed by her PCP   Examination:   BP (!) 142/72   Pulse (!) 119   Ht 5\' 3"  (1.6 m)   Wt 155 lb (70.3 kg)   SpO2 99%   BMI 27.46 kg/m   Thyroid not palpable No tremor Biceps reflexes appear normal Hands are not diaphoretic   Assessment/Plan:  Post-ablative hypothyroidism since 07/2016  She is taking the equivalent of 80 mcg of levothyroxine since 2020 She has taken this very regularly Her TSH was normal last month although upper normal compared to before Considering her age her TSH is still adequate  No new complaints, has been able to bring her weight down some  She will continue the same regimen and come back in 6 months for follow-up  There are no Patient Instructions on file for this visit.     Elayne Snare 03/08/2020, 4:44 PM   Note: This office note was prepared with Dragon voice recognition system  technology. Any transcriptional errors that result from this process  are unintentional.

## 2020-03-11 ENCOUNTER — Other Ambulatory Visit: Payer: Medicare Other

## 2020-03-17 ENCOUNTER — Ambulatory Visit: Payer: Medicare Other

## 2020-03-18 ENCOUNTER — Ambulatory Visit: Payer: Medicare Other | Admitting: Endocrinology

## 2020-03-19 ENCOUNTER — Ambulatory Visit
Admission: RE | Admit: 2020-03-19 | Discharge: 2020-03-19 | Disposition: A | Discharge status: Home / Self Care | Payer: Medicare Other | Source: Ambulatory Visit | Attending: Family Medicine | Admitting: Family Medicine

## 2020-03-19 ENCOUNTER — Other Ambulatory Visit: Payer: Self-pay

## 2020-03-19 DIAGNOSIS — Z1231 Encounter for screening mammogram for malignant neoplasm of breast: Secondary | ICD-10-CM | POA: Diagnosis not present

## 2020-04-02 ENCOUNTER — Telehealth: Payer: Self-pay

## 2020-04-02 NOTE — Telephone Encounter (Signed)
Call  We can  send for ABIs to determine if true issue. Let me know if pt agreeable.

## 2020-04-02 NOTE — Telephone Encounter (Signed)
Monica, NP with [facility] called and stated that she did an at home PAD Screening on the patient and it was abnormal. NP stated that the LLE reading was 0.84, but the RLE was normal. Patient is asymptomatic, but it is there protocol to report abnormal findings. Brayton Layman, NP stated that an official report will be faxed within the next few weeks.

## 2020-04-05 NOTE — Telephone Encounter (Signed)
Left message for Ms. Seigler to return my call.

## 2020-04-05 NOTE — Telephone Encounter (Signed)
Anisha,  Was there a return phone number for [facility]?

## 2020-04-05 NOTE — Telephone Encounter (Signed)
Spoke with Ms. Cannady.  She is agreeable to doing the ABIs.

## 2020-04-05 NOTE — Telephone Encounter (Deleted)
No return phone number for [facility] so I am unable to call to see if they can add.

## 2020-04-05 NOTE — Telephone Encounter (Signed)
No she didn't give me her return number. I apologize.

## 2020-04-06 ENCOUNTER — Other Ambulatory Visit: Payer: Self-pay | Admitting: Family Medicine

## 2020-04-06 DIAGNOSIS — R0989 Other specified symptoms and signs involving the circulatory and respiratory systems: Secondary | ICD-10-CM

## 2020-04-06 NOTE — Telephone Encounter (Signed)
Order placed

## 2020-04-27 IMAGING — DX DG KNEE COMPLETE 4+V*L*
4 series · 4 of 4 positions shown · non-contrast
Comparison: Radiographs December 21, 2015.

CLINICAL DATA: Left knee pain.

EXAM:
LEFT KNEE - COMPLETE 4+ VIEW

[knee ap]
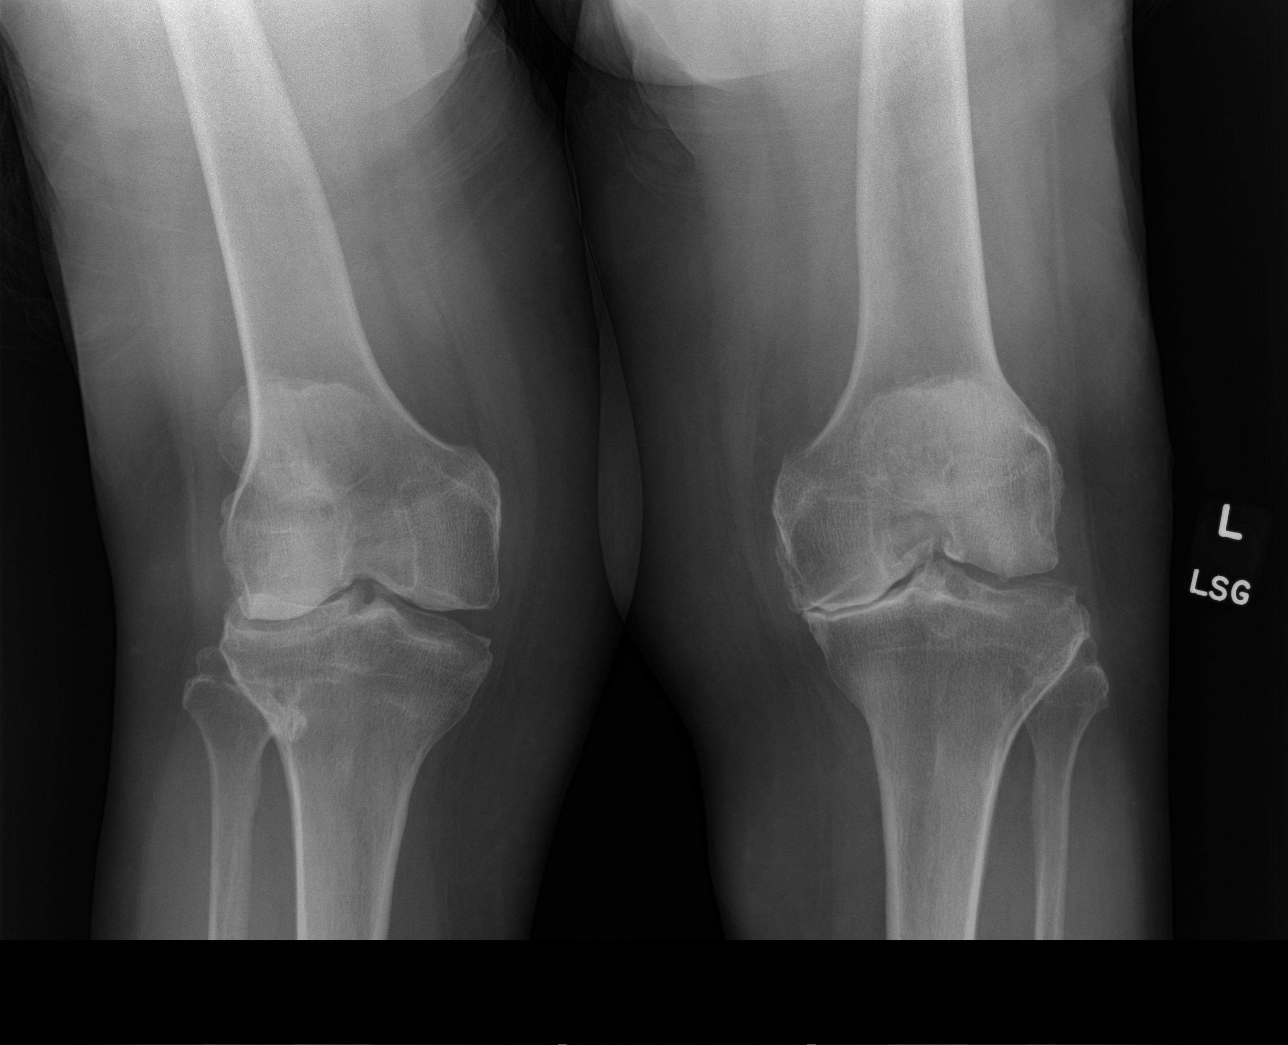

[knee tunnel]
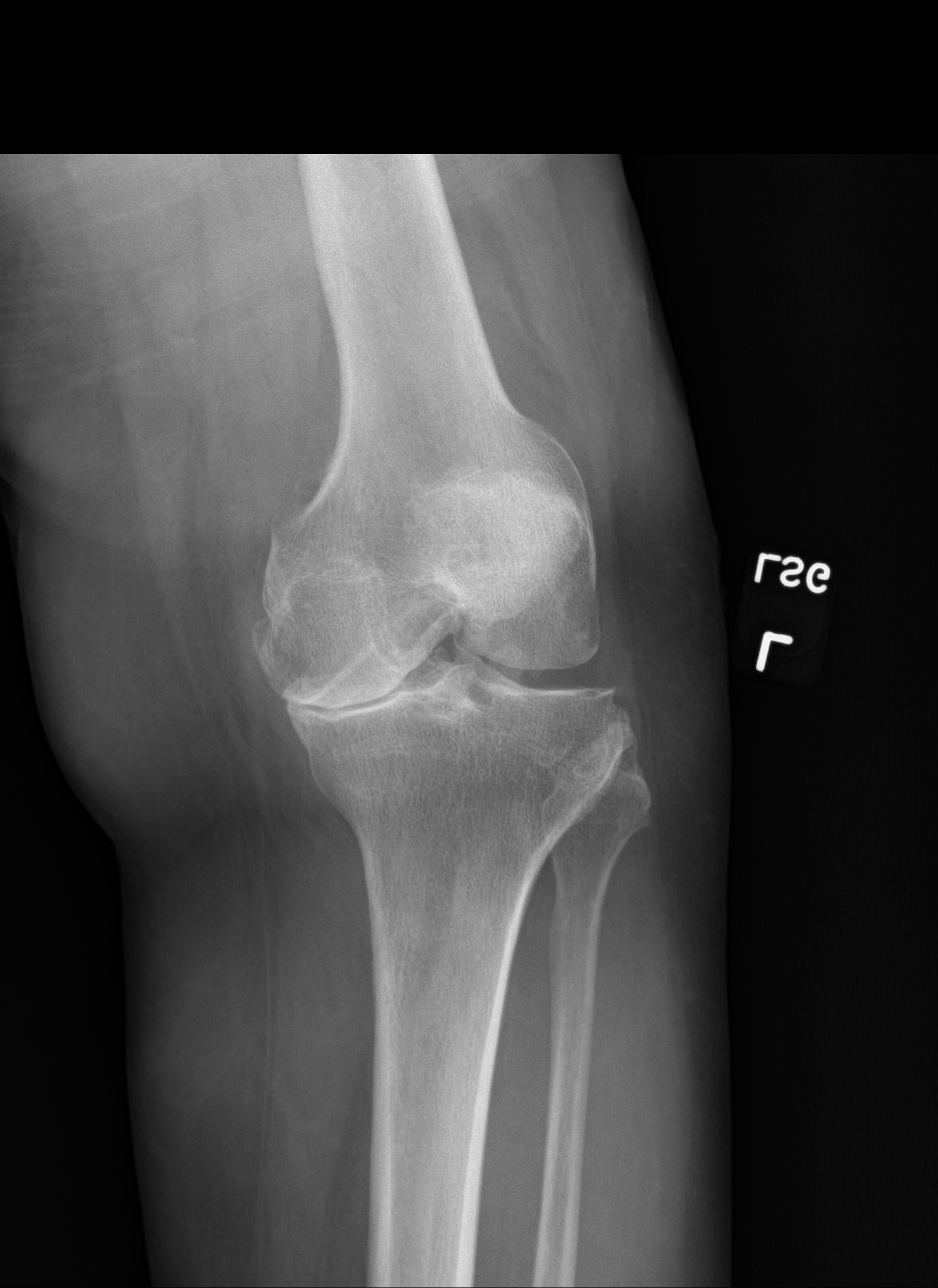

[knee lat]
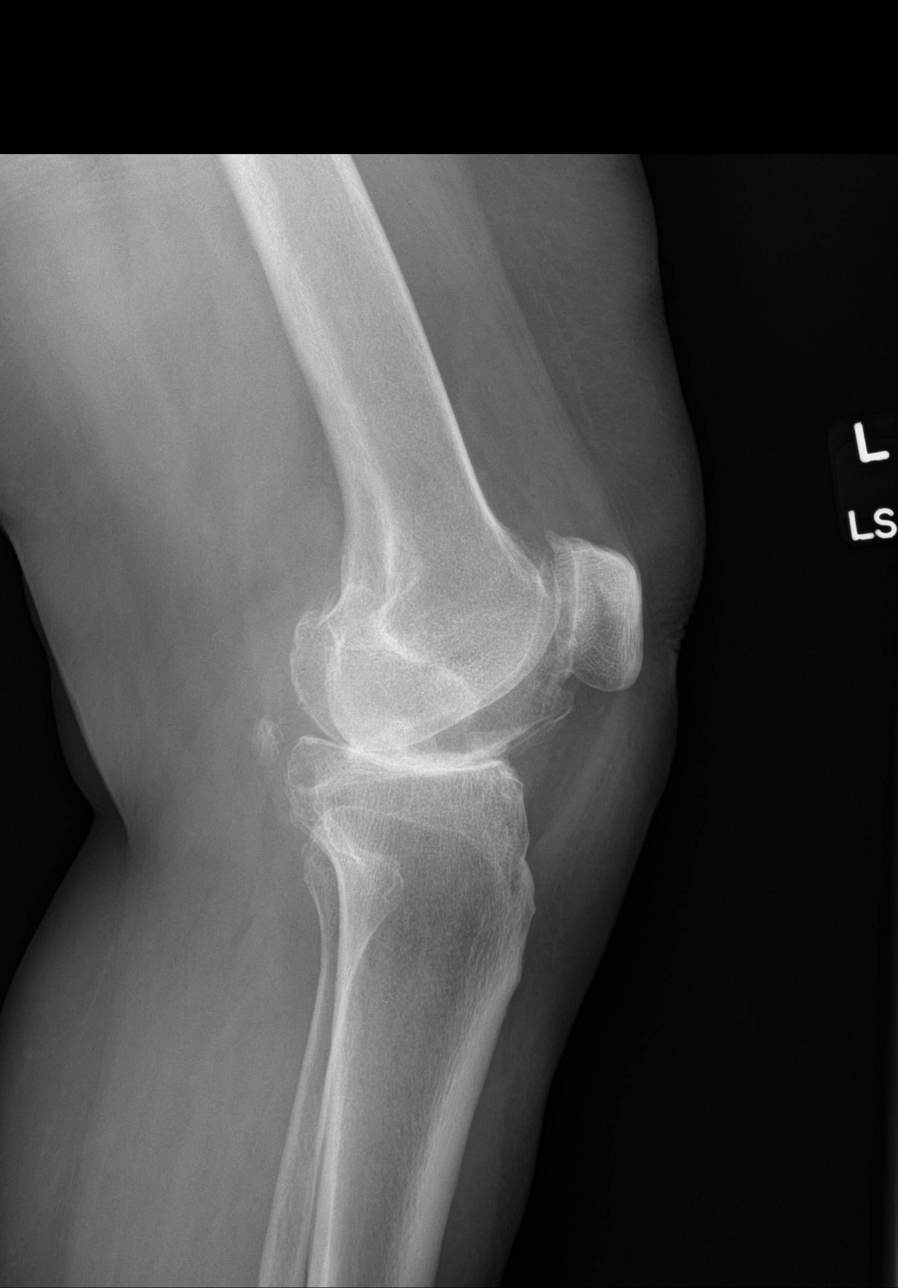

[patella skyline]
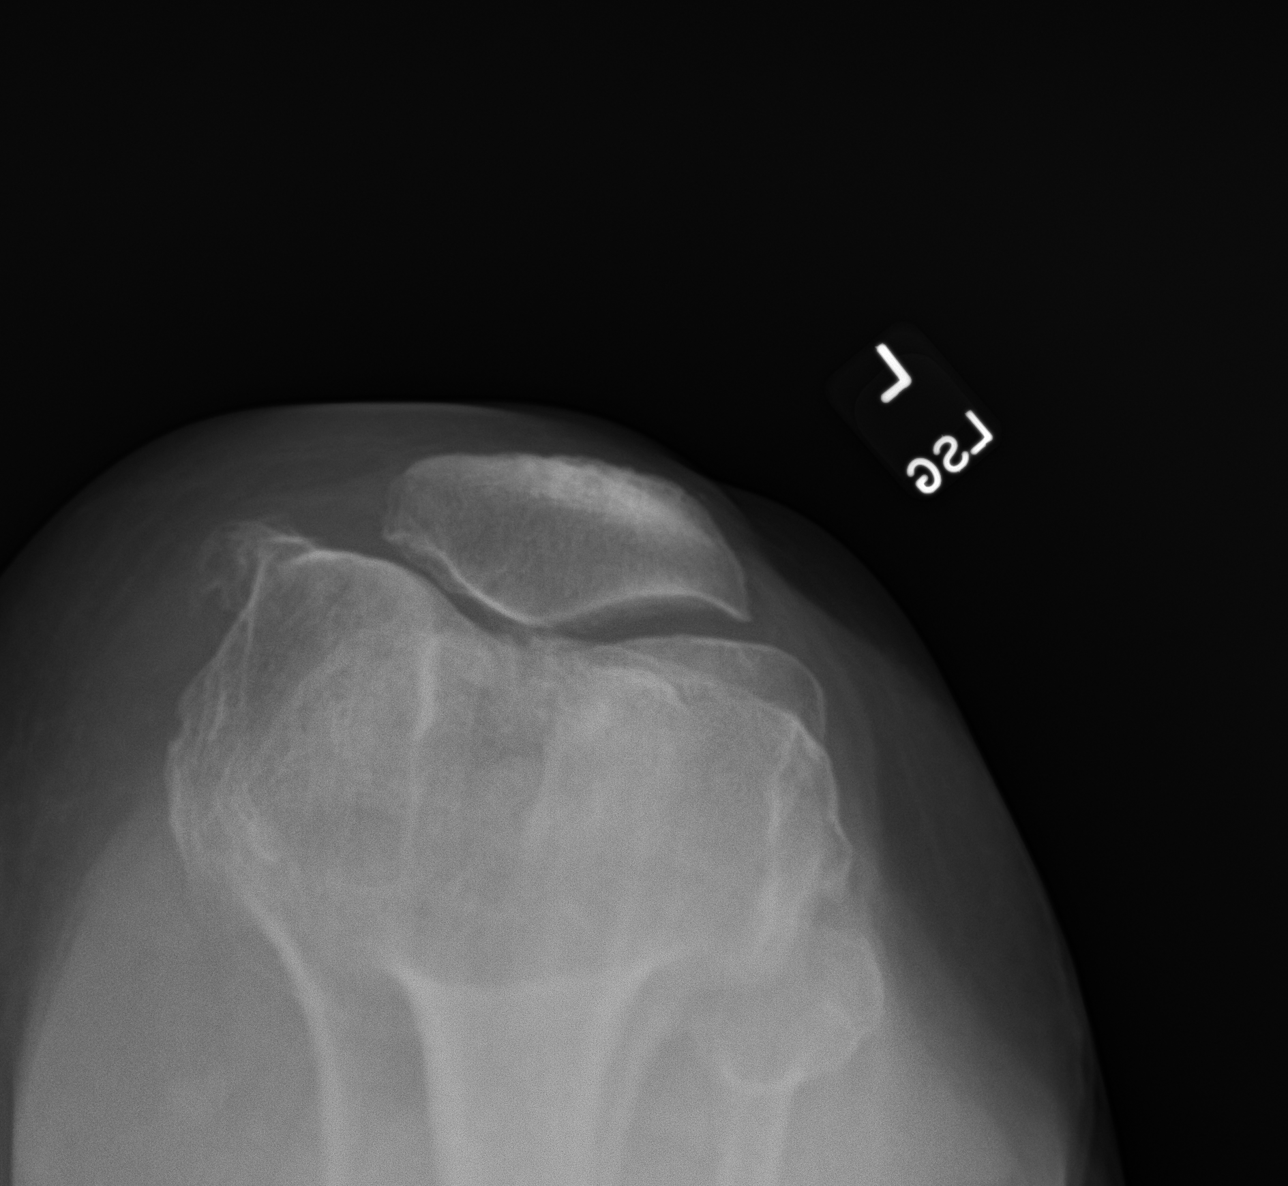

[4 of 4 positions shown; findings below may reference images not displayed]

FINDINGS: No evidence of fracture, dislocation, or joint effusion. Severe
narrowing of medial joint space is noted. Mild patellar spurring is
noted. Soft tissues are unremarkable.
IMPRESSION: Severe degenerative joint disease is noted medially. No acute
abnormality seen in the left knee.

## 2020-05-19 ENCOUNTER — Other Ambulatory Visit: Payer: Self-pay | Admitting: Family Medicine

## 2020-05-21 ENCOUNTER — Other Ambulatory Visit: Payer: Self-pay | Admitting: Endocrinology

## 2020-05-27 ENCOUNTER — Ambulatory Visit (INDEPENDENT_AMBULATORY_CARE_PROVIDER_SITE_OTHER): Payer: Medicare Other

## 2020-05-27 ENCOUNTER — Other Ambulatory Visit: Payer: Self-pay

## 2020-05-27 DIAGNOSIS — R0989 Other specified symptoms and signs involving the circulatory and respiratory systems: Secondary | ICD-10-CM | POA: Diagnosis not present

## 2020-07-27 ENCOUNTER — Other Ambulatory Visit (HOSPITAL_COMMUNITY): Payer: Self-pay

## 2020-09-09 ENCOUNTER — Other Ambulatory Visit: Payer: Self-pay

## 2020-09-09 ENCOUNTER — Ambulatory Visit (INDEPENDENT_AMBULATORY_CARE_PROVIDER_SITE_OTHER): Payer: Medicare Other | Admitting: Endocrinology

## 2020-09-09 ENCOUNTER — Encounter: Payer: Self-pay | Admitting: Endocrinology

## 2020-09-09 DIAGNOSIS — E890 Postprocedural hypothyroidism: Secondary | ICD-10-CM

## 2020-09-09 LAB — TSH: TSH: 1.33 u[IU]/mL (ref 0.35–5.50)

## 2020-09-09 LAB — T4, FREE: Free T4: 0.97 ng/dL (ref 0.60–1.60)

## 2020-09-09 NOTE — Progress Notes (Signed)
Patient ID: Alexandria Petersen, female   DOB: January 04, 1941, 80 y.o.   MRN: [id number]                                                                                                               Reason for Appointment: Hypothyroidism, follow up     History of Present Illness:   Prior history: When she was first seen in 12/16 she had symptoms of weight loss,  palpitations, feeling tired, occasionally feeling warm and some weakness in her legs. Also was having decreased appetite.  She previously had suppressed TSH levels since at least 10/2013 Pretreatment labs showed increased free T4 and free T3 levels in 12/16  She was also found to have a goiter on exam initially.  Was started on Methimazole 5 mg twice a day in 12/2014   Her methimazole was tapered off and stopped in 09/2015 when her free T4 was low normal However without her methimazole she had a recurrence of her hyperthyroidism in 1/18; she was having symptoms of palpitations and fatigue She finally had her I-131 treatment on 05/02/16 with 18.1 mCi   RECENT history:  She had become hypothyroid in 07/2016 when she was having fatigue, increased weight and hoarseness She has been started on levothyroxine, initially 112 g and then this was reduced gradually  She has been on 75 mcg since 2019 and taking extra half tablet weekly when TSH was 8.1 in 5/20  This gives her 80 mcg daily with this regimen  Although she has occasional difficulty sleeping he does not feel tired or lethargic Her weight has leveled off Also no cold intolerance   She has been regular with taking her levothyroxine about an hour before breakfast daily Has not missed any doses  TSH last was upper normal and her dose was not changed Labs pending  Wt Readings from Last 3 Encounters:  09/09/20 161 lb 3.2 oz (73.1 kg)  03/08/20 155 lb (70.3 kg)  02/10/20 156 lb 8 oz (71 kg)   Lab Results  Component Value Date   TSH 3.85 02/04/2020   TSH 2.46  09/18/2019   TSH 1.40 03/18/2019   FREET4 1.14 02/04/2020   FREET4 1.06 09/18/2019   FREET4 1.52 03/18/2019       Allergies as of 09/09/2020       Reactions   Penicillins    REACTION: hallucinations        Medication List        Accurate as of September 09, 2020  3:56 PM. If you have any questions, ask your nurse or doctor.          acetaminophen 650 MG CR tablet Commonly known as: TYLENOL Take 650 mg by mouth every 8 (eight) hours as needed for pain.   CENTRUM SILVER ADULT 50+ PO Take 1 tablet by mouth daily.   Culturelle Caps   hydrochlorothiazide 12.5 MG capsule Commonly known as: MICROZIDE TAKE 1 CAPSULE BY MOUTH EVERY DAY   Klor-Con M20 20 MEQ tablet Generic drug: potassium chloride  SA   levothyroxine 75 MCG tablet Commonly known as: SYNTHROID TAKE 1 TABLET BY MOUTH EVERY DAY EXCEPT ON SUNDAY ONLY TAKE 1&1/2 TABS   losartan 100 MG tablet Commonly known as: COZAAR TAKE 1 TABLET BY MOUTH EVERY DAY   pravastatin 40 MG tablet Commonly known as: PRAVACHOL TAKE 1 TABLET BY MOUTH EVERY DAY            Past Medical History:  Diagnosis Date   Arthritis    knee   HLD (hyperlipidemia)    Hypertension    Hypothyroidism    Osteoporosis    Wears dentures    Full upper and lower.  Only wears upper.    Past Surgical History:  Procedure Laterality Date   BREAST BIOPSY Left    CATARACT EXTRACTION W/PHACO Left 07/02/2019   Procedure: CATARACT EXTRACTION PHACO AND INTRAOCULAR LENS PLACEMENT (IOC) LEFT 4.51 00:56.4;  Surgeon: Leandrew Koyanagi, MD;  Location: [place];  Service: Ophthalmology;  Laterality: Left;   CATARACT EXTRACTION W/PHACO Right 09/03/2019   Procedure: CATARACT EXTRACTION PHACO AND INTRAOCULAR LENS PLACEMENT (IOC) RIGHT DIABETIC 9.40 01:13.6 12.8 %;  Surgeon: Leandrew Koyanagi, MD;  Location: [place];  Service: Ophthalmology;  Laterality: Right;   HEMORROIDECTOMY     ?   INTRAOCULAR LENS INSERTION Bilateral     ROTATOR CUFF REPAIR Right    TUBAL LIGATION      Family History  Problem Relation Age of Onset   Hypertension Mother    Diabetes Mother    Hypertension Father    Diabetes Sister    Diabetes Brother    Hypothyroidism Daughter    Heart disease Son    Hypothyroidism Son     Social History:  reports that she has never smoked. She has never used smokeless tobacco. She reports that she does not drink alcohol and does not use drugs.  Allergies:  Allergies  Allergen Reactions   Penicillins     REACTION: hallucinations     Review of Systems  Blood pressure is being treated with losartan and HCTZ  Hypertension has been managed by her PCP She is followed by her PCP  She is taking her blood pressure regularly at home and her blood pressure at home is about [id number] systolic   She usually has significant whitecoat hypertension and blood pressure was significantly high today done twice  BP Readings from Last 3 Encounters:  09/09/20 (!) 184/80  03/08/20 (!) 142/72  02/10/20 (!) 163/75    She has impaired fasting glucose followed by her PCP   Examination:   BP (!) 184/80   Pulse (!) 107   Ht '5\' 4"'$  (1.626 m)   Wt 161 lb 3.2 oz (73.1 kg)   SpO2 98%   BMI 27.67 kg/m    No tremor, hands feel normal to touch Biceps reflexes appear normal as well as ankle reflexes No ankle edema   Assessment/Plan:  Post-ablative hypothyroidism since 07/2016  She is taking the equivalent of 80 mcg of levothyroxine since 2020 She has taken this daily before eating regularly Her TSH was high normal on the last visit but since she was subjectively doing well the dose was not changed  She feels fairly good recently  Her labs were checked today and will decide on her dosage and follow-up based on results  There are no Patient Instructions on file for this visit.     Elayne Snare 09/09/2020, 3:56 PM   Note: This office note was prepared with Dragon voice recognition  system technology.  Any transcriptional errors that result from this process are unintentional.

## 2020-11-14 ENCOUNTER — Other Ambulatory Visit: Payer: Self-pay | Admitting: Family Medicine

## 2020-11-14 ENCOUNTER — Other Ambulatory Visit: Payer: Self-pay | Admitting: Endocrinology

## 2021-01-19 ENCOUNTER — Other Ambulatory Visit: Payer: Self-pay | Admitting: Endocrinology

## 2021-01-22 ENCOUNTER — Other Ambulatory Visit: Payer: Self-pay | Admitting: Family Medicine

## 2021-01-31 ENCOUNTER — Telehealth: Payer: Self-pay | Admitting: Family Medicine

## 2021-01-31 DIAGNOSIS — E890 Postprocedural hypothyroidism: Secondary | ICD-10-CM

## 2021-01-31 DIAGNOSIS — R7303 Prediabetes: Secondary | ICD-10-CM

## 2021-01-31 NOTE — Telephone Encounter (Signed)
-----   Message from [name] sent at 01/17/2021  8:20 AM EST ----- Regarding: Lab orders for Thursday, 1.26.23 Patient is scheduled for CPX labs, please order future labs, Thanks , Karna Christmas

## 2021-02-03 ENCOUNTER — Other Ambulatory Visit: Payer: Medicare Other

## 2021-02-03 NOTE — Progress Notes (Signed)
Subjective:   Alexandria Petersen is a 81 y.o. female who presents for Medicare Annual (Subsequent) preventive examination.  I connected with Alexandria Petersen today by telephone and verified that I am speaking with the correct person using two identifiers. Location patient: home Location provider: work Persons participating in the virtual visit: patient, Marine scientist.    I discussed the limitations, risks, security and privacy concerns of performing an evaluation and management service by telephone and the availability of in person appointments. I also discussed with the patient that there may be a patient responsible charge related to this service. The patient expressed understanding and verbally consented to this telephonic visit.    Interactive audio and video telecommunications were attempted between this provider and patient, however failed, due to patient having technical difficulties OR patient did not have access to video capability.  We continued and completed visit with audio only.  Some vital signs may be absent or patient reported.   Time Spent with patient on telephone encounter: 25 minutes  Review of Systems     Cardiac Risk Factors include: advanced age (>20men, >80 women);hypertension     Objective:    Today's Vitals   02/04/21 1352  Weight: 161 lb (73 kg)  Height: 5\' 4"  (1.626 m)   Body mass index is 27.64 kg/m.  Advanced Directives 02/04/2021 02/04/2020 09/03/2019 07/02/2019 02/24/2019 02/03/2019 01/28/2018  Does Patient Have a Medical Advance Directive? No No Yes Yes No No No  Type of Advance Directive - Public librarian;Living will [place];Living will - - -  Does patient want to make changes to medical advance directive? - - No - Patient declined No - Patient declined - - -  Copy of [place] in Chart? - - No - copy requested No - copy requested - - -  Would patient like information on creating a  medical advance directive? Yes (MAU/Ambulatory/Procedural Areas - Information given) No - Patient declined - - - Yes (MAU/Ambulatory/Procedural Areas - Information given) No - Patient declined    Current Medications (verified) Outpatient Encounter Medications as of 02/04/2021  Medication Sig   acetaminophen (TYLENOL) 650 MG CR tablet Take 650 mg by mouth every 8 (eight) hours as needed for pain.   hydrochlorothiazide (MICROZIDE) 12.5 MG capsule TAKE 1 CAPSULE BY MOUTH EVERY DAY   KLOR-CON M20 20 MEQ tablet    Lactobacillus Rhamnosus, GG, (CULTURELLE) CAPS    levothyroxine (SYNTHROID) 75 MCG tablet TAKE 1 TABLET BY MOUTH EVERY DAY EXCEPT ON SUNDAY ONLY TAKE 1&1/2 TABS   losartan (COZAAR) 100 MG tablet TAKE 1 TABLET BY MOUTH EVERY DAY   Multiple Vitamins-Minerals (CENTRUM SILVER ADULT 50+ PO) Take 1 tablet by mouth daily.   pravastatin (PRAVACHOL) 40 MG tablet TAKE 1 TABLET BY MOUTH EVERY DAY   No facility-administered encounter medications on file as of 02/04/2021.    Allergies (verified) Penicillins   History: Past Medical History:  Diagnosis Date   Arthritis    knee   HLD (hyperlipidemia)    Hypertension    Hypothyroidism    Osteoporosis    Wears dentures    Full upper and lower.  Only wears upper.   Past Surgical History:  Procedure Laterality Date   BREAST BIOPSY Left    CATARACT EXTRACTION W/PHACO Left 07/02/2019   Procedure: CATARACT EXTRACTION PHACO AND INTRAOCULAR LENS PLACEMENT (IOC) LEFT 4.51 00:56.4;  Surgeon: Leandrew Koyanagi, MD;  Location: [facility];  Service: Ophthalmology;  Laterality: Left;  CATARACT EXTRACTION W/PHACO Right 09/03/2019   Procedure: CATARACT EXTRACTION PHACO AND INTRAOCULAR LENS PLACEMENT (IOC) RIGHT DIABETIC 9.40 01:13.6 12.8 %;  Surgeon: Leandrew Koyanagi, MD;  Location: [facility];  Service: Ophthalmology;  Laterality: Right;   HEMORROIDECTOMY     ?   INTRAOCULAR LENS INSERTION Bilateral    ROTATOR CUFF REPAIR Right     TUBAL LIGATION     Family History  Problem Relation Age of Onset   Hypertension Mother    Diabetes Mother    Hypertension Father    Diabetes Sister    Diabetes Brother    Hypothyroidism Daughter    Heart disease Son    Hypothyroidism Son    Social History   Socioeconomic History   Marital status: Widowed    Spouse name: Not on file   Number of children: 3   Years of education: Not on file   Highest education level: Not on file  Occupational History   Occupation: retired day care worker    Employer: RETIRED  Tobacco Use   Smoking status: Never   Smokeless tobacco: Never  Vaping Use   Vaping Use: Never used  Substance and Sexual Activity   Alcohol use: No   Drug use: No   Sexual activity: Never  Other Topics Concern   Not on file  Social History Narrative   Regular exercise--minimal   She is working on setting up end of life planning.   No living will. Full code (reviewed 2013)               Social Determinants of Health   Financial Resource Strain: Low Risk    Difficulty of Paying Living Expenses: Not hard at all  Food Insecurity: No Food Insecurity   Worried About Charity fundraiser in the Last Year: Never true   Ran Out of Food in the Last Year: Never true  Transportation Needs: No Transportation Needs   Lack of Transportation (Medical): No   Lack of Transportation (Non-Medical): No  Physical Activity: Insufficiently Active   Days of Exercise per Week: 7 days   Minutes of Exercise per Session: 20 min  Stress: No Stress Concern Present   Feeling of Stress : Not at all  Social Connections: Moderately Integrated   Frequency of Communication with Friends and Family: More than three times a week   Frequency of Social Gatherings with Friends and Family: More than three times a week   Attends Religious Services: More than 4 times per year   Active Member of [employer] or Organizations: Yes   Attends Archivist Meetings: More than 4 times per year    Marital Status: Widowed    Tobacco Counseling Counseling given: Not Answered   Clinical Intake:  Pre-visit preparation completed: Yes  Pain : No/denies pain     BMI - recorded: 27.64 Nutritional Status: BMI 25 -29 Overweight Nutritional Risks: None Diabetes: No  How often do you need to have someone help you when you read instructions, pamphlets, or other written materials from your doctor or pharmacy?: 1 - Never  Diabetic? No  Interpreter Needed?: No  Information entered by :: Orrin Brigham LPN   Activities of Daily Living In your present state of health, do you have any difficulty performing the following activities: 02/04/2021  Hearing? N  Vision? Y  Difficulty concentrating or making decisions? N  Walking or climbing stairs? N  Dressing or bathing? N  Doing errands, shopping? N  Preparing Food and eating ?  N  Using the Toilet? N  In the past six months, have you accidently leaked urine? N  Do you have problems with loss of bowel control? N  Managing your Medications? N  Managing your Finances? N  Housekeeping or managing your Housekeeping? N  Some recent data might be hidden    Patient Care Team: Jinny Sanders, MD as PCP - General  Indicate any recent Medical Services you may have received from other than [facility] providers in the past year (date may be approximate).     Assessment:   This is a routine wellness examination for Alizandra.  Hearing/Vision screen Hearing Screening - Comments:: No issues  Vision Screening - Comments:: Last exam 2022, Dr. Gloriann Loan, wears glasses  Dietary issues and exercise activities discussed: Current Exercise Habits: Home exercise routine, Type of exercise: strength training/weights;stretching;walking, Time (Minutes): 15, Frequency (Times/Week): 7, Weekly Exercise (Minutes/Week): 105, Intensity: Mild   Goals Addressed             This Visit's Progress    Patient Stated       Would like to walk more, drink more water and  eat healthier        Depression Screen PHQ 2/9 Scores 02/04/2021 02/04/2020 02/03/2019 01/28/2018 12/25/2016 12/21/2015 12/10/2014  PHQ - 2 Score 0 0 0 0 0 0 0  PHQ- 9 Score - 0 0 0 1 - -    Fall Risk Fall Risk  02/04/2021 02/04/2020 02/03/2019 01/28/2018 12/25/2016  Falls in the past year? 0 0 0 0 No  Number falls in past yr: 0 0 0 - -  Injury with Fall? 0 0 0 - -  Risk for fall due to : No Fall Risks Medication side effect Medication side effect - -  Follow up Falls prevention discussed Falls evaluation completed;Falls prevention discussed Falls evaluation completed;Falls prevention discussed - -    FALL RISK PREVENTION PERTAINING TO THE HOME:  Any stairs in or around the home? Yes  If so, are there any without handrails? No  Home free of loose throw rugs in walkways, pet beds, electrical cords, etc? Yes  Adequate lighting in your home to reduce risk of falls? Yes    ASSISTIVE DEVICES UTILIZED TO PREVENT FALLS:  Life alert? No  Use of a cane, [place] or w/c? Yes , as needed  Grab bars in the bathroom? Yes  Shower chair or bench in shower? No  Elevated toilet seat or a handicapped toilet? Yes   TIMED UP AND GO:  Was the test performed? No .    Cognitive Function: Normal cognitive status assessed by direct observation by this Nurse Health Advisor. No abnormalities found.   MMSE - Mini Mental State Exam 02/04/2020 02/03/2019 01/28/2018 12/25/2016  Orientation to time 5 5 5 5   Orientation to Place 5 5 5 5   Registration 3 3 3 3   Attention/ Calculation 5 5 0 0  Recall 3 2 1 1   Recall-comments - - unable to recall 2 of 3 words unable to recall 2 of 3 words  Language- name 2 objects - - 0 0  Language- repeat 1 1 1 1   Language- follow 3 step command - - 3 3  Language- read & follow direction - - 0 0  Write a sentence - - 0 0  Copy design - - 0 0  Total score - - 18 18        Immunizations Immunization History  Administered Date(s) Administered   Fluad Quad(high Dose 65+)  09/17/2018, 10/07/2019   Influenza Whole 10/16/2008, 10/06/2009   Influenza, High Dose Seasonal PF 10/28/2015, 10/26/2016, 10/13/2020   Influenza,inj,Quad PF,6+ Mos 11/05/2012, 10/30/2013, 10/30/2014, 10/25/2017   PFIZER(Purple Top)SARS-COV-2 Vaccination 02/15/2019, 03/12/2019, 11/21/2019   PPD Test 04/21/2014   Pneumococcal Conjugate-13 12/10/2014   Pneumococcal Polysaccharide-23 10/26/2009   Td 08/29/2005   Tdap 04/05/2010   Zoster, Live 11/09/2009    TDAP status: Due, Education has been provided regarding the importance of this vaccine. Advised may receive this vaccine at local pharmacy or Health Dept. Aware to provide a copy of the vaccination record if obtained from local pharmacy or Health Dept. Verbalized acceptance and understanding.  Flu Vaccine status: Up to date  Pneumococcal vaccine status: Up to date  Covid-19 vaccine status: Information provided on how to obtain vaccines.   Qualifies for Shingles Vaccine? Yes   Zostavax completed Yes   Shingrix Completed?: No.    Education has been provided regarding the importance of this vaccine. Patient has been advised to call insurance company to determine out of pocket expense if they have not yet received this vaccine. Advised may also receive vaccine at local pharmacy or Health Dept. Verbalized acceptance and understanding.  Screening Tests Health Maintenance  Topic Date Due   Zoster Vaccines- Shingrix (1 of 2) Never done   COVID-19 Vaccine (4 - Booster for Pfizer series) 01/16/2020   TETANUS/TDAP  04/04/2020   MAMMOGRAM  03/19/2021   DEXA SCAN  02/23/2022   Pneumonia Vaccine 82+ Years old  Completed   INFLUENZA VACCINE  Completed   HPV VACCINES  Aged Out    Health Maintenance  Health Maintenance Due  Topic Date Due   Zoster Vaccines- Shingrix (1 of 2) Never done   COVID-19 Vaccine (4 - Booster for Pfizer series) 01/16/2020   TETANUS/TDAP  04/04/2020    Colorectal cancer screening: No longer required.   Mammogram  status: Completed 09/19/20. Repeat every year  Bone Density status: Ordered 02/04/21. Pt provided with contact info and advised to call to schedule appt.  Lung Cancer Screening: (Low Dose CT Chest recommended if Age 63-80 years, 30 pack-year currently smoking OR have quit w/in 15years.) does not qualify.     Additional Screening:  Hepatitis C Screening: does not qualify  Vision Screening: Recommended annual ophthalmology exams for early detection of glaucoma and other disorders of the eye. Is the patient up to date with their annual eye exam?  Yes  Who is the provider or what is the name of the office in which the patient attends annual eye exams? Dr. Gloriann Loan   Dental Screening: Recommended annual dental exams for proper oral hygiene  Community Resource Referral / Chronic Care Management: CRR required this visit?  No   CCM required this visit?  No      Plan:     I have personally reviewed and noted the following in the patients chart:   Medical and social history Use of alcohol, tobacco or illicit drugs  Current medications and supplements including opioid prescriptions.  Functional ability and status Nutritional status Physical activity Advanced directives List of other physicians Hospitalizations, surgeries, and ER visits in previous 12 months Vitals Screenings to include cognitive, depression, and falls Referrals and appointments  In addition, I have reviewed and discussed with patient certain preventive protocols, quality metrics, and best practice recommendations. A written personalized care plan for preventive services as well as general preventive health recommendations were provided to patient.   Due to this being a telephonic visit, the after  visit summary with patients personalized plan was offered to patient via mail or my-chart. Patient would like to access on my-chart.   Loma Messing, LPN   [id number]   Nurse Health Advisor  Nurse Notes:  none

## 2021-02-04 ENCOUNTER — Ambulatory Visit (INDEPENDENT_AMBULATORY_CARE_PROVIDER_SITE_OTHER): Payer: Medicare Other

## 2021-02-04 VITALS — Ht 64.0 in | Wt 161.0 lb

## 2021-02-04 DIAGNOSIS — Z780 Asymptomatic menopausal state: Secondary | ICD-10-CM | POA: Diagnosis not present

## 2021-02-04 DIAGNOSIS — Z1231 Encounter for screening mammogram for malignant neoplasm of breast: Secondary | ICD-10-CM

## 2021-02-04 DIAGNOSIS — Z0000 Encounter for general adult medical examination without abnormal findings: Secondary | ICD-10-CM | POA: Diagnosis not present

## 2021-02-04 NOTE — Patient Instructions (Signed)
[name] , Thank you for taking time to complete your Medicare Wellness Visit. I appreciate your ongoing commitment to your health goals. Please review the following plan we discussed and let me know if I can assist you in the future.   Screening recommendations/referrals: Colonoscopy: no longer required  Mammogram: up to date, completed 03/19/20, due 03/19/21, scheduled today, someone will call to schedule an appointment.  Bone Density: up to date, Recommended yearly ophthalmology/optometry visit for glaucoma screening and checkup Recommended yearly dental visit for hygiene and checkup  Vaccinations: Influenza vaccine: up to date Pneumococcal vaccine: up to date Tdap vaccine: due, last completed 04/05/10, discuss with your local pharmacy  Shingles vaccine: Discuss with your local pharmacy   Covid-19:newest booster available at your local pharmacy  Advanced directives: Please bring a copy of Living Will and/or Healthcare Power of Attorney for your chart.   Conditions/risks identified: see problem list   Next appointment: Follow up in one year for your annual wellness visit 02/06/22 @ 2:00pm , this will be a telephone visit.    Preventive Care 65 Years and Older, Female Preventive care refers to lifestyle choices and visits with your health care provider that can promote health and wellness. What does preventive care include? A yearly physical exam. This is also called an annual well check. Dental exams once or twice a year. Routine eye exams. Ask your health care provider how often you should have your eyes checked. Personal lifestyle choices, including: Daily care of your teeth and gums. Regular physical activity. Eating a healthy diet. Avoiding tobacco and drug use. Limiting alcohol use. Practicing safe sex. Taking low-dose aspirin every day. Taking vitamin and mineral supplements as recommended by your health care provider. What happens during an annual well check? The  services and screenings done by your health care provider during your annual well check will depend on your age, overall health, lifestyle risk factors, and family history of disease. Counseling  Your health care provider may ask you questions about your: Alcohol use. Tobacco use. Drug use. Emotional well-being. Home and relationship well-being. Sexual activity. Eating habits. History of falls. Memory and ability to understand (cognition). Work and work Statistician. Reproductive health. Screening  You may have the following tests or measurements: Height, weight, and BMI. Blood pressure. Lipid and cholesterol levels. These may be checked every 5 years, or more frequently if you are over 83 years old. Skin check. Lung cancer screening. You may have this screening every year starting at age 83 if you have a 30-pack-year history of smoking and currently smoke or have quit within the past 15 years. Fecal occult blood test (FOBT) of the stool. You may have this test every year starting at age 83. Flexible sigmoidoscopy or colonoscopy. You may have a sigmoidoscopy every 5 years or a colonoscopy every 10 years starting at age 83. Hepatitis C blood test. Hepatitis B blood test. Sexually transmitted disease (STD) testing. Diabetes screening. This is done by checking your blood sugar (glucose) after you have not eaten for a while (fasting). You may have this done every 1-3 years. Bone density scan. This is done to screen for osteoporosis. You may have this done starting at age 83. Mammogram. This may be done every 1-2 years. Talk to your health care provider about how often you should have regular mammograms. Talk with your health care provider about your test results, treatment options, and if necessary, the need for more tests. Vaccines  Your health care provider may recommend certain  vaccines, such as: Influenza vaccine. This is recommended every year. Tetanus, diphtheria, and acellular  pertussis (Tdap, Td) vaccine. You may need a Td booster every 10 years. Zoster vaccine. You may need this after age 83. Pneumococcal 13-valent conjugate (PCV13) vaccine. One dose is recommended after age 83. Pneumococcal polysaccharide (PPSV23) vaccine. One dose is recommended after age 83. Talk to your health care provider about which screenings and vaccines you need and how often you need them. This information is not intended to replace advice given to you by your health care provider. Make sure you discuss any questions you have with your health care provider. Document Released: 01/22/2015 Document Revised: 09/15/2015 Document Reviewed: 10/27/2014 Elsevier Interactive Patient Education  2017 [employer] Prevention in the Home Falls can cause injuries. They can happen to people of all ages. There are many things you can do to make your home safe and to help prevent falls. What can I do on the outside of my home? Regularly fix the edges of walkways and driveways and fix any cracks. Remove anything that might make you trip as you walk through a door, such as a raised step or threshold. Trim any bushes or trees on the path to your home. Use bright outdoor lighting. Clear any walking paths of anything that might make someone trip, such as rocks or tools. Regularly check to see if handrails are loose or broken. Make sure that both sides of any steps have handrails. Any raised decks and porches should have guardrails on the edges. Have any leaves, snow, or ice cleared regularly. Use sand or salt on walking paths during winter. Clean up any spills in your garage right away. This includes oil or grease spills. What can I do in the bathroom? Use night lights. Install grab bars by the toilet and in the tub and shower. Do not use towel bars as grab bars. Use non-skid mats or decals in the tub or shower. If you need to sit down in the shower, use a plastic, non-slip stool. Keep the floor  dry. Clean up any water that spills on the floor as soon as it happens. Remove soap buildup in the tub or shower regularly. Attach bath mats securely with double-sided non-slip rug tape. Do not have throw rugs and other things on the floor that can make you trip. What can I do in the bedroom? Use night lights. Make sure that you have a light by your bed that is easy to reach. Do not use any sheets or blankets that are too big for your bed. They should not hang down onto the floor. Have a firm chair that has side arms. You can use this for support while you get dressed. Do not have throw rugs and other things on the floor that can make you trip. What can I do in the kitchen? Clean up any spills right away. Avoid walking on wet floors. Keep items that you use a lot in easy-to-reach places. If you need to reach something above you, use a strong step stool that has a grab bar. Keep electrical cords out of the way. Do not use floor polish or wax that makes floors slippery. If you must use wax, use non-skid floor wax. Do not have throw rugs and other things on the floor that can make you trip. What can I do with my stairs? Do not leave any items on the stairs. Make sure that there are handrails on both sides of the stairs  and use them. Fix handrails that are broken or loose. Make sure that handrails are as long as the stairways. Check any carpeting to make sure that it is firmly attached to the stairs. Fix any carpet that is loose or worn. Avoid having throw rugs at the top or bottom of the stairs. If you do have throw rugs, attach them to the floor with carpet tape. Make sure that you have a light switch at the top of the stairs and the bottom of the stairs. If you do not have them, ask someone to add them for you. What else can I do to help prevent falls? Wear shoes that: Do not have high heels. Have rubber bottoms. Are comfortable and fit you well. Are closed at the toe. Do not wear  sandals. If you use a stepladder: Make sure that it is fully opened. Do not climb a closed stepladder. Make sure that both sides of the stepladder are locked into place. Ask someone to hold it for you, if possible. Clearly mark and make sure that you can see: Any grab bars or handrails. First and last steps. Where the edge of each step is. Use tools that help you move around (mobility aids) if they are needed. These include: Canes. Walkers. Scooters. Crutches. Turn on the lights when you go into a dark area. Replace any light bulbs as soon as they burn out. Set up your furniture so you have a clear path. Avoid moving your furniture around. If any of your floors are uneven, fix them. If there are any pets around you, be aware of where they are. Review your medicines with your doctor. Some medicines can make you feel dizzy. This can increase your chance of falling. Ask your doctor what other things that you can do to help prevent falls. This information is not intended to replace advice given to you by your health care provider. Make sure you discuss any questions you have with your health care provider. Document Released: 10/22/2008 Document Revised: 06/03/2015 Document Reviewed: 01/30/2014 Elsevier Interactive Patient Education  2017 [employer].

## 2021-02-10 ENCOUNTER — Encounter: Payer: Medicare Other | Admitting: Family Medicine

## 2021-03-28 ENCOUNTER — Other Ambulatory Visit: Payer: Self-pay | Admitting: Family Medicine

## 2021-04-21 ENCOUNTER — Ambulatory Visit (INDEPENDENT_AMBULATORY_CARE_PROVIDER_SITE_OTHER): Payer: Medicare Other | Admitting: Family Medicine

## 2021-04-21 VITALS — BP 136/72 | HR 106 | Temp 97.6°F | Resp 12 | Ht 61.5 in | Wt 156.2 lb

## 2021-04-21 DIAGNOSIS — M170 Bilateral primary osteoarthritis of knee: Secondary | ICD-10-CM | POA: Diagnosis not present

## 2021-04-21 DIAGNOSIS — E7800 Pure hypercholesterolemia, unspecified: Secondary | ICD-10-CM | POA: Diagnosis not present

## 2021-04-21 DIAGNOSIS — Z0000 Encounter for general adult medical examination without abnormal findings: Secondary | ICD-10-CM

## 2021-04-21 DIAGNOSIS — E890 Postprocedural hypothyroidism: Secondary | ICD-10-CM

## 2021-04-21 DIAGNOSIS — Z1231 Encounter for screening mammogram for malignant neoplasm of breast: Secondary | ICD-10-CM | POA: Diagnosis not present

## 2021-04-21 DIAGNOSIS — I10 Essential (primary) hypertension: Secondary | ICD-10-CM

## 2021-04-21 DIAGNOSIS — R7303 Prediabetes: Secondary | ICD-10-CM | POA: Diagnosis not present

## 2021-04-21 DIAGNOSIS — Z780 Asymptomatic menopausal state: Secondary | ICD-10-CM | POA: Diagnosis not present

## 2021-04-21 LAB — LIPID PANEL
Cholesterol: 234 mg/dL — ABNORMAL HIGH (ref 0–200)
HDL: 102.4 mg/dL (ref >39.00)
LDL Cholesterol: 110 mg/dL — ABNORMAL HIGH (ref 0–99)
NonHDL: 131.69
Total CHOL/HDL Ratio: 2
Triglycerides: 108 mg/dL (ref 0.0–149.0)
VLDL: 21.6 mg/dL (ref 0.0–40.0)

## 2021-04-21 LAB — HEMOGLOBIN A1C: Hgb A1c MFr Bld: 6 % (ref 4.6–6.5)

## 2021-04-21 LAB — COMPREHENSIVE METABOLIC PANEL
ALT: 15 U/L (ref 0–35)
AST: 21 U/L (ref 0–37)
Albumin: 4.4 g/dL (ref 3.5–5.2)
Alkaline Phosphatase: 47 U/L (ref 39–117)
BUN: 15 mg/dL (ref 6–23)
CO2: 27 mEq/L (ref 19–32)
Calcium: 10.1 mg/dL (ref 8.4–10.5)
Chloride: 103 mEq/L (ref 96–112)
Creatinine, Ser: 0.97 mg/dL (ref 0.40–1.20)
GFR: 54.46 mL/min — ABNORMAL LOW (ref >60.00)
Glucose, Bld: 160 mg/dL — ABNORMAL HIGH (ref 70–99)
Potassium: 3.7 mEq/L (ref 3.5–5.1)
Sodium: 139 mEq/L (ref 135–145)
Total Bilirubin: 1.2 mg/dL (ref 0.2–1.2)
Total Protein: 7.1 g/dL (ref 6.0–8.3)

## 2021-04-21 LAB — TSH: TSH: 1.61 u[IU]/mL (ref 0.35–5.50)

## 2021-04-21 LAB — T4, FREE: Free T4: 1.25 ng/dL (ref 0.60–1.60)

## 2021-04-21 LAB — T3, FREE: T3, Free: 3.1 pg/mL (ref 2.3–4.2)

## 2021-04-21 NOTE — Assessment & Plan Note (Addendum)
Chronic, moderate control ? ? using Voltaren or Bengay... this helps some. ?Recommended follow-up with Dr. Jackie Plum and sports medicine if pain is not well controlled for consideration of repeat steroid injection versus hyaluronic acid injections. ?He states she is not ready to move forward with these at this time. ?She uses a cane for stability ?

## 2021-04-21 NOTE — Patient Instructions (Addendum)
Please stop at the lab to have labs drawn. ? Consider Shingrix vaccine and bivalent COVID booster at pharmacy. ? ?

## 2021-04-21 NOTE — Assessment & Plan Note (Signed)
Due for reevaluation 

## 2021-04-21 NOTE — Progress Notes (Signed)
? ? Patient ID: Alexandria Petersen, female    DOB: [birth year], 81 y.o.   MRN: [id number] ? ?This visit was conducted in person. ? ?BP (!) 164/60   Pulse (!) 106   Temp 97.6 ?F (36.4 ?C)   Resp 12   Ht 5' 1.5" (1.562 m)   Wt 156 lb 3 oz (70.8 kg)   SpO2 99%   BMI 29.03 kg/m?   ? ?CC:  ?Chief Complaint  ?Patient presents with  ? Annual Exam  ?  Part 2  ? ? ?Subjective:  ? ?HPI: ?Alexandria Petersen is a 81 y.o. female presenting on 04/21/2021 for Annual Exam (Part 2) ? ?The patient presents for  complete physical and review of chronic health problems. He/She also has the following acute concerns today: ? ?The patient saw a LPN or RN for medicare wellness visit. ? Prevention and wellness was reviewed in detail. ?Note reviewed and important notes copied below. ? ?04/21/21 ? ?Hypertension:  Blood pressure not at goal in office today but she does have a history of whitecoat hypertension.  At home her measurements run with insurance visit 136/72 ?On losartan hydrochlorothiazide ?BP Readings from Last 3 Encounters:  ?04/21/21 (!) 164/60  ?09/09/20 (!) 184/80  ?03/08/20 (!) 142/72  ?Using medication without problems or lightheadedness: none ?Chest pain with exertion: none ?Edema: none ?Short of breath: none ?Average home BPs:  130-136/70-80 ?Other issues: ? ?Elevated Cholesterol: LDL tolerable control on pravastatin 40 mg daily Due for re-eval. ?Using medications without problems: ?Muscle aches:  ?Diet compliance: moderate ?Exercise: walking ?Other complaints: ?Wt Readings from Last 3 Encounters:  ?04/21/21 156 lb 3 oz (70.8 kg)  ?02/04/21 161 lb (73 kg)  ?09/09/20 161 lb 3.2 oz (73.1 kg)  ? ? ? ?Prediabetes  Due for re-eval ?Lab Results  ?Component Value Date  ? HGBA1C 5.9 02/04/2020  ? ?Postablative hypothyroid Stable.. due for re-eval. Followed by Dr. Dwyane Dee. ?Lab Results  ?Component Value Date  ? TSH 1.33 09/09/2020  ? ? ? ?   ? ?Relevant past medical, surgical, family and social history reviewed and  updated as indicated. Interim medical history since our last visit reviewed. ?Allergies and medications reviewed and updated. ?Outpatient Medications Prior to Visit  ?Medication Sig Dispense Refill  ? acetaminophen (TYLENOL) 650 MG CR tablet Take 650 mg by mouth every 8 (eight) hours as needed for pain.    ? hydrochlorothiazide (MICROZIDE) 12.5 MG capsule TAKE 1 CAPSULE BY MOUTH EVERY DAY 90 capsule 0  ? KLOR-CON M20 20 MEQ tablet     ? Lactobacillus Rhamnosus, GG, (CULTURELLE) CAPS     ? levothyroxine (SYNTHROID) 75 MCG tablet TAKE 1 TABLET BY MOUTH EVERY DAY EXCEPT ON SUNDAY ONLY TAKE 1&1/2 TABS 96 tablet 1  ? losartan (COZAAR) 100 MG tablet TAKE 1 TABLET BY MOUTH EVERY DAY 90 tablet 0  ? Multiple Vitamins-Minerals (CENTRUM SILVER ADULT 50+ PO) Take 1 tablet by mouth daily.    ? pravastatin (PRAVACHOL) 40 MG tablet TAKE 1 TABLET BY MOUTH EVERY DAY 90 tablet 0  ? ?No facility-administered medications prior to visit.  ?  ? ?Per HPI unless specifically indicated in ROS section below ?Review of Systems  ?Constitutional:  Negative for fatigue and fever.  ?HENT:  Negative for congestion.   ?Eyes:  Negative for pain.  ?Respiratory:  Negative for cough and shortness of breath.   ?Cardiovascular:  Negative for chest pain, palpitations and leg swelling.  ?Gastrointestinal:  Negative for abdominal pain.  ?Genitourinary:  Negative  for dysuria and vaginal bleeding.  ?Musculoskeletal:  Negative for back pain.  ?Neurological:  Negative for syncope, light-headedness and headaches.  ?Psychiatric/Behavioral:  Negative for dysphoric mood.   ?Objective:  ?BP (!) 164/60   Pulse (!) 106   Temp 97.6 ?F (36.4 ?C)   Resp 12   Ht 5' 1.5" (1.562 m)   Wt 156 lb 3 oz (70.8 kg)   SpO2 99%   BMI 29.03 kg/m?   ?Wt Readings from Last 3 Encounters:  ?04/21/21 156 lb 3 oz (70.8 kg)  ?02/04/21 161 lb (73 kg)  ?09/09/20 161 lb 3.2 oz (73.1 kg)  ?  ?  ?Physical Exam ?Vitals and nursing note reviewed.  ?Constitutional:   ?   General: She is not  in acute distress. ?   Appearance: Normal appearance. She is well-developed. She is not ill-appearing or toxic-appearing.  ?HENT:  ?   Head: Normocephalic.  ?   Right Ear: Hearing, tympanic membrane, ear canal and external ear normal.  ?   Left Ear: Hearing, tympanic membrane, ear canal and external ear normal.  ?   Nose: Nose normal.  ?Eyes:  ?   General: Lids are normal. Lids are everted, no foreign bodies appreciated.  ?   Conjunctiva/sclera: Conjunctivae normal.  ?   Pupils: Pupils are equal, round, and reactive to light.  ?Neck:  ?   Thyroid: No thyroid mass or thyromegaly.  ?   Vascular: No carotid bruit.  ?   Trachea: Trachea normal.  ?Cardiovascular:  ?   Rate and Rhythm: Normal rate and regular rhythm.  ?   Heart sounds: Normal heart sounds, S1 normal and S2 normal. No murmur heard. ?  No gallop.  ?Pulmonary:  ?   Effort: Pulmonary effort is normal. No respiratory distress.  ?   Breath sounds: Normal breath sounds. No wheezing, rhonchi or rales.  ?Abdominal:  ?   General: Bowel sounds are normal. There is no distension or abdominal bruit.  ?   Palpations: Abdomen is soft. There is no fluid wave or mass.  ?   Tenderness: There is no abdominal tenderness. There is no guarding or rebound.  ?   Hernia: No hernia is present.  ?Musculoskeletal:  ?   Cervical back: Normal range of motion and neck supple.  ?Lymphadenopathy:  ?   Cervical: No cervical adenopathy.  ?Skin: ?   General: Skin is warm and dry.  ?   Findings: No rash.  ?Neurological:  ?   Mental Status: She is alert.  ?   Cranial Nerves: No cranial nerve deficit.  ?   Sensory: No sensory deficit.  ?Psychiatric:     ?   Mood and Affect: Mood is not anxious or depressed.     ?   Speech: Speech normal.     ?   Behavior: Behavior normal. Behavior is cooperative.     ?   Judgment: Judgment normal.  ? ?   ?Results for orders placed or performed in visit on 09/09/20  ?T4, free  ?Result Value Ref Range  ? Free T4 0.97 0.60 - 1.60 ng/dL  ?TSH  ?Result Value Ref  Range  ? TSH 1.33 0.35 - 5.50 uIU/mL  ? ? ?This visit occurred during the SARS-CoV-2 public health emergency.  Safety protocols were in place, including screening questions prior to the visit, additional usage of staff PPE, and extensive cleaning of exam room while observing appropriate contact time as indicated for disinfecting solutions.  ? ?COVID 19 screen:  No recent travel or known exposure to [place] ?The patient denies respiratory symptoms of COVID 19 at this time. ?The importance of social distancing was discussed today.  ? ?Assessment and Plan ?The patient's preventative maintenance and recommended screening tests for an annual wellness exam were reviewed in full today. ?Brought up to date unless services declined. ? ?Counselled on the importance of diet, exercise, and its role in overall health and mortality. ?The patient's FH and SH was reviewed, including their home life, tobacco status, and drug and alcohol status.  ? ?Vaccines: uptodate Td, flu,  COVID vaccine x 3 , Rx for shingrix given in past.. reminded, consider bivalent COVID booster ?Pap/DVE:  No pap indicated ?Mammo:  nml 2022, has appt for7/06/2021 ?Bone Density: 02/2017 osteopenia, repeat scheduled for 07/2021 ?Colon: yearly stool cards.. neg 01/2017, no further indicated ?Smoking Status: nonsmoker ? ?Problem List Items Addressed This Visit   ? ? HYPERCHOLESTEROLEMIA, PURE (Chronic)  ? HYPERTENSION, BENIGN ESSENTIAL (Chronic)  ?  Chronic, stable ? ?Measurement in office always is elevated.  At home at recent insurance office visit with trusted cuff her blood pressure was in the normal range.  She also states when she checks it herself at home it is in the normal range. ? ?HCTZ 12.5 mg daily ?Losartan 100 mg p.o. daily ?  ?  ? Osteoarthritis of both knees (Chronic)  ?  Chronic, moderate control ? ? using Voltaren or Bengay... this helps some. ?Recommended follow-up with Dr. Jackie Plum and sports medicine if pain is not well controlled for consideration  of repeat steroid injection versus hyaluronic acid injections. ?He states she is not ready to move forward with these at this time. ?She uses a cane for stability ?  ?  ? Postablative hypothyroidism (Chronic)  ?  Ch

## 2021-04-21 NOTE — Assessment & Plan Note (Addendum)
Chronic, stable ? ?Measurement in office always is elevated.  At home at recent insurance office visit with trusted cuff her blood pressure was in the normal range.  She also states when she checks it herself at home it is in the normal range. ? ?HCTZ 12.5 mg daily ?Losartan 100 mg p.o. daily ?

## 2021-04-21 NOTE — Assessment & Plan Note (Signed)
Chronic, due for reevaluation ?Followed by Dr. Dwyane Dee Endo. ? ? ?

## 2021-04-28 ENCOUNTER — Ambulatory Visit (INDEPENDENT_AMBULATORY_CARE_PROVIDER_SITE_OTHER): Payer: Medicare Other | Admitting: Endocrinology

## 2021-04-28 ENCOUNTER — Encounter: Payer: Self-pay | Admitting: Endocrinology

## 2021-04-28 VITALS — BP 182/78 | HR 116 | Ht 64.0 in | Wt 159.2 lb

## 2021-04-28 DIAGNOSIS — E890 Postprocedural hypothyroidism: Secondary | ICD-10-CM

## 2021-04-28 NOTE — Progress Notes (Signed)
Patient ID: Alexandria Petersen, female   DOB: 05/31/1940, 81 y.o.   MRN: [id number] ? ?                                                                                              ? ?             ? ?Reason for Appointment: Hypothyroidism, follow up ? ? ? ? History of Present Illness:  ? ?Prior history: ?When she was first seen in 12/16 she had symptoms of weight loss,  palpitations, feeling tired, occasionally feeling warm and some weakness in her legs. Also was having decreased appetite. ? She previously had suppressed TSH levels since at least 10/2013 ?Pretreatment labs showed increased free T4 and free T3 levels in 12/16 ? She was also found to have a goiter on exam initially. ? ?Was started on Methimazole 5 mg twice a day in 12/2014  ? ?Her methimazole was tapered off and stopped in 09/2015 when her free T4 was low normal ?However without her methimazole she had a recurrence of her hyperthyroidism in 1/18; she was having symptoms of palpitations and fatigue ?She finally had her I-131 treatment on 05/02/16 with 18.1 mCi ? ? ?RECENT history: ? ?She had become hypothyroid in 07/2016 when she was having fatigue, increased weight and hoarseness ?She has been started on levothyroxine, initially 112 ?g and then this was reduced gradually ? ?She has been on 75 mcg since 2019 and taking extra half tablet weekly when TSH was 8.1 in 5/20 ? ?This gives her an average of 80 mcg daily with this regimen ? ?Overall she feels fairly good with no unusual fatigue ?No significant weight change ? ?She has been regular with taking her levothyroxine about 1/2 hour before breakfast daily ?Has been quite consistent with this ? ?TSH is consistently normal now ? ?Wt Readings from Last 3 Encounters:  ?04/28/21 159 lb 3.2 oz (72.2 kg)  ?04/21/21 156 lb 3 oz (70.8 kg)  ?02/04/21 161 lb (73 kg)  ? ?Lab Results  ?Component Value Date  ? TSH 1.61 04/21/2021  ? TSH 1.33 09/09/2020  ? TSH 3.85 02/04/2020  ? FREET4 1.25 04/21/2021  ? FREET4  0.97 09/09/2020  ? FREET4 1.14 02/04/2020  ? ? ? ? ? ?Allergies as of 04/28/2021   ? ?   Reactions  ? Penicillins   ? REACTION: hallucinations  ? ?  ? ?  ?Medication List  ?  ? ?  ? Accurate as of April 28, 2021 11:26 AM. If you have any questions, ask your nurse or doctor.  ?  ?  ? ?  ? ?acetaminophen 650 MG CR tablet ?Commonly known as: TYLENOL ?Take 650 mg by mouth every 8 (eight) hours as needed for pain. ?  ?CENTRUM SILVER ADULT 50+ PO ?Take 1 tablet by mouth daily. ?  ?Culturelle Caps ?  ?hydrochlorothiazide 12.5 MG capsule ?Commonly known as: MICROZIDE ?TAKE 1 CAPSULE BY MOUTH EVERY DAY ?  ?Klor-Con M20 20 MEQ tablet ?Generic drug: potassium chloride SA ?  ?levothyroxine 75 MCG tablet ?Commonly known as: SYNTHROID ?TAKE 1 TABLET BY  MOUTH EVERY DAY EXCEPT ON SUNDAY ONLY TAKE 1&1/2 TABS ?  ?losartan 100 MG tablet ?Commonly known as: COZAAR ?TAKE 1 TABLET BY MOUTH EVERY DAY ?  ?pravastatin 40 MG tablet ?Commonly known as: PRAVACHOL ?TAKE 1 TABLET BY MOUTH EVERY DAY ?  ? ?  ?     ? ?Past Medical History:  ?Diagnosis Date  ? Arthritis   ? knee  ? HLD (hyperlipidemia)   ? Hypertension   ? Hypothyroidism   ? Osteoporosis   ? Wears dentures   ? Full upper and lower.  Only wears upper.  ? ? ?Past Surgical History:  ?Procedure Laterality Date  ? BREAST BIOPSY Left   ? CATARACT EXTRACTION W/PHACO Left 07/02/2019  ? Procedure: CATARACT EXTRACTION PHACO AND INTRAOCULAR LENS PLACEMENT ([place]) LEFT 4.51 00:56.4;  Surgeon: Leandrew Koyanagi, MD;  Location: [facility];  Service: Ophthalmology;  Laterality: Left;  ? CATARACT EXTRACTION W/PHACO Right 09/03/2019  ? Procedure: CATARACT EXTRACTION PHACO AND INTRAOCULAR LENS PLACEMENT ([facility]) RIGHT DIABETIC 9.40 01:13.6 12.8 %;  Surgeon: Leandrew Koyanagi, MD;  Location: [facility];  Service: Ophthalmology;  Laterality: Right;  ? HEMORROIDECTOMY    ? ?  ? INTRAOCULAR LENS INSERTION Bilateral   ? ROTATOR CUFF REPAIR Right   ? TUBAL LIGATION    ? ? ?Family History   ?Problem Relation Age of Onset  ? Hypertension Mother   ? Diabetes Mother   ? Hypertension Father   ? Diabetes Sister   ? Diabetes Brother   ? Hypothyroidism Daughter   ? Heart disease Son   ? Hypothyroidism Son   ? ? ?Social History:  reports that she has never smoked. She has never used smokeless tobacco. She reports that she does not drink alcohol and does not use drugs. ? ?Allergies:  ?Allergies  ?Allergen Reactions  ? Penicillins   ?  REACTION: hallucinations  ? ? ? ?Review of Systems ? ?Blood pressure is being treated with losartan and HCTZ ? ?Hypertension has been managed by her PCP ?She is followed by her PCP ? ?She is taking her blood pressure regularly at home and her blood pressure at home is about 716-967 systolic  ? ?She usually has significant whitecoat hypertension and blood pressure was significantly high along with pulse rate ? ?BP Readings from Last 3 Encounters:  ?04/28/21 (!) 182/78  ?04/21/21 136/72  ?09/09/20 (!) 184/80  ? ? ?She has impaired fasting glucose followed by her PCP ?Nonfasting glucose 160 ? ?Lab Results  ?Component Value Date  ? HGBA1C 6.0 04/21/2021  ? HGBA1C 5.9 02/04/2020  ? HGBA1C 5.8 01/29/2019  ? ?Lab Results  ?Component Value Date  ? LDLCALC 110 (H) 04/21/2021  ? CREATININE 0.97 04/21/2021  ? ? ? ? Examination: ?  ?BP (!) 182/78   Pulse (!) 116   Ht '5\' 4"'$  (1.626 m)   Wt 159 lb 3.2 oz (72.2 kg)   SpO2 98%   BMI 27.33 kg/m?  ? ?Thyroid not palpable and no lymphadenopathy in the neck ? ? Assessment/Plan: ? ?Post-ablative hypothyroidism since 07/2016 ? ?She is taking the equivalent of 80 mcg of levothyroxine since 2020 ?She is not complaining of any unusual fatigue ? ?Her TSH is now consistently normal ?She can now come back annually for follow-up ? ?Reassured her that she does not have any swelling in her right neck as she was asking about ?To follow-up with her PCP regarding her lipids and glucose ?  ? ?There are no Patient Instructions on file for  this visit. ?   ? ? ?Elayne Snare ?04/28/2021, 11:26 AM  ? ?Note: This office note was prepared with Estate agent. Any transcriptional errors that result from this process are unintentional. ? ? ? ?  ?

## 2021-05-29 ENCOUNTER — Other Ambulatory Visit: Payer: Self-pay | Admitting: Endocrinology

## 2021-06-01 ENCOUNTER — Other Ambulatory Visit: Payer: Self-pay | Admitting: Family Medicine

## 2021-06-14 IMAGING — MG DIGITAL SCREENING BILAT W/ TOMO W/ CAD
8 series · 8 of 24 positions shown · non-contrast
Comparison: Previous exam(s).

CLINICAL DATA: Screening.

EXAM:
DIGITAL SCREENING BILATERAL MAMMOGRAM WITH TOMO AND CAD

[R MLO synth-2D]
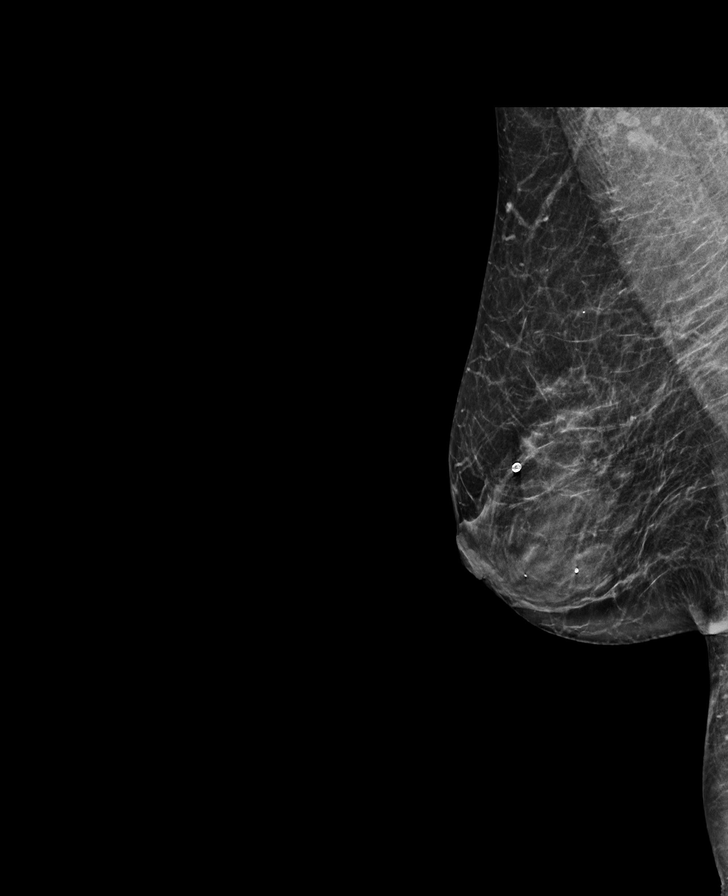

[L MLO synth-2D]
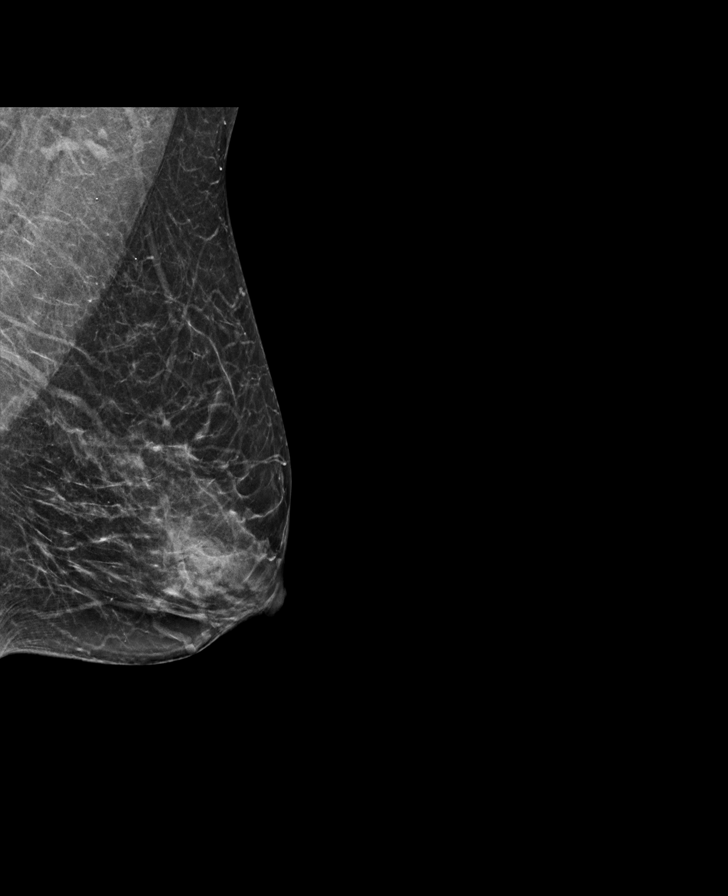

[L CC synth-2D]
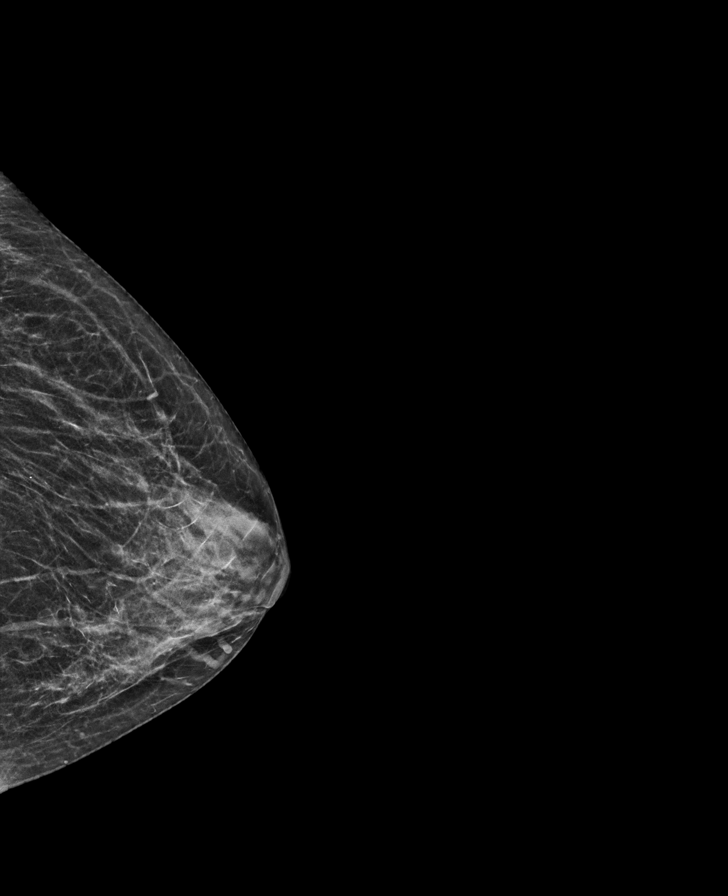

[R CC synth-2D]
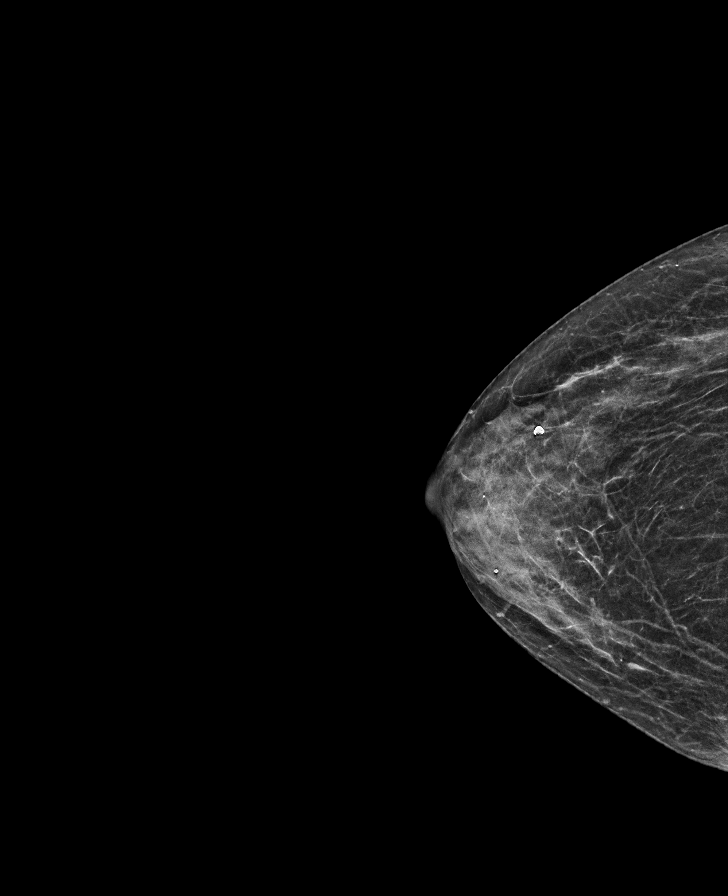

[L MLO tomo · tomo slice 28/55.0]
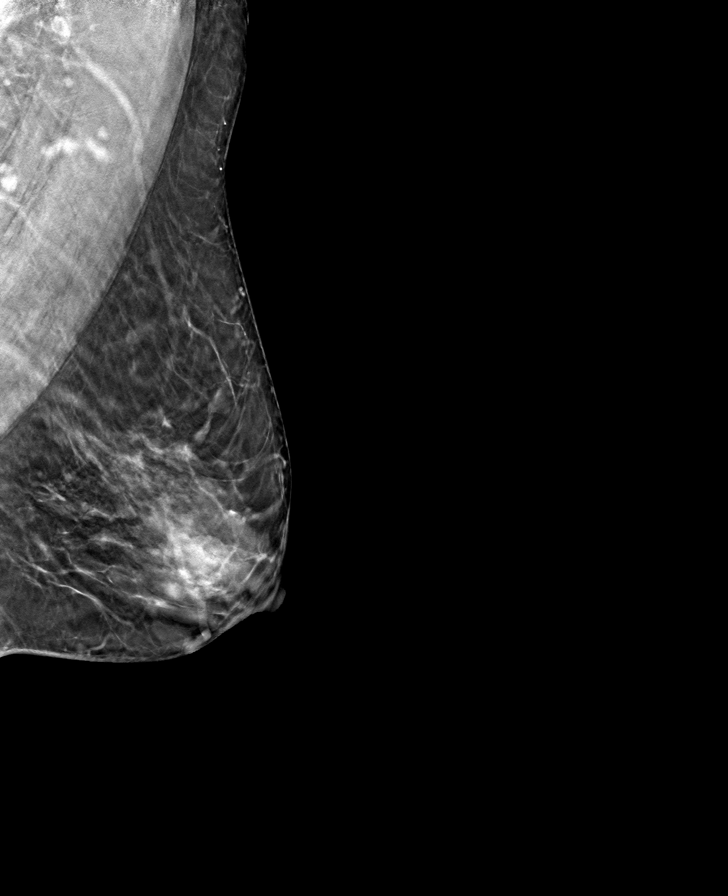

[L CC tomo · tomo slice 23/44.0]
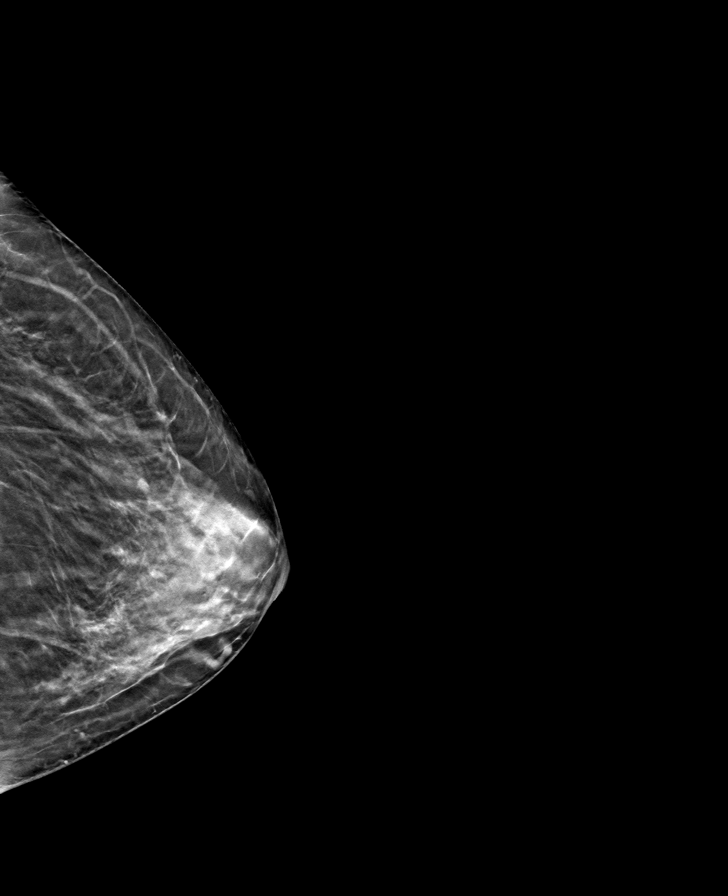

[R CC tomo · tomo slice 23/44.0]
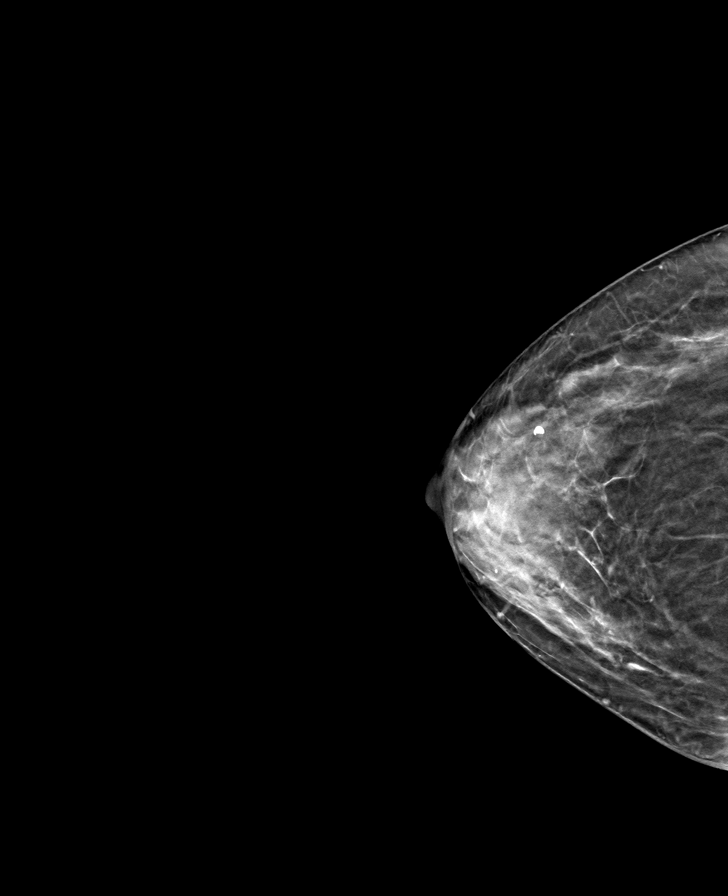

[R MLO tomo · tomo slice 28/55.0]
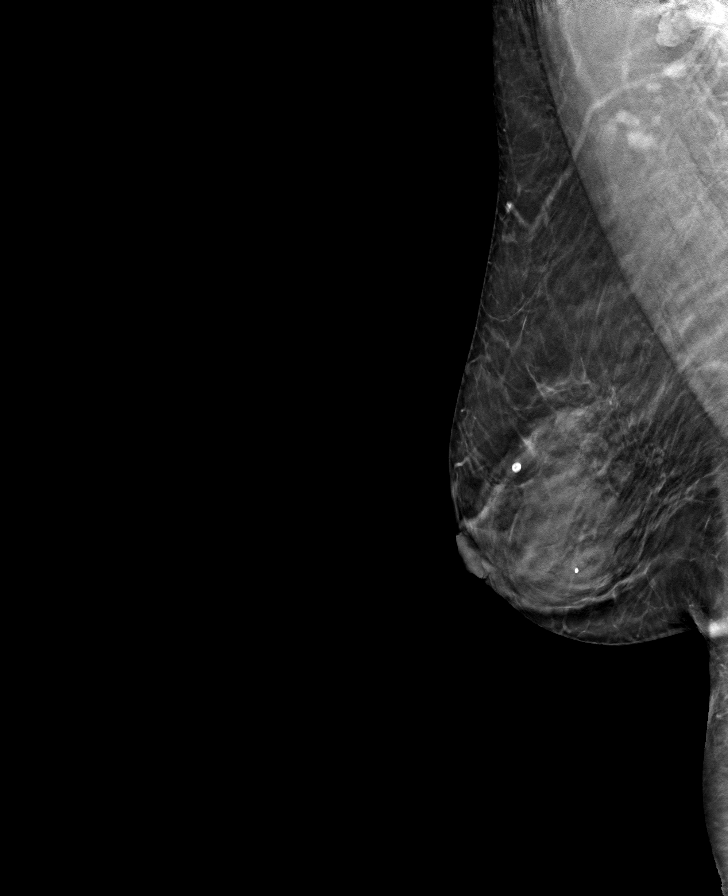

[8 of 24 positions shown; findings below may reference images not displayed]

ACR Breast Density Category c: The breast tissue is heterogeneously
dense, which may obscure small masses.
FINDINGS: There are no findings suspicious for malignancy. Images were
processed with CAD.
IMPRESSION: No mammographic evidence of malignancy. A result letter of this
screening mammogram will be mailed directly to the patient.

RECOMMENDATION:
Screening mammogram in one year. (Code:FT-U-LHB)

BI-RADS CATEGORY  1: Negative.

## 2021-07-14 ENCOUNTER — Ambulatory Visit
Admission: RE | Admit: 2021-07-14 | Discharge: 2021-07-14 | Disposition: A | Discharge status: Home / Self Care | Payer: Medicare Other | Source: Ambulatory Visit | Attending: Family Medicine | Admitting: Family Medicine

## 2021-07-14 DIAGNOSIS — Z1231 Encounter for screening mammogram for malignant neoplasm of breast: Secondary | ICD-10-CM | POA: Diagnosis not present

## 2021-07-14 DIAGNOSIS — Z780 Asymptomatic menopausal state: Secondary | ICD-10-CM | POA: Diagnosis not present

## 2021-09-29 DIAGNOSIS — E113393 Type 2 diabetes mellitus with moderate nonproliferative diabetic retinopathy without macular edema, bilateral: Secondary | ICD-10-CM | POA: Diagnosis not present

## 2021-10-26 ENCOUNTER — Ambulatory Visit (INDEPENDENT_AMBULATORY_CARE_PROVIDER_SITE_OTHER): Payer: Medicare Other

## 2021-10-26 DIAGNOSIS — Z23 Encounter for immunization: Secondary | ICD-10-CM

## 2022-02-06 ENCOUNTER — Ambulatory Visit (INDEPENDENT_AMBULATORY_CARE_PROVIDER_SITE_OTHER): Payer: Medicare Other

## 2022-02-06 VITALS — Ht 64.0 in | Wt 159.0 lb

## 2022-02-06 DIAGNOSIS — Z0000 Encounter for general adult medical examination without abnormal findings: Secondary | ICD-10-CM | POA: Diagnosis not present

## 2022-02-06 NOTE — Patient Instructions (Addendum)
Alexandria Petersen , Thank you for taking time to come for your Medicare Wellness Visit. I appreciate your ongoing commitment to your health goals. Please review the following plan we discussed and let me know if I can assist you in the future.   These are the goals we discussed:  Goals Addressed             This Visit's Progress    Maintain healthy lifestyle       Stay active Healthy diet Stay hydrated         This is a list of the screening recommended for you and due dates:  Health Maintenance  Topic Date Due   DTaP/Tdap/Td vaccine (3 - Td or Tdap) 04/04/2020   COVID-19 Vaccine (4 - 2023-24 season) 02/22/2022*   Zoster (Shingles) Vaccine (1 of 2) 05/08/2022*   Mammogram  07/15/2022   Medicare Annual Wellness Visit  02/07/2023   DEXA scan (bone density measurement)  07/15/2026   Pneumonia Vaccine  Completed   Flu Shot  Completed   HPV Vaccine  Aged Out  *Topic was postponed. The date shown is not the original due date.    Conditions/risks identified: none new.  Next appointment: Follow up in one year for your annual wellness visit    Preventive Care 65 Years and Older, Female Preventive care refers to lifestyle choices and visits with your health care provider that can promote health and wellness. What does preventive care include? A yearly physical exam. This is also called an annual well check. Dental exams once or twice a year. Routine eye exams. Ask your health care provider how often you should have your eyes checked. Personal lifestyle choices, including: Daily care of your teeth and gums. Regular physical activity. Eating a healthy diet. Avoiding tobacco and drug use. Limiting alcohol use. Practicing safe sex. Taking low-dose aspirin every day. Taking vitamin and mineral supplements as recommended by your health care provider. What happens during an annual well check? The services and screenings done by your health care provider during your annual well check  will depend on your age, overall health, lifestyle risk factors, and family history of disease. Counseling  Your health care provider may ask you questions about your: Alcohol use. Tobacco use. Drug use. Emotional well-being. Home and relationship well-being. Sexual activity. Eating habits. History of falls. Memory and ability to understand (cognition). Work and work Statistician. Reproductive health. Screening  You may have the following tests or measurements: Height, weight, and BMI. Blood pressure. Lipid and cholesterol levels. These may be checked every 5 years, or more frequently if you are over 32 years old. Skin check. Lung cancer screening. You may have this screening every year starting at age 43 if you have a 30-pack-year history of smoking and currently smoke or have quit within the past 15 years. Fecal occult blood test (FOBT) of the stool. You may have this test every year starting at age 74. Flexible sigmoidoscopy or colonoscopy. You may have a sigmoidoscopy every 5 years or a colonoscopy every 10 years starting at age 19. Hepatitis C blood test. Hepatitis B blood test. Sexually transmitted disease (STD) testing. Diabetes screening. This is done by checking your blood sugar (glucose) after you have not eaten for a while (fasting). You may have this done every 1-3 years. Bone density scan. This is done to screen for osteoporosis. You may have this done starting at age 2. Mammogram. This may be done every 1-2 years. Talk to your health care provider  about how often you should have regular mammograms. Talk with your health care provider about your test results, treatment options, and if necessary, the need for more tests. Vaccines  Your health care provider may recommend certain vaccines, such as: Influenza vaccine. This is recommended every year. Tetanus, diphtheria, and acellular pertussis (Tdap, Td) vaccine. You may need a Td booster every 10 years. Zoster vaccine. You  may need this after age 83. Pneumococcal 13-valent conjugate (PCV13) vaccine. One dose is recommended after age 11. Pneumococcal polysaccharide (PPSV23) vaccine. One dose is recommended after age 53. Talk to your health care provider about which screenings and vaccines you need and how often you need them. This information is not intended to replace advice given to you by your health care provider. Make sure you discuss any questions you have with your health care provider. Document Released: 01/22/2015 Document Revised: 09/15/2015 Document Reviewed: 10/27/2014 Elsevier Interactive Patient Education  2017 [employer] Prevention in the Home Falls can cause injuries. They can happen to people of all ages. There are many things you can do to make your home safe and to help prevent falls. What can I do on the outside of my home? Regularly fix the edges of walkways and driveways and fix any cracks. Remove anything that might make you trip as you walk through a door, such as a raised step or threshold. Trim any bushes or trees on the path to your home. Use bright outdoor lighting. Clear any walking paths of anything that might make someone trip, such as rocks or tools. Regularly check to see if handrails are loose or broken. Make sure that both sides of any steps have handrails. Any raised decks and porches should have guardrails on the edges. Have any leaves, snow, or ice cleared regularly. Use sand or salt on walking paths during winter. Clean up any spills in your garage right away. This includes oil or grease spills. What can I do in the bathroom? Use night lights. Install grab bars by the toilet and in the tub and shower. Do not use towel bars as grab bars. Use non-skid mats or decals in the tub or shower. If you need to sit down in the shower, use a plastic, non-slip stool. Keep the floor dry. Clean up any water that spills on the floor as soon as it happens. Remove soap buildup  in the tub or shower regularly. Attach bath mats securely with double-sided non-slip rug tape. Do not have throw rugs and other things on the floor that can make you trip. What can I do in the bedroom? Use night lights. Make sure that you have a light by your bed that is easy to reach. Do not use any sheets or blankets that are too big for your bed. They should not hang down onto the floor. Have a firm chair that has side arms. You can use this for support while you get dressed. Do not have throw rugs and other things on the floor that can make you trip. What can I do in the kitchen? Clean up any spills right away. Avoid walking on wet floors. Keep items that you use a lot in easy-to-reach places. If you need to reach something above you, use a strong step stool that has a grab bar. Keep electrical cords out of the way. Do not use floor polish or wax that makes floors slippery. If you must use wax, use non-skid floor wax. Do not have throw rugs and  other things on the floor that can make you trip. What can I do with my stairs? Do not leave any items on the stairs. Make sure that there are handrails on both sides of the stairs and use them. Fix handrails that are broken or loose. Make sure that handrails are as long as the stairways. Check any carpeting to make sure that it is firmly attached to the stairs. Fix any carpet that is loose or worn. Avoid having throw rugs at the top or bottom of the stairs. If you do have throw rugs, attach them to the floor with carpet tape. Make sure that you have a light switch at the top of the stairs and the bottom of the stairs. If you do not have them, ask someone to add them for you. What else can I do to help prevent falls? Wear shoes that: Do not have high heels. Have rubber bottoms. Are comfortable and fit you well. Are closed at the toe. Do not wear sandals. If you use a stepladder: Make sure that it is fully opened. Do not climb a closed  stepladder. Make sure that both sides of the stepladder are locked into place. Ask someone to hold it for you, if possible. Clearly mark and make sure that you can see: Any grab bars or handrails. First and last steps. Where the edge of each step is. Use tools that help you move around (mobility aids) if they are needed. These include: Canes. Walkers. Scooters. Crutches. Turn on the lights when you go into a dark area. Replace any light bulbs as soon as they burn out. Set up your furniture so you have a clear path. Avoid moving your furniture around. If any of your floors are uneven, fix them. If there are any pets around you, be aware of where they are. Review your medicines with your doctor. Some medicines can make you feel dizzy. This can increase your chance of falling. Ask your doctor what other things that you can do to help prevent falls. This information is not intended to replace advice given to you by your health care provider. Make sure you discuss any questions you have with your health care provider. Document Released: 10/22/2008 Document Revised: 06/03/2015 Document Reviewed: 01/30/2014 Elsevier Interactive Patient Education  2017 [employer].

## 2022-02-06 NOTE — Progress Notes (Signed)
Subjective:   Alexandria Petersen is a 82 y.o. female who presents for Medicare Annual (Subsequent) preventive examination.  Review of Systems    No ROS.  Medicare Wellness Virtual Visit.  Visual/audio telehealth visit, UTA vital signs.   See social history for additional risk factors.   Cardiac Risk Factors include: advanced age (>64mn, >>73women);hypertension     Objective:    Today's Vitals   02/06/22 1418  Weight: 159 lb (72.1 kg)  Height: '5\' 4"'$  (1.626 m)   Body mass index is 27.29 kg/m.     02/06/2022    2:29 PM 02/04/2021    1:55 PM 02/04/2020    2:05 PM 09/03/2019    6:39 AM 07/02/2019    7:12 AM 02/24/2019    6:35 AM 02/03/2019    9:07 AM  Advanced Directives  Does Patient Have a Medical Advance Directive? No No No Yes Yes No No  Type of ATheatre stage managerof ADaytonLiving will HFairleeLiving will    Does patient want to make changes to medical advance directive?    No - Patient declined No - Patient declined    Copy of H[place]in Chart?    No - copy requested No - copy requested    Would patient like information on creating a medical advance directive? No - Patient declined Yes (MAU/Ambulatory/Procedural Areas - Information given) No - Patient declined    Yes (MAU/Ambulatory/Procedural Areas - Information given)    Current Medications (verified) Outpatient Encounter Medications as of 02/06/2022  Medication Sig   acetaminophen (TYLENOL) 650 MG CR tablet Take 650 mg by mouth every 8 (eight) hours as needed for pain.   hydrochlorothiazide (MICROZIDE) 12.5 MG capsule TAKE 1 CAPSULE BY MOUTH EVERY DAY   KLOR-CON M20 20 MEQ tablet    Lactobacillus Rhamnosus, GG, (CULTURELLE) CAPS    levothyroxine (SYNTHROID) 75 MCG tablet TAKE 1 TABLET BY MOUTH EVERY DAY EXCEPT ON SUNDAY ONLY TAKE 1&1/2 TABS   losartan (COZAAR) 100 MG tablet TAKE 1 TABLET BY MOUTH EVERY DAY   Multiple Vitamins-Minerals (CENTRUM SILVER  ADULT 50+ PO) Take 1 tablet by mouth daily.   pravastatin (PRAVACHOL) 40 MG tablet TAKE 1 TABLET BY MOUTH EVERY DAY   No facility-administered encounter medications on file as of 02/06/2022.    Allergies (verified) Penicillins   History: Past Medical History:  Diagnosis Date   Arthritis    knee   HLD (hyperlipidemia)    Hypertension    Hypothyroidism    Osteoporosis    Wears dentures    Full upper and lower.  Only wears upper.   Past Surgical History:  Procedure Laterality Date   BREAST BIOPSY Left    CATARACT EXTRACTION W/PHACO Left 07/02/2019   Procedure: CATARACT EXTRACTION PHACO AND INTRAOCULAR LENS PLACEMENT (IOC) LEFT 4.51 00:56.4;  Surgeon: BLeandrew Koyanagi MD;  [facility]  Service: Ophthalmology;  Laterality: Left;   CATARACT EXTRACTION W/PHACO Right 09/03/2019   Procedure: CATARACT EXTRACTION PHACO AND INTRAOCULAR LENS PLACEMENT (IOC) RIGHT DIABETIC 9.40 01:13.6 12.8 %;  Surgeon: BLeandrew Koyanagi MD;  [facility]  Service: Ophthalmology;  Laterality: Right;   HEMORROIDECTOMY     ?   INTRAOCULAR LENS INSERTION Bilateral    ROTATOR CUFF REPAIR Right    TUBAL LIGATION     Family History  Problem Relation Age of Onset   Hypertension Mother    Diabetes Mother    Hypertension Father  Diabetes Sister    Diabetes Brother    Hypothyroidism Daughter    Heart disease Son    Hypothyroidism Son    Social History   Socioeconomic History   Marital status: Widowed    Spouse name: Not on file   Number of children: 3   Years of education: Not on file   Highest education level: Not on file  Occupational History   Occupation: retired day care worker    Employer: RETIRED  Tobacco Use   Smoking status: Never   Smokeless tobacco: Never  Vaping Use   Vaping Use: Never used  Substance and Sexual Activity   Alcohol use: No   Drug use: No   Sexual activity: Never  Other Topics Concern   Not on file  Social History  Narrative   Regular exercise--minimal   She is working on setting up end of life planning.   No living will. Full code (reviewed 2013)               Social Determinants of Health   Financial Resource Strain: Low Risk  (02/06/2022)   Overall Financial Resource Strain (CARDIA)    Difficulty of Paying Living Expenses: Not hard at all  Food Insecurity: No Food Insecurity (02/06/2022)   Hunger Vital Sign    Worried About Running Out of Food in the Last Year: Never true    Ran Out of Food in the Last Year: Never true  Transportation Needs: No Transportation Needs (02/06/2022)   PRAPARE - Hydrologist (Medical): No    Lack of Transportation (Non-Medical): No  Physical Activity: Insufficiently Active (02/06/2022)   Exercise Vital Sign    Days of Exercise per Week: 7 days    Minutes of Exercise per Session: 20 min  Stress: No Stress Concern Present (02/06/2022)   [place]    Feeling of Stress : Not at all  Social Connections: Moderately Integrated (02/06/2022)   Social Connection and Isolation Panel [NHANES]    Frequency of Communication with Friends and Family: More than three times a week    Frequency of Social Gatherings with Friends and Family: More than three times a week    Attends Religious Services: More than 4 times per year    Active Member of [employer] or Organizations: Yes    Attends Archivist Meetings: More than 4 times per year    Marital Status: Widowed    Tobacco Counseling Counseling given: Not Answered   Clinical Intake:  Pre-visit preparation completed: Yes        Diabetes: No  How often do you need to have someone help you when you read instructions, pamphlets, or other written materials from your doctor or pharmacy?: 1 - Never    Interpreter Needed?: No      Activities of Daily Living    02/06/2022    2:25 PM  In your present state of health, do  you have any difficulty performing the following activities:  Hearing? 0  Vision? 0  Difficulty concentrating or making decisions? 0  Walking or climbing stairs? 0  Dressing or bathing? 0  Doing errands, shopping? 0  Preparing Food and eating ? N  Using the Toilet? N  In the past six months, have you accidently leaked urine? N  Do you have problems with loss of bowel control? N  Managing your Medications? N  Managing your Finances? N  Housekeeping or managing your  Housekeeping? N    Patient Care Team: Jinny Sanders, MD as PCP - General  Indicate any recent Medical Services you may have received from other than [facility] providers in the past year (date may be approximate).     Assessment:   This is a routine wellness examination for Caryssa.  I connected with  Quincee Dollene Primrose on 02/06/22 by a audio enabled telemedicine application and verified that I am speaking with the correct person using two identifiers.  Patient Location: Home  Provider Location: Office/Clinic  I discussed the limitations of evaluation and management by telemedicine. The patient expressed understanding and agreed to proceed.   Hearing/Vision screen Hearing Screening - Comments:: Patient is able to hear conversational tones without difficulty.  No issues reported.   Vision Screening - Comments:: Last exam 2023, Dr. Gloriann Loan, wears glasses Cataracts extracted, bilateral  Dietary issues and exercise activities discussed: Current Exercise Habits: Home exercise routine, Type of exercise: stretching;walking, Time (Minutes): 20, Frequency (Times/Week): 7, Weekly Exercise (Minutes/Week): 140, Intensity: Mild   Goals Addressed             This Visit's Progress    Maintain healthy lifestyle       Stay active Healthy diet Stay hydrated       Depression Screen    02/06/2022    2:23 PM 02/04/2021    1:59 PM 02/04/2020    2:07 PM 02/03/2019    9:09 AM 01/28/2018   12:36 PM 12/25/2016    3:06 PM  12/21/2015    1:46 PM  PHQ 2/9 Scores  PHQ - 2 Score 0 0 0 0 0 0 0  PHQ- 9 Score   0 0 0 1     Fall Risk    02/06/2022    2:24 PM 02/04/2021    1:57 PM 02/04/2020    2:06 PM 02/03/2019    9:09 AM 01/28/2018   12:36 PM  Fall Risk   Falls in the past year? 0 0 0 0 0  Number falls in past yr: 0 0 0 0   Injury with Fall? 0 0 0 0   Risk for fall due to : Impaired balance/gait No Fall Risks Medication side effect Medication side effect   Risk for fall due to: Comment Cane in use when ambulating.      Follow up Falls evaluation completed Falls prevention discussed Falls evaluation completed;Falls prevention discussed Falls evaluation completed;Falls prevention discussed     FALL RISK PREVENTION PERTAINING TO THE HOME: Home free of loose throw rugs in walkways, pet beds, electrical cords, etc? Yes  Adequate lighting in your home to reduce risk of falls? Yes   ASSISTIVE DEVICES UTILIZED TO PREVENT FALLS: Life alert? No  Use of a cane, Ziff or w/c? Yes  Grab bars in the bathroom? No  Shower chair or bench in shower? No  Comfort chair height toilet? Yes  TIMED UP AND GO: Was the test performed? No .   Cognitive Function:    02/04/2020    2:09 PM 02/03/2019    9:15 AM 01/28/2018   12:37 PM 12/25/2016    3:15 PM  MMSE - Mini Mental State Exam  Orientation to time '5 5 5 5  '$ Orientation to Place '5 5 5 5  '$ Registration '3 3 3 3  '$ Attention/ Calculation 5 5 0 0  Recall '3 2 1 1  '$ Recall-comments   unable to recall 2 of 3 words unable to recall 2 of 3 words  Language-  name 2 objects   0 0  Language- repeat '1 1 1 1  '$ Language- follow 3 step command   3 3  Language- read & follow direction   0 0  Write a sentence   0 0  Copy design   0 0  Total score   18 18        02/06/2022    2:33 PM  6CIT Screen  What Year? 0 points  What month? 0 points  What time? 0 points  Count back from 20 0 points  Months in reverse 0 points  Repeat phrase 0 points  Total Score 0 points     Immunizations Immunization History  Administered Date(s) Administered   Fluad Quad(high Dose 65+) 09/17/2018, 10/07/2019, 10/26/2021   Influenza Whole 10/16/2008, 10/06/2009   Influenza, High Dose Seasonal PF 10/28/2015, 10/26/2016, 10/13/2020   Influenza,inj,Quad PF,6+ Mos 11/05/2012, 10/30/2013, 10/30/2014, 10/25/2017   PFIZER(Purple Top)SARS-COV-2 Vaccination 02/15/2019, 03/12/2019, 11/21/2019   PPD Test 04/21/2014   Pneumococcal Conjugate-13 12/10/2014   Pneumococcal Polysaccharide-23 10/26/2009   Td 08/29/2005   Tdap 04/05/2010   Zoster, Live 11/09/2009    TDAP status: Due, Education has been provided regarding the importance of this vaccine. Advised may receive this vaccine at local pharmacy or Health Dept. Aware to provide a copy of the vaccination record if obtained from local pharmacy or Health Dept. Verbalized acceptance and understanding.  Shingrix Completed?: No.    Education has been provided regarding the importance of this vaccine. Patient has been advised to call insurance company to determine out of pocket expense if they have not yet received this vaccine. Advised may also receive vaccine at local pharmacy or Health Dept. Verbalized acceptance and understanding.  Screening Tests Health Maintenance  Topic Date Due   DTaP/Tdap/Td (3 - Td or Tdap) 04/04/2020   COVID-19 Vaccine (4 - 2023-24 season) 02/22/2022 (Originally 09/09/2021)   Zoster Vaccines- Shingrix (1 of 2) 05/08/2022 (Originally [birth year]1988)   MAMMOGRAM  07/15/2022   Medicare Annual Wellness (AWV)  02/07/2023   DEXA SCAN  07/15/2026   Pneumonia Vaccine 54+ Years old  Completed   INFLUENZA VACCINE  Completed   HPV VACCINES  Aged Out    Health Maintenance Health Maintenance Due  Topic Date Due   DTaP/Tdap/Td (3 - Td or Tdap) 04/04/2020   Lung Cancer Screening: (Low Dose CT Chest recommended if Age 58-80 years, 30 pack-year currently smoking OR have quit w/in 15years.) does not qualify.   Hepatitis  C Screening: does not qualify.  Vision Screening: Recommended annual ophthalmology exams for early detection of glaucoma and other disorders of the eye.  Dental Screening: Recommended annual dental exams for proper oral hygiene. Dentures.   Community Resource Referral / Chronic Care Management: CRR required this visit?  No   CCM required this visit?  No      Plan:     I have personally reviewed and noted the following in the patient's chart:   Medical and social history Use of alcohol, tobacco or illicit drugs  Current medications and supplements including opioid prescriptions. Patient is not currently taking opioid prescriptions. Functional ability and status Nutritional status Physical activity Advanced directives List of other physicians Hospitalizations, surgeries, and ER visits in previous 12 months Vitals Screenings to include cognitive, depression, and falls Referrals and appointments  In addition, I have reviewed and discussed with patient certain preventive protocols, quality metrics, and best practice recommendations. A written personalized care plan for preventive services as well as general preventive health  recommendations were provided to patient.     Leta Jungling, LPN   [id number]

## 2022-03-30 DIAGNOSIS — E113393 Type 2 diabetes mellitus with moderate nonproliferative diabetic retinopathy without macular edema, bilateral: Secondary | ICD-10-CM | POA: Diagnosis not present

## 2022-04-06 ENCOUNTER — Telehealth: Payer: Self-pay | Admitting: *Deleted

## 2022-04-06 DIAGNOSIS — E7800 Pure hypercholesterolemia, unspecified: Secondary | ICD-10-CM

## 2022-04-06 DIAGNOSIS — E890 Postprocedural hypothyroidism: Secondary | ICD-10-CM

## 2022-04-06 DIAGNOSIS — R7303 Prediabetes: Secondary | ICD-10-CM

## 2022-04-06 NOTE — Telephone Encounter (Signed)
-----   Message from [name] sent at 04/06/2022 11:49 AM EDT ----- Regarding: Lab orders for Wednesday, 4.10.24 Patient is scheduled for CPX labs, please order future labs, Thanks , Karna Christmas

## 2022-04-19 ENCOUNTER — Other Ambulatory Visit (INDEPENDENT_AMBULATORY_CARE_PROVIDER_SITE_OTHER): Payer: Medicare Other

## 2022-04-19 DIAGNOSIS — R7303 Prediabetes: Secondary | ICD-10-CM

## 2022-04-19 DIAGNOSIS — E890 Postprocedural hypothyroidism: Secondary | ICD-10-CM | POA: Diagnosis not present

## 2022-04-19 DIAGNOSIS — E7800 Pure hypercholesterolemia, unspecified: Secondary | ICD-10-CM

## 2022-04-19 LAB — T3, FREE: T3, Free: 2.9 pg/mL (ref 2.3–4.2)

## 2022-04-19 LAB — COMPREHENSIVE METABOLIC PANEL
ALT: 15 U/L (ref 0–35)
AST: 20 U/L (ref 0–37)
Albumin: 4.4 g/dL (ref 3.5–5.2)
Alkaline Phosphatase: 48 U/L (ref 39–117)
BUN: 17 mg/dL (ref 6–23)
CO2: 28 mEq/L (ref 19–32)
Calcium: 10.1 mg/dL (ref 8.4–10.5)
Chloride: 102 mEq/L (ref 96–112)
Creatinine, Ser: 0.99 mg/dL (ref 0.40–1.20)
GFR: 52.77 mL/min — ABNORMAL LOW (ref >60.00)
Glucose, Bld: 107 mg/dL — ABNORMAL HIGH (ref 70–99)
Potassium: 3.7 mEq/L (ref 3.5–5.1)
Sodium: 139 mEq/L (ref 135–145)
Total Bilirubin: 1.2 mg/dL (ref 0.2–1.2)
Total Protein: 7.1 g/dL (ref 6.0–8.3)

## 2022-04-19 LAB — LIPID PANEL
Cholesterol: 226 mg/dL — ABNORMAL HIGH (ref 0–200)
HDL: 98.5 mg/dL (ref >39.00)
LDL Cholesterol: 105 mg/dL — ABNORMAL HIGH (ref 0–99)
NonHDL: 127.78
Total CHOL/HDL Ratio: 2
Triglycerides: 116 mg/dL (ref 0.0–149.0)
VLDL: 23.2 mg/dL (ref 0.0–40.0)

## 2022-04-19 LAB — T4, FREE: Free T4: 1.12 ng/dL (ref 0.60–1.60)

## 2022-04-19 LAB — HEMOGLOBIN A1C: Hgb A1c MFr Bld: 5.9 % (ref 4.6–6.5)

## 2022-04-19 LAB — TSH: TSH: 2.22 u[IU]/mL (ref 0.35–5.50)

## 2022-04-20 NOTE — Progress Notes (Signed)
No critical labs need to be addressed urgently. We will discuss labs in detail at upcoming office visit.   

## 2022-04-25 ENCOUNTER — Other Ambulatory Visit: Payer: Self-pay | Admitting: Endocrinology

## 2022-04-25 DIAGNOSIS — E890 Postprocedural hypothyroidism: Secondary | ICD-10-CM

## 2022-04-26 ENCOUNTER — Ambulatory Visit (INDEPENDENT_AMBULATORY_CARE_PROVIDER_SITE_OTHER): Payer: Medicare Other | Admitting: Family Medicine

## 2022-04-26 ENCOUNTER — Encounter: Payer: Self-pay | Admitting: Family Medicine

## 2022-04-26 VITALS — BP 156/60 | HR 105 | Temp 98.2°F | Ht 64.0 in | Wt 156.0 lb

## 2022-04-26 DIAGNOSIS — R7303 Prediabetes: Secondary | ICD-10-CM | POA: Diagnosis not present

## 2022-04-26 DIAGNOSIS — I10 Essential (primary) hypertension: Secondary | ICD-10-CM | POA: Diagnosis not present

## 2022-04-26 DIAGNOSIS — M25562 Pain in left knee: Secondary | ICD-10-CM

## 2022-04-26 DIAGNOSIS — E7800 Pure hypercholesterolemia, unspecified: Secondary | ICD-10-CM

## 2022-04-26 DIAGNOSIS — Z0000 Encounter for general adult medical examination without abnormal findings: Secondary | ICD-10-CM | POA: Diagnosis not present

## 2022-04-26 DIAGNOSIS — E890 Postprocedural hypothyroidism: Secondary | ICD-10-CM

## 2022-04-26 NOTE — Assessment & Plan Note (Signed)
Stable, chronic.  Continue current medication.   Pravastatin 40 mg p.o. daily 

## 2022-04-26 NOTE — Assessment & Plan Note (Signed)
Chronic, stable  Measurement in office always is elevated.  States when she checks it herself at home it is in the normal range.  HCTZ 12.5 mg daily Losartan 100 mg p.o. daily

## 2022-04-26 NOTE — Assessment & Plan Note (Signed)
Chronic, stable some stiffness and can use Voltaren gel 4 times daily as needed for knee arthritis as well as on trigger finger and right hand.

## 2022-04-26 NOTE — Progress Notes (Signed)
Patient ID: Alexandria Petersen, female    DOB: [birth year], 82 y.o.   MRN: [id number]  This visit was conducted in person.  BP (!) 156/60 (BP Location: Left Arm, Patient Position: Sitting, Cuff Size: Normal)   Pulse (!) 105   Temp 98.2 F (36.8 C) (Temporal)   Ht  (1.626 m)   Wt 156 lb (70.8 kg)   SpO2 99%   BMI 26.78 kg/m    CC:  Chief Complaint  Patient presents with   Medicare Wellness    Part 2    Subjective:   HPI: Alexandria Petersen is a 82 y.o. female presenting on 04/26/2022 for Medicare Wellness (Part 2)  The patient presents for  complete physical and review of chronic health problems. He/She also has the following acute concerns today:  The patient saw a LPN or RN for medicare wellness visit.  Prevention and wellness was reviewed in detail. Note reviewed and important notes copied below.  04/26/22  Hypertension:  Blood pressure not at goal in office today but she does have a history of whitecoat hypertension.  At home her measurements run with insurance visit 136/72 On losartan hydrochlorothiazide BP Readings from Last 3 Encounters:  04/26/22 (!) 156/60  04/28/21 (!) 182/78  04/21/21 136/72  Using medication without problems or lightheadedness: none Chest pain with exertion: none Edema: none Short of breath: none Average home BPs:  130-136/70-80 Other issues:  Elevated Cholesterol: LDL tolerable control on pravastatin 40 mg daily Lab Results  Component Value Date   CHOL 226 (H) 04/19/2022   HDL 98.50 04/19/2022   LDLCALC 105 (H) 04/19/2022   LDLDIRECT 90.7 10/29/2012   TRIG 116.0 04/19/2022   CHOLHDL 2 04/19/2022  Using medications without problems: none Muscle aches:  Diet compliance: heart healthy diet Exercise: walking Other complaints: Wt Readings from Last 3 Encounters:  04/26/22 156 lb (70.8 kg)  02/06/22 159 lb (72.1 kg)  04/28/21 159 lb 3.2 oz (72.2 kg)     Prediabetes   Lab Results  Component Value Date    HGBA1C 5.9 04/19/2022   Postablative hypothyroid Stable..Followed by Dr. Lucianne Muss. Lab Results  Component Value Date   TSH 2.22 04/19/2022         Relevant past medical, surgical, family and social history reviewed and updated as indicated. Interim medical history since our last visit reviewed. Allergies and medications reviewed and updated. Outpatient Medications Prior to Visit  Medication Sig Dispense Refill   acetaminophen (TYLENOL) 650 MG CR tablet Take 650 mg by mouth every 8 (eight) hours as needed for pain.     hydrochlorothiazide (MICROZIDE) 12.5 MG capsule TAKE 1 CAPSULE BY MOUTH EVERY DAY 90 capsule 3   KLOR-CON M20 20 MEQ tablet      Lactobacillus Rhamnosus, GG, (CULTURELLE) CAPS      levothyroxine (SYNTHROID) 75 MCG tablet TAKE 1 TABLET BY MOUTH EVERY DAY EXCEPT ON SUNDAY ONLY TAKE 1&1/2 TABS 96 tablet 1   losartan (COZAAR) 100 MG tablet TAKE 1 TABLET BY MOUTH EVERY DAY 90 tablet 3   Multiple Vitamins-Minerals (CENTRUM SILVER ADULT 50+ PO) Take 1 tablet by mouth daily.     pravastatin (PRAVACHOL) 40 MG tablet TAKE 1 TABLET BY MOUTH EVERY DAY 90 tablet 3   No facility-administered medications prior to visit.     Per HPI unless specifically indicated in ROS section below Review of Systems  Constitutional:  Negative for fatigue and fever.  HENT:  Negative for congestion.   Eyes:  Negative  for pain.  Respiratory:  Negative for cough and shortness of breath.   Cardiovascular:  Negative for chest pain, palpitations and leg swelling.  Gastrointestinal:  Negative for abdominal pain.  Genitourinary:  Negative for dysuria and vaginal bleeding.  Musculoskeletal:  Negative for back pain.  Neurological:  Negative for syncope, light-headedness and headaches.  Psychiatric/Behavioral:  Negative for dysphoric mood.    Objective:  BP (!) 156/60 (BP Location: Left Arm, Patient Position: Sitting, Cuff Size: Normal)   Pulse (!) 105   Temp 98.2 F (36.8 C) (Temporal)   Ht 5\' 4"   (1.626 m)   Wt 156 lb (70.8 kg)   SpO2 99%   BMI 26.78 kg/m   Wt Readings from Last 3 Encounters:  04/26/22 156 lb (70.8 kg)  02/06/22 159 lb (72.1 kg)  04/28/21 159 lb 3.2 oz (72.2 kg)      Physical Exam Vitals and nursing note reviewed.  Constitutional:      General: She is not in acute distress.    Appearance: Normal appearance. She is well-developed. She is not ill-appearing or toxic-appearing.  HENT:     Head: Normocephalic.     Right Ear: Hearing, tympanic membrane, ear canal and external ear normal.     Left Ear: Hearing, tympanic membrane, ear canal and external ear normal.     Nose: Nose normal.  Eyes:     General: Lids are normal. Lids are everted, no foreign bodies appreciated.     Conjunctiva/sclera: Conjunctivae normal.     Pupils: Pupils are equal, round, and reactive to light.  Neck:     Thyroid: No thyroid mass or thyromegaly.     Vascular: No carotid bruit.     Trachea: Trachea normal.  Cardiovascular:     Rate and Rhythm: Normal rate and regular rhythm.     Heart sounds: Normal heart sounds, S1 normal and S2 normal. No murmur heard.    No gallop.  Pulmonary:     Effort: Pulmonary effort is normal. No respiratory distress.     Breath sounds: Normal breath sounds. No wheezing, rhonchi or rales.  Abdominal:     General: Bowel sounds are normal. There is no distension or abdominal bruit.     Palpations: Abdomen is soft. There is no fluid wave or mass.     Tenderness: There is no abdominal tenderness. There is no guarding or rebound.     Hernia: No hernia is present.  Musculoskeletal:     Cervical back: Normal range of motion and neck supple.     Comments: Right fourth digit needed triggering, no pain.  Lymphadenopathy:     Cervical: No cervical adenopathy.  Skin:    General: Skin is warm and dry.     Findings: No rash.  Neurological:     Mental Status: She is alert.     Cranial Nerves: No cranial nerve deficit.     Sensory: No sensory deficit.   Psychiatric:        Mood and Affect: Mood is not anxious or depressed.        Speech: Speech normal.        Behavior: Behavior normal. Behavior is cooperative.        Judgment: Judgment normal.       Results for orders placed or performed in visit on 04/19/22  Hemoglobin A1c  Result Value Ref Range   Hgb A1c MFr Bld 5.9 4.6 - 6.5 %  T3, Free  Result Value Ref Range   T3,  Free 2.9 2.3 - 4.2 pg/mL  T4, free  Result Value Ref Range   Free T4 1.12 0.60 - 1.60 ng/dL  Comprehensive metabolic panel  Result Value Ref Range   Sodium 139 135 - 145 mEq/L   Potassium 3.7 3.5 - 5.1 mEq/L   Chloride 102 96 - 112 mEq/L   CO2 28 19 - 32 mEq/L   Glucose, Bld 107 (H) 70 - 99 mg/dL   BUN 17 6 - 23 mg/dL   Creatinine, Ser 8.11 0.40 - 1.20 mg/dL   Total Bilirubin 1.2 0.2 - 1.2 mg/dL   Alkaline Phosphatase 48 39 - 117 U/L   AST 20 0 - 37 U/L   ALT 15 0 - 35 U/L   Total Protein 7.1 6.0 - 8.3 g/dL   Albumin 4.4 3.5 - 5.2 g/dL   GFR 91.47 (L) >82.95 mL/min   Calcium 10.1 8.4 - 10.5 mg/dL  Lipid panel  Result Value Ref Range   Cholesterol 226 (H) 0 - 200 mg/dL   Triglycerides 621.3 0.0 - 149.0 mg/dL   HDL 08.65 >78.46 mg/dL   VLDL 96.2 0.0 - 95.2 mg/dL   LDL Cholesterol 841 (H) 0 - 99 mg/dL   Total CHOL/HDL Ratio 2    NonHDL 127.78   TSH  Result Value Ref Range   TSH 2.22 0.35 - 5.50 uIU/mL    This visit occurred during the SARS-CoV-2 public health emergency.  Safety protocols were in place, including screening questions prior to the visit, additional usage of staff PPE, and extensive cleaning of exam room while observing appropriate contact time as indicated for disinfecting solutions.   COVID 19 screen:  No recent travel or known exposure to COVID19 The patient denies respiratory symptoms of COVID 19 at this time. The importance of social distancing was discussed today.   Assessment and Plan The patient's preventative maintenance and recommended screening tests for an annual  wellness exam were reviewed in full today. Brought up to date unless services declined.  Counselled on the importance of diet, exercise, and its role in overall health and mortality. The patient's FH and SH was reviewed, including their home life, tobacco status, and drug and alcohol status.   Vaccines: uptodate Td, flu,  COVID vaccine x 3 , Rx for shingrix given in past.. reminded, consider bivalent COVID booster Pap/DVE:  No pap indicated Mammo:  nml 2022, has appt for7/06/2021 Bone Density: 02/2017 osteopenia, repeat scheduled for 07/2021 Colon: yearly stool cards.. neg 01/2017, no further indicated Smoking Status: nonsmoker  Routine general medical examination at a health care facility  HYPERTENSION, BENIGN ESSENTIAL Assessment & Plan: Chronic, stable  Measurement in office always is elevated.  States when she checks it herself at home it is in the normal range.  HCTZ 12.5 mg daily Losartan 100 mg p.o. daily   Postablative hypothyroidism Assessment & Plan: Chronic,  stable Followed by Dr. Lucianne Muss Endo.     HYPERCHOLESTEROLEMIA, PURE Assessment & Plan: Stable, chronic.  Continue current medication.  Pravastatin 40 mg p.o. daily   Prediabetes Assessment & Plan: Encouraged exercise, weight loss, healthy eating habits.    Left medial knee pain Assessment & Plan: Chronic, stable some stiffness and can use Voltaren gel 4 times daily as needed for knee arthritis as well as on trigger finger and right hand.      Kerby Nora, MD

## 2022-04-26 NOTE — Assessment & Plan Note (Addendum)
Chronic,  stable Followed by Dr. Lucianne Muss Endo.

## 2022-04-26 NOTE — Patient Instructions (Addendum)
Can try Voltaren gel  four times daily on knees on finger for arthritis or trigger finger.

## 2022-04-26 NOTE — Assessment & Plan Note (Signed)
Encouraged exercise, weight loss, healthy eating habits. ? ?

## 2022-04-28 ENCOUNTER — Other Ambulatory Visit: Payer: Medicare Other

## 2022-05-02 ENCOUNTER — Encounter: Payer: Self-pay | Admitting: Endocrinology

## 2022-05-02 ENCOUNTER — Ambulatory Visit: Payer: Medicare Other | Admitting: Endocrinology

## 2022-05-02 VITALS — BP 134/82 | HR 100 | Ht 64.0 in | Wt 159.0 lb

## 2022-05-02 DIAGNOSIS — E890 Postprocedural hypothyroidism: Secondary | ICD-10-CM | POA: Diagnosis not present

## 2022-05-02 NOTE — Progress Notes (Signed)
Patient ID: Alexandria Petersen, female   DOB: 1940-09-23, 82 y.o.   MRN: [id number]                                                                                                               Reason for Appointment: Hypothyroidism, follow up     History of Present Illness:   Prior history: When she was first seen in 12/16 she had symptoms of weight loss,  palpitations, feeling tired, occasionally feeling warm and some weakness in her legs. Also was having decreased appetite.  She previously had suppressed TSH levels since at least 10/2013 Pretreatment labs showed increased free T4 and free T3 levels in 12/16  She was also found to have a goiter on exam initially.  Was started on Methimazole 5 mg twice a day in 12/2014   Her methimazole was tapered off and stopped in 09/2015 when her free T4 was low normal However without her methimazole she had a recurrence of her hyperthyroidism in 1/18; she was having symptoms of palpitations and fatigue She finally had her I-131 treatment on 05/02/16 with 18.1 mCi   RECENT history:  She had become hypothyroid in 07/2016 when she was having fatigue, increased weight and hoarseness She has been started on levothyroxine, initially 112 g and then this was reduced gradually  She has been on 75 mcg since 2019 and taking extra half tablet weekly when TSH was 8.1 in 5/20 This dose is equivalent to 80 mcg daily   Most of the time she feels fairly good and not complaining of fatigue No cold intolerance or skin changes No weight change  She has been consistent with taking her levothyroxine about 1/2 hour before breakfast daily  TSH is consistently normal now  Wt Readings from Last 3 Encounters:  05/02/22 159 lb (72.1 kg)  04/26/22 156 lb (70.8 kg)  02/06/22 159 lb (72.1 kg)   Lab Results  Component Value Date   TSH 2.22 04/19/2022   TSH 1.61 04/21/2021   TSH 1.33 09/09/2020   FREET4 1.12 04/19/2022   FREET4 1.25 04/21/2021   FREET4  0.97 09/09/2020       Allergies as of 05/02/2022       Reactions   Penicillins    REACTION: hallucinations        Medication List        Accurate as of May 02, 2022 10:59 AM. If you have any questions, ask your nurse or doctor.          acetaminophen 650 MG CR tablet Commonly known as: TYLENOL Take 650 mg by mouth every 8 (eight) hours as needed for pain.   CENTRUM SILVER ADULT 50+ PO Take 1 tablet by mouth daily.   Culturelle Caps   hydrochlorothiazide 12.5 MG capsule Commonly known as: MICROZIDE TAKE 1 CAPSULE BY MOUTH EVERY DAY   Klor-Con M20 20 MEQ tablet Generic drug: potassium chloride SA   levothyroxine 75 MCG tablet Commonly known as: SYNTHROID TAKE 1 TABLET BY MOUTH EVERY DAY EXCEPT ON  SUNDAY ONLY TAKE 1&1/2 TABS   losartan 100 MG tablet Commonly known as: COZAAR TAKE 1 TABLET BY MOUTH EVERY DAY   pravastatin 40 MG tablet Commonly known as: PRAVACHOL TAKE 1 TABLET BY MOUTH EVERY DAY            Past Medical History:  Diagnosis Date   Arthritis    knee   HLD (hyperlipidemia)    Hypertension    Hypothyroidism    Osteoporosis    Wears dentures    Full upper and lower.  Only wears upper.    Past Surgical History:  Procedure Laterality Date   BREAST BIOPSY Left    CATARACT EXTRACTION W/PHACO Left 07/02/2019   Procedure: CATARACT EXTRACTION PHACO AND INTRAOCULAR LENS PLACEMENT (IOC) LEFT 4.51 00:56.4;  Surgeon: Lockie Mola, MD;  Location: [facility];  Service: Ophthalmology;  Laterality: Left;   CATARACT EXTRACTION W/PHACO Right 09/03/2019   Procedure: CATARACT EXTRACTION PHACO AND INTRAOCULAR LENS PLACEMENT (IOC) RIGHT DIABETIC 9.40 01:13.6 12.8 %;  Surgeon: Lockie Mola, MD;  Location: [facility];  Service: Ophthalmology;  Laterality: Right;   HEMORROIDECTOMY     ?   INTRAOCULAR LENS INSERTION Bilateral    ROTATOR CUFF REPAIR Right    TUBAL LIGATION      Family History  Problem Relation Age of  Onset   Hypertension Mother    Diabetes Mother    Hypertension Father    Diabetes Sister    Diabetes Brother    Hypothyroidism Daughter    Heart disease Son    Hypothyroidism Son     Social History:  reports that she has never smoked. She has never used smokeless tobacco. She reports that she does not drink alcohol and does not use drugs.  Allergies:  Allergies  Allergen Reactions   Penicillins     REACTION: hallucinations     Review of Systems  Blood pressure is being treated with losartan and HCTZ  Hypertension has been managed by her PCP She is followed by her PCP  She is taking her blood pressure regularly at home and her blood pressure at home is about 130-135 systolic   She usually has significant whitecoat hypertension and blood pressure was significantly high along with pulse rate  BP Readings from Last 3 Encounters:  05/02/22 134/82  04/26/22 (!) 156/60  04/28/21 (!) 182/78    She has impaired fasting glucose followed by her PCP  Lab Results  Component Value Date   HGBA1C 5.9 04/19/2022   HGBA1C 6.0 04/21/2021   HGBA1C 5.9 02/04/2020   Lab Results  Component Value Date   LDLCALC 105 (H) 04/19/2022   CREATININE 0.99 04/19/2022      Examination:   BP 134/82 (BP Location: Left Arm, Patient Position: Sitting, Cuff Size: Small)   Pulse 100   Ht 5\' 4"  (1.626 m)   Wt 159 lb (72.1 kg)   SpO2 97%   BMI 27.29 kg/m   Heart rate repeat was 88, regular Biceps reflexes show normal relaxation   Assessment/Plan:  Post-ablative hypothyroidism since 07/2016  She is taking the equivalent of 80 mcg of levothyroxine since 2020 She is subjectively doing well  Her TSH is consistently normal She can now come back prn and have her labs checked with PCP annually   There are no Patient Instructions on file for this visit.     Reather Littler 05/02/2022, 10:59 AM   Note: This office note was prepared with Dragon voice recognition system technology. Any  transcriptional  errors that result from this process are unintentional.

## 2022-05-28 ENCOUNTER — Other Ambulatory Visit: Payer: Self-pay | Admitting: Endocrinology

## 2022-06-02 ENCOUNTER — Other Ambulatory Visit: Payer: Self-pay | Admitting: Family Medicine

## 2022-06-12 ENCOUNTER — Encounter: Payer: Self-pay | Admitting: Family Medicine

## 2022-06-12 ENCOUNTER — Other Ambulatory Visit: Payer: Self-pay | Admitting: Family Medicine

## 2022-06-12 DIAGNOSIS — Z1231 Encounter for screening mammogram for malignant neoplasm of breast: Secondary | ICD-10-CM

## 2022-06-12 DIAGNOSIS — Z0000 Encounter for general adult medical examination without abnormal findings: Secondary | ICD-10-CM

## 2022-07-17 ENCOUNTER — Ambulatory Visit
Admission: RE | Admit: 2022-07-17 | Discharge: 2022-07-17 | Disposition: A | Discharge status: Home / Self Care | Payer: Medicare Other | Source: Ambulatory Visit | Attending: Family Medicine | Admitting: Family Medicine

## 2022-07-17 DIAGNOSIS — Z1231 Encounter for screening mammogram for malignant neoplasm of breast: Secondary | ICD-10-CM | POA: Diagnosis not present

## 2022-08-28 ENCOUNTER — Other Ambulatory Visit: Payer: Self-pay | Admitting: Endocrinology

## 2022-09-28 DIAGNOSIS — E113393 Type 2 diabetes mellitus with moderate nonproliferative diabetic retinopathy without macular edema, bilateral: Secondary | ICD-10-CM | POA: Diagnosis not present

## 2022-11-21 ENCOUNTER — Other Ambulatory Visit: Payer: Self-pay

## 2022-11-21 DIAGNOSIS — E890 Postprocedural hypothyroidism: Secondary | ICD-10-CM

## 2022-11-24 ENCOUNTER — Other Ambulatory Visit (INDEPENDENT_AMBULATORY_CARE_PROVIDER_SITE_OTHER): Payer: Medicare Other

## 2022-11-24 DIAGNOSIS — E890 Postprocedural hypothyroidism: Secondary | ICD-10-CM

## 2022-11-24 LAB — T4, FREE: Free T4: 1.45 ng/dL (ref 0.60–1.60)

## 2022-11-24 LAB — TSH: TSH: 2.99 u[IU]/mL (ref 0.35–5.50)

## 2022-11-29 ENCOUNTER — Encounter: Payer: Self-pay | Admitting: "Endocrinology

## 2022-11-29 ENCOUNTER — Ambulatory Visit: Payer: Medicare Other | Admitting: "Endocrinology

## 2022-11-29 VITALS — BP 160/64 | HR 110 | Ht 64.0 in | Wt 154.0 lb

## 2022-11-29 DIAGNOSIS — Z8639 Personal history of other endocrine, nutritional and metabolic disease: Secondary | ICD-10-CM | POA: Diagnosis not present

## 2022-11-29 DIAGNOSIS — E890 Postprocedural hypothyroidism: Secondary | ICD-10-CM

## 2022-11-29 NOTE — Progress Notes (Addendum)
Outpatient Endocrinology Note Alexandria Sunriver, MD  11/29/22   Alexandria Petersen [birth year] [id number]  Referring Provider: Excell Seltzer, MD Primary Care Provider: Excell Seltzer, MD Subjective  No chief complaint on file.   Assessment & Plan  Diagnoses and all orders for this visit:  Postablative hypothyroidism -     TSH Rfx on Abnormal to Free T4; Future  History of Graves' disease   Alexandria Petersen is currently taking levothyroxine 75 mcg qd. Patient is currently biochemically euthyroid.  Educated on thyroid axis.  Recommend the following: Take levothyroxine 75 mcg every morning.  Advised to take levothyroxine first thing in the morning on empty stomach and wait at least 30 minutes to 1 hour before eating or drinking anything or taking any other medications. Space out levothyroxine by 4 hours from any acid reflux medication/fibrate/iron/calcium/multivitamin. Advised to take birth control pills and nutritional supplements in the evening. Repeat lab before next visit or sooner if symptoms of hyperthyroidism or hypothyroidism develop.  Notify us immediately in case of pregnancy/breastfeeding or significant weight gain or loss. Counseled on compliance and follow up needs.  I have reviewed current medications, nurse's notes, allergies, vital signs, past medical and surgical history, family medical history, and social history for this encounter. Counseled patient on symptoms, examination findings, lab findings, imaging results, treatment decisions and monitoring and prognosis. The patient understood the recommendations and agrees with the treatment plan. All questions regarding treatment plan were fully answered.   Return in about 6 months (around 05/29/2023) for visit + labs before next visit.   Alexandria [place], MD  11/29/22   I have reviewed current medications, nurse's notes, allergies, vital signs, past medical and surgical history, family medical  history, and social history for this encounter. Counseled patient on symptoms, examination findings, lab findings, imaging results, treatment decisions and monitoring and prognosis. The patient understood the recommendations and agrees with the treatment plan. All questions regarding treatment plan were fully answered.   History of Present Illness Alexandria Petersen is a 82 y.o. year old female who presents to our clinic with postablative hypothyroidism.    When she was first seen in 12/16 she had symptoms of weight loss,  palpitations, feeling tired, occasionally feeling warm and some weakness in her legs. Also was having decreased appetite. She previously had suppressed TSH levels since at least 10/2013 Pretreatment labs showed increased free T4 and free T3 levels in 12/16. She was also found to have a goiter on exam initially.  Was started on Methimazole 5 mg twice a day in 12/2014. Her methimazole was tapered off and stopped in 09/2015 when her free T4 was low normal. However without her methimazole she had a recurrence of her hyperthyroidism in 1/18; she was having symptoms of palpitations and fatigue. She finally had her I-131 treatment on 05/02/16 with 18.1 mCi.  Currently on levothyroxine 75 mcg every day.  Symptoms suggestive of HYPOTHYROIDISM:  fatigue No weight gain No cold intolerance  No constipation  No  Symptoms suggestive of HYPERTHYROIDISM:  weight loss  No heat intolerance No hyperdefecation  No palpitations  No  Compressive symptoms:  dysphagia  No dysphonia  No positional dyspnea (especially with simultaneous arms elevation)  No  Smokes  No On biotin  No Personal history of head/neck surgery/irradiation  Yes  Physical Exam  BP (!) 160/64   Pulse (!) 110   Ht 5\' 4"  (1.626 m)   Wt 154 lb (69.9 kg)   SpO2 96%  BMI 26.43 kg/m  Constitutional: well developed, well nourished Head: normocephalic, atraumatic, no exophthalmos Eyes: sclera anicteric, no  redness Neck: no thyromegaly, no thyroid tenderness; no nodules palpated Lungs: normal respiratory effort Neurology: alert and oriented, no fine hand tremor Skin: dry, no appreciable rashes Musculoskeletal: no appreciable defects Psychiatric: normal mood and affect  Allergies Allergies  Allergen Reactions   Penicillins     REACTION: hallucinations    Current Medications Patient's Medications  New Prescriptions   No medications on file  Previous Medications   ACETAMINOPHEN (TYLENOL) 650 MG CR TABLET    Take 650 mg by mouth every 8 (eight) hours as needed for pain.   HYDROCHLOROTHIAZIDE (MICROZIDE) 12.5 MG CAPSULE    TAKE 1 CAPSULE BY MOUTH EVERY DAY   KLOR-CON M20 20 MEQ TABLET       LACTOBACILLUS RHAMNOSUS, GG, (CULTURELLE) CAPS       LEVOTHYROXINE (SYNTHROID) 75 MCG TABLET    TAKE 1 TABLET BY MOUTH EVERY DAY EXCEPT ON SUNDAY ONLY TAKE 1&1/2 TABS   LOSARTAN (COZAAR) 100 MG TABLET    TAKE 1 TABLET BY MOUTH EVERY DAY   MULTIPLE VITAMINS-MINERALS (CENTRUM SILVER ADULT 50+ PO)    Take 1 tablet by mouth daily.   PRAVASTATIN (PRAVACHOL) 40 MG TABLET    TAKE 1 TABLET BY MOUTH EVERY DAY  Modified Medications   No medications on file  Discontinued Medications   No medications on file    Past Medical History Past Medical History:  Diagnosis Date   Arthritis    knee   HLD (hyperlipidemia)    Hypertension    Hypothyroidism    Osteoporosis    Wears dentures    Full upper and lower.  Only wears upper.    Past Surgical History Past Surgical History:  Procedure Laterality Date   BREAST BIOPSY Left    CATARACT EXTRACTION W/PHACO Left 07/02/2019   Procedure: CATARACT EXTRACTION PHACO AND INTRAOCULAR LENS PLACEMENT (IOC) LEFT 4.51 00:56.4;  Surgeon: Lockie Mola, MD;  Location: [facility];  Service: Ophthalmology;  Laterality: Left;   CATARACT EXTRACTION W/PHACO Right 09/03/2019   Procedure: CATARACT EXTRACTION PHACO AND INTRAOCULAR LENS PLACEMENT (IOC) RIGHT  DIABETIC 9.40 01:13.6 12.8 %;  Surgeon: Lockie Mola, MD;  Location: [facility];  Service: Ophthalmology;  Laterality: Right;   HEMORROIDECTOMY     ?   INTRAOCULAR LENS INSERTION Bilateral    ROTATOR CUFF REPAIR Right    TUBAL LIGATION      Family History family history includes Diabetes in her brother, mother, and sister; Heart disease in her son; Hypertension in her father and mother; Hypothyroidism in her daughter and son.  Social History Social History   Socioeconomic History   Marital status: Widowed    Spouse name: Not on file   Number of children: 3   Years of education: Not on file   Highest education level: Not on file  Occupational History   Occupation: retired day care worker    Employer: RETIRED  Tobacco Use   Smoking status: Never   Smokeless tobacco: Never  Vaping Use   Vaping status: Never Used  Substance and Sexual Activity   Alcohol use: No   Drug use: No   Sexual activity: Never  Other Topics Concern   Not on file  Social History Narrative   Regular exercise--minimal   She is working on setting up end of life planning.   No living will. Full code (reviewed 2013)  Social Determinants of Health   Financial Resource Strain: Low Risk  (02/06/2022)   Overall Financial Resource Strain (CARDIA)    Difficulty of Paying Living Expenses: Not hard at all  Food Insecurity: No Food Insecurity (02/06/2022)   Hunger Vital Sign    Worried About Running Out of Food in the Last Year: Never true    Ran Out of Food in the Last Year: Never true  Transportation Needs: No Transportation Needs (02/06/2022)   PRAPARE - Administrator, Civil Service (Medical): No    Lack of Transportation (Non-Medical): No  Physical Activity: Insufficiently Active (02/06/2022)   Exercise Vital Sign    Days of Exercise per Week: 7 days    Minutes of Exercise per Session: 20 min  Stress: No Stress Concern Present (02/06/2022)   [employer]  of Occupational Health - Occupational Stress Questionnaire    Feeling of Stress : Not at all  Social Connections: Moderately Integrated (02/06/2022)   Social Connection and Isolation Panel [NHANES]    Frequency of Communication with Friends and Family: More than three times a week    Frequency of Social Gatherings with Friends and Family: More than three times a week    Attends Religious Services: More than 4 times per year    Active Member of [employer] or Organizations: Yes    Attends Banker Meetings: More than 4 times per year    Marital Status: Widowed  Intimate Partner Violence: Not At Risk (02/06/2022)   Humiliation, Afraid, Rape, and Kick questionnaire    Fear of Current or Ex-Partner: No    Emotionally Abused: No    Physically Abused: No    Sexually Abused: No    Laboratory Investigations Lab Results  Component Value Date   TSH 2.99 11/24/2022   TSH 2.22 04/19/2022   TSH 1.61 04/21/2021   FREET4 1.45 11/24/2022   FREET4 1.12 04/19/2022   FREET4 1.25 04/21/2021     No results found for: "TSI"   No components found for: "TRAB"   Lab Results  Component Value Date   CHOL 226 (H) 04/19/2022   Lab Results  Component Value Date   HDL 98.50 04/19/2022   Lab Results  Component Value Date   LDLCALC 105 (H) 04/19/2022   Lab Results  Component Value Date   TRIG 116.0 04/19/2022   Lab Results  Component Value Date   CHOLHDL 2 04/19/2022   Lab Results  Component Value Date   CREATININE 0.99 04/19/2022   Lab Results  Component Value Date   GFR 52.77 (L) 04/19/2022      Component Value Date/Time   NA 139 04/19/2022 0901   K 3.7 04/19/2022 0901   CL 102 04/19/2022 0901   CO2 28 04/19/2022 0901   GLUCOSE 107 (H) 04/19/2022 0901   BUN 17 04/19/2022 0901   CREATININE 0.99 04/19/2022 0901   CALCIUM 10.1 04/19/2022 0901   PROT 7.1 04/19/2022 0901   ALBUMIN 4.4 04/19/2022 0901   AST 20 04/19/2022 0901   ALT 15 04/19/2022 0901   ALKPHOS 48 04/19/2022  0901   BILITOT 1.2 04/19/2022 0901   GFRNONAA 48 (L) 02/24/2019 0715   GFRAA 56 (L) 02/24/2019 0715      Latest Ref Rng & Units 04/19/2022    9:01 AM 04/21/2021   12:04 PM 02/04/2020    8:14 AM  BMP  Glucose 70 - 99 mg/dL 299  371  696   BUN 6 - 23 mg/dL 17  15  19   Creatinine 0.40 - 1.20 mg/dL 9.62  9.52  8.41   Sodium 135 - 145 mEq/L 139  139  141   Potassium 3.5 - 5.1 mEq/L 3.7  3.7  3.7   Chloride 96 - 112 mEq/L 102  103  103   CO2 19 - 32 mEq/L 28  27  29    Calcium 8.4 - 10.5 mg/dL 32.4  40.1  02.7        Component Value Date/Time   WBC 11.4 (H) 02/24/2019 0715   RBC 4.77 02/24/2019 0715   HGB 14.4 02/24/2019 0715   HCT 43.1 02/24/2019 0715   PLT 263 02/24/2019 0715   MCV 90.4 02/24/2019 0715   MCH 30.2 02/24/2019 0715   MCHC 33.4 02/24/2019 0715   RDW 14.0 02/24/2019 0715   LYMPHSABS 1.5 02/24/2019 0715   MONOABS 0.9 02/24/2019 0715   EOSABS 0.1 02/24/2019 0715   BASOSABS 0.1 02/24/2019 0715      Parts of this note may have been dictated using voice recognition software. There may be variances in spelling and vocabulary which are unintentional. Not all errors are proofread. Please notify the There[facility] Parkin if any discrepancies are noted or if the meaning of any statement is not clear.

## 2022-12-11 ENCOUNTER — Other Ambulatory Visit: Payer: Self-pay

## 2022-12-11 ENCOUNTER — Telehealth: Payer: Self-pay | Admitting: "Endocrinology

## 2022-12-11 DIAGNOSIS — Z8639 Personal history of other endocrine, nutritional and metabolic disease: Secondary | ICD-10-CM

## 2022-12-11 MED ORDER — LEVOTHYROXINE SODIUM 75 MCG PO TABS: ORAL_TABLET | ORAL | 0 refills | Status: DC

## 2022-12-11 NOTE — Telephone Encounter (Signed)
MEDICATION: Levothyroxine Has been sent to    PHARMACY:  [facility] on [address]

## 2022-12-11 NOTE — Telephone Encounter (Signed)
MEDICATION: Levothyroxine  PHARMACY:  [facility] on [address]  HAS THE PATIENT CONTACTED THEIR PHARMACY?  YES  IS THIS A 90 DAY SUPPLY : YES  IS PATIENT OUT OF MEDICATION: YES  IF NOT; HOW MUCH IS LEFT: 0  LAST APPOINTMENT DATE: @11 /20/2024  NEXT APPOINTMENT DATE:@5 /13/2025  DO WE HAVE YOUR PERMISSION TO LEAVE A DETAILED MESSAGE?:  OTHER COMMENTS:    **Let patient know to contact pharmacy at the end of the day to make sure medication is ready. **  ** Please notify patient to allow 48-72 hours to process**  **Encourage patient to contact the pharmacy for refills or they can request refills through [facility]**

## 2023-02-08 ENCOUNTER — Ambulatory Visit: Payer: Medicare Other

## 2023-02-08 VITALS — Ht 64.0 in | Wt 151.0 lb

## 2023-02-08 DIAGNOSIS — Z0000 Encounter for general adult medical examination without abnormal findings: Secondary | ICD-10-CM | POA: Diagnosis not present

## 2023-02-08 NOTE — Progress Notes (Signed)
Subjective:   Alexandria Petersen is a 83 y.o. female who presents for Medicare Annual (Subsequent) preventive examination.  Visit Complete: Virtual I connected with  Alexandria Petersen on 02/08/23 by a audio enabled telemedicine application and verified that I am speaking with the correct person using two identifiers.  Patient Location: Home  Provider Location: Office/Clinic  I discussed the limitations of evaluation and management by telemedicine. The patient expressed understanding and agreed to proceed.  Vital Signs: Because this visit was a virtual/telehealth visit, some criteria may be missing or patient reported. Any vitals not documented were not able to be obtained and vitals that have been documented are patient reported.  Patient Medicare AWV questionnaire was completed by the patient on (not done); I have confirmed that all information answered by patient is correct and no changes since this date.  Cardiac Risk Factors include: advanced age (>35men, >85 women);dyslipidemia;hypertension    Objective:    Today's Vitals   02/08/23 1038  Weight: 151 lb (68.5 kg)  Height: 5\' 4"  (1.626 m)   Body mass index is 25.92 kg/m.     02/08/2023   10:52 AM 02/06/2022    2:29 PM 02/04/2021    1:55 PM 02/04/2020    2:05 PM 09/03/2019    6:39 AM 07/02/2019    7:12 AM 02/24/2019    6:35 AM  Advanced Directives  Does Patient Have a Medical Advance Directive? No No No No Yes Yes No  Type of Agricultural consultant;Living will Healthcare Power of [place];Living will   Does patient want to make changes to medical advance directive?     No - Patient declined No - Patient declined   Copy of Healthcare Power of Attorney in Chart?     No - copy requested No - copy requested   Would patient like information on creating a medical advance directive?  No - Patient declined Yes (MAU/Ambulatory/Procedural Areas - Information given) No - Patient declined        Current Medications (verified) Outpatient Encounter Medications as of 02/08/2023  Medication Sig   acetaminophen (TYLENOL) 650 MG CR tablet Take 650 mg by mouth every 8 (eight) hours as needed for pain.   hydrochlorothiazide (MICROZIDE) 12.5 MG capsule TAKE 1 CAPSULE BY MOUTH EVERY DAY   KLOR-CON M20 20 MEQ tablet    Lactobacillus Rhamnosus, GG, (CULTURELLE) CAPS    levothyroxine (SYNTHROID) 75 MCG tablet TAKE 1 TABLET BY MOUTH EVERY DAY EXCEPT ON SUNDAY ONLY TAKE 1&1/2 TABS   losartan (COZAAR) 100 MG tablet TAKE 1 TABLET BY MOUTH EVERY DAY   Multiple Vitamins-Minerals (CENTRUM SILVER ADULT 50+ PO) Take 1 tablet by mouth daily.   pravastatin (PRAVACHOL) 40 MG tablet TAKE 1 TABLET BY MOUTH EVERY DAY   No facility-administered encounter medications on file as of 02/08/2023.    Allergies (verified) Penicillins   History: Past Medical History:  Diagnosis Date   Arthritis    knee   HLD (hyperlipidemia)    Hypertension    Hypothyroidism    Osteoporosis    Wears dentures    Full upper and lower.  Only wears upper.   Past Surgical History:  Procedure Laterality Date   BREAST BIOPSY Left    CATARACT EXTRACTION W/PHACO Left 07/02/2019   Procedure: CATARACT EXTRACTION PHACO AND INTRAOCULAR LENS PLACEMENT (IOC) LEFT 4.51 00:56.4;  Surgeon: Lockie Mola, MD;  Location: [facility];  Service: Ophthalmology;  Laterality: Left;   CATARACT EXTRACTION W/PHACO  Right 09/03/2019   Procedure: CATARACT EXTRACTION PHACO AND INTRAOCULAR LENS PLACEMENT (IOC) RIGHT DIABETIC 9.40 01:13.6 12.8 %;  Surgeon: Lockie Mola, MD;  Location: [facility];  Service: Ophthalmology;  Laterality: Right;   HEMORROIDECTOMY     ?   INTRAOCULAR LENS INSERTION Bilateral    ROTATOR CUFF REPAIR Right    TUBAL LIGATION     Family History  Problem Relation Age of Onset   Hypertension Mother    Diabetes Mother    Hypertension Father    Diabetes Sister    Diabetes Brother     Hypothyroidism Daughter    Heart disease Son    Hypothyroidism Son    Social History   Socioeconomic History   Marital status: Widowed    Spouse name: Not on file   Number of children: 3   Years of education: Not on file   Highest education level: Not on file  Occupational History   Occupation: retired day care worker    Employer: RETIRED  Tobacco Use   Smoking status: Never   Smokeless tobacco: Never  Vaping Use   Vaping status: Never Used  Substance and Sexual Activity   Alcohol use: No   Drug use: No   Sexual activity: Never  Other Topics Concern   Not on file  Social History Narrative   Regular exercise--minimal   She is working on setting up end of life planning.   No living will. Full code (reviewed 2013)               Social Drivers of Health   Financial Resource Strain: Low Risk  (02/08/2023)   Overall Financial Resource Strain (CARDIA)    Difficulty of Paying Living Expenses: Not hard at all  Food Insecurity: No Food Insecurity (02/08/2023)   Hunger Vital Sign    Worried About Running Out of Food in the Last Year: Never true    Ran Out of Food in the Last Year: Never true  Transportation Needs: No Transportation Needs (02/08/2023)   PRAPARE - Administrator, Civil Service (Medical): No    Lack of Transportation (Non-Medical): No  Physical Activity: Insufficiently Active (02/08/2023)   Exercise Vital Sign    Days of Exercise per Week: 7 days    Minutes of Exercise per Session: 20 min  Stress: No Stress Concern Present (02/08/2023)   [employer] of Occupational Health - Occupational Stress Questionnaire    Feeling of Stress : Not at all  Social Connections: Moderately Integrated (02/08/2023)   Social Connection and Isolation Panel [NHANES]    Frequency of Communication with Friends and Family: More than three times a week    Frequency of Social Gatherings with Friends and Family: More than three times a week    Attends Religious Services:  More than 4 times per year    Active Member of [employer] or Organizations: Yes    Attends Banker Meetings: More than 4 times per year    Marital Status: Widowed    Tobacco Counseling Counseling given: Not Answered   Clinical Intake:  Pre-visit preparation completed: No  Pain : No/denies pain   BMI - recorded: 25.92 Nutritional Status: BMI 25 -29 Overweight Nutritional Risks: None Diabetes: No  How often do you need to have someone help you when you read instructions, pamphlets, or other written materials from your doctor or pharmacy?: 1 - Never  Interpreter Needed?: No  Comments: lives alone Information entered by :: B.Marisa Hage,LPN   Activities  of Daily Living    02/08/2023   10:53 AM  In your present state of health, do you have any difficulty performing the following activities:  Hearing? 0  Vision? 0  Difficulty concentrating or making decisions? 0  Walking or climbing stairs? 0  Dressing or bathing? 0  Doing errands, shopping? 0  Preparing Food and eating ? N  Using the Toilet? N  In the past six months, have you accidently leaked urine? N  Do you have problems with loss of bowel control? N  Managing your Medications? N  Managing your Finances? N  Housekeeping or managing your Housekeeping? N    Patient Care Team: Excell Seltzer, MD as PCP - General  Indicate any recent Medical Services you may have received from other than [facility] providers in the past year (date may be approximate).     Assessment:   This is a routine wellness examination for Alexandria Petersen.  Hearing/Vision screen Hearing Screening - Comments:: Pt says her hearing is ok Vision Screening - Comments:: Pt says her vision is good with glasses Dr Alvester Morin   Goals Addressed             This Visit's Progress    Maintain healthy lifestyle   On track    Continue: Stay active Healthy diet Stay hydrated     Patient Stated   On track    Continue 02/08/23, I will continue to take  medications as prescribed.      COMPLETED: Patient Stated   On track    02/03/2019,  I will maintain and continue medications as prescribed.      Patient Stated   On track    02/08/23 (pt continues) I will continue to do some strength training daily for about 15 minutes.      Patient Stated   On track    Would like to walk more, drink more water and eat healthier        Depression Screen    02/08/2023   10:46 AM 04/26/2022   10:47 AM 02/06/2022    2:23 PM 02/04/2021    1:59 PM 02/04/2020    2:07 PM 02/03/2019    9:09 AM 01/28/2018   12:36 PM  PHQ 2/9 Scores  PHQ - 2 Score 0 0 0 0 0 0 0  PHQ- 9 Score  0   0 0 0    Fall Risk    02/08/2023   10:41 AM 04/26/2022   10:47 AM 02/06/2022    2:24 PM 02/04/2021    1:57 PM 02/04/2020    2:06 PM  Fall Risk   Falls in the past year? 0 0 0 0 0  Number falls in past yr: 0 0 0 0 0  Injury with Fall? 0 0 0 0 0  Risk for fall due to : Orthopedic patient No Fall Risks Impaired balance/gait No Fall Risks Medication side effect  Risk for fall due to: Comment   Cane in use when ambulating.    Follow up Education provided;Falls prevention discussed Falls evaluation completed Falls evaluation completed Falls prevention discussed Falls evaluation completed;Falls prevention discussed    MEDICARE RISK AT HOME: Medicare Risk at Home Any stairs in or around the home?: Yes If so, are there any without handrails?: Yes Home free of loose throw rugs in walkways, pet beds, electrical cords, etc?: Yes Adequate lighting in your home to reduce risk of falls?: Yes Life alert?: No Use of a cane, [place] or w/c?: Yes Grab  bars in the bathroom?: Yes Shower chair or bench in shower?: No Elevated toilet seat or a handicapped toilet?: Yes  TIMED UP AND GO:  Was the test performed?  No    Cognitive Function:    02/04/2020    2:09 PM 02/03/2019    9:15 AM 01/28/2018   12:37 PM 12/25/2016    3:15 PM  MMSE - Mini Mental State Exam  Orientation to time 5 5 5 5    Orientation to Place 5 5 5 5   Registration 3 3 3 3   Attention/ Calculation 5 5 0 0  Recall 3 2 1 1   Recall-comments   unable to recall 2 of 3 words unable to recall 2 of 3 words  Language- name 2 objects   0 0  Language- repeat 1 1 1 1   Language- follow 3 step command   3 3  Language- read & follow direction   0 0  Write a sentence   0 0  Copy design   0 0  Total score   18 18        02/08/2023   10:55 AM 02/06/2022    2:33 PM  6CIT Screen  What Year? 0 points 0 points  What month? 0 points 0 points  What time? 0 points 0 points  Count back from 20 0 points 0 points  Months in reverse 0 points 0 points  Repeat phrase 0 points 0 points  Total Score 0 points 0 points    Immunizations Immunization History  Administered Date(s) Administered   Fluad Quad(high Dose 65+) 09/17/2018, 10/07/2019, 10/26/2021   Influenza Whole 10/16/2008, 10/06/2009   Influenza, High Dose Seasonal PF 10/28/2015, 10/26/2016, 10/13/2020, 10/20/2022   Influenza,inj,Quad PF,6+ Mos 11/05/2012, 10/30/2013, 10/30/2014, 10/25/2017   PFIZER(Purple Top)SARS-COV-2 Vaccination 02/15/2019, 03/12/2019, 11/21/2019   PPD Test 04/21/2014   Pneumococcal Conjugate-13 12/10/2014   Pneumococcal Polysaccharide-23 10/26/2009   Td 08/29/2005   Tdap 04/05/2010   Zoster, Live 11/09/2009    TDAP status: Up to date  Flu Vaccine status: Up to date  Pneumococcal vaccine status: Up to date  Covid-19 vaccine status: Completed vaccines  Qualifies for Shingles Vaccine? Yes   Zostavax completed No   Shingrix Completed?: No.    Education has been provided regarding the importance of this vaccine. Patient has been advised to call insurance company to determine out of pocket expense if they have not yet received this vaccine. Advised may also receive vaccine at local pharmacy or Health Dept. Verbalized acceptance and understanding.  Screening Tests Health Maintenance  Topic Date Due   Zoster Vaccines- Shingrix (1 of 2)  11/25/1988   DTaP/Tdap/Td (3 - Td or Tdap) 04/04/2020   COVID-19 Vaccine (4 - 2024-25 season) 09/10/2022   MAMMOGRAM  07/17/2023   Medicare Annual Wellness (AWV)  02/08/2024   DEXA SCAN  07/15/2026   Pneumonia Vaccine 68+ Years old  Completed   INFLUENZA VACCINE  Completed   HPV VACCINES  Aged Out    Health Maintenance  Health Maintenance Due  Topic Date Due   Zoster Vaccines- Shingrix (1 of 2) 11/25/1988   DTaP/Tdap/Td (3 - Td or Tdap) 04/04/2020   COVID-19 Vaccine (4 - 2024-25 season) 09/10/2022    Colorectal cancer screening: No longer required.   Mammogram status: No longer required due to age.  Bone Density status: Completed 07/14/2021. Results reflect: Bone density results: OSTEOPENIA. Repeat every 5 years.  Lung Cancer Screening: (Low Dose CT Chest recommended if Age 85-80 years, 20 pack-year currently smoking  OR have quit w/in 15years.) does not qualify.   Lung Cancer Screening Referral: no  Additional Screening:  Hepatitis C Screening: does not qualify; Completed no  Vision Screening: Recommended annual ophthalmology exams for early detection of glaucoma and other disorders of the eye. Is the patient up to date with their annual eye exam?  Yes  Who is the provider or what is the name of the office in which the patient attends annual eye exams? Dr Alvester Morin If pt is not established with a provider, would they like to be referred to a provider to establish care? No .   Dental Screening: Recommended annual dental exams for proper oral hygiene  Diabetic Foot Exam: n/a  Community Resource Referral / Chronic Care Management: CRR required this visit?  No   CCM required this visit?  No    Plan:     I have personally reviewed and noted the following in the patient's chart:   Medical and social history Use of alcohol, tobacco or illicit drugs  Current medications and supplements including opioid prescriptions. Patient is not currently taking opioid  prescriptions. Functional ability and status Nutritional status Physical activity Advanced directives List of other physicians Hospitalizations, surgeries, and ER visits in previous 12 months Vitals Screenings to include cognitive, depression, and falls Referrals and appointments  In addition, I have reviewed and discussed with patient certain preventive protocols, quality metrics, and best practice recommendations. A written personalized care plan for preventive services as well as general preventive health recommendations were provided to patient.    Sue Lush, LPN   1/61/0960   After Visit Summary: (MyChart) Due to this being a telephonic visit, the after visit summary with patients personalized plan was offered to patient via MyChart   Nurse Notes: The patient states she is doing well and has no concerns or questions at this time.

## 2023-02-12 NOTE — Patient Instructions (Signed)
Alexandria Petersen , Thank you for taking time to come for your Medicare Wellness Visit. I appreciate your ongoing commitment to your health goals. Please review the following plan we discussed and let me know if I can assist you in the future.   Referrals/Orders/Follow-Ups/Clinician Recommendations: none  This is a list of the screening recommended for you and due dates:  Health Maintenance  Topic Date Due   Zoster (Shingles) Vaccine (1 of 2) 11/25/1988   DTaP/Tdap/Td vaccine (3 - Td or Tdap) 04/04/2020   COVID-19 Vaccine (4 - 2024-25 season) 09/10/2022   Mammogram  07/17/2023   Medicare Annual Wellness Visit  02/08/2024   DEXA scan (bone density measurement)  07/15/2026   Pneumonia Vaccine  Completed   Flu Shot  Completed   HPV Vaccine  Aged Out    Advanced directives: (Declined) Advance directive discussed with you today. Even though you declined this today, please call our office should you change your mind, and we can give you the proper paperwork for you to fill out.  Next Medicare Annual Wellness Visit scheduled for next year: Yes 02/11/2024 @ 10:10am televisit

## 2023-02-14 ENCOUNTER — Other Ambulatory Visit: Payer: Self-pay | Admitting: Family Medicine

## 2023-02-14 DIAGNOSIS — Z1231 Encounter for screening mammogram for malignant neoplasm of breast: Secondary | ICD-10-CM

## 2023-03-08 ENCOUNTER — Other Ambulatory Visit: Payer: Self-pay | Admitting: "Endocrinology

## 2023-03-08 DIAGNOSIS — Z8639 Personal history of other endocrine, nutritional and metabolic disease: Secondary | ICD-10-CM

## 2023-03-08 NOTE — Telephone Encounter (Signed)
 Patient called to follow up on refill request because she will run out before the end of the weekend.

## 2023-03-09 ENCOUNTER — Other Ambulatory Visit: Payer: Self-pay

## 2023-03-09 NOTE — Telephone Encounter (Signed)
 Medication refill request complete

## 2023-03-15 DIAGNOSIS — E113393 Type 2 diabetes mellitus with moderate nonproliferative diabetic retinopathy without macular edema, bilateral: Secondary | ICD-10-CM | POA: Diagnosis not present

## 2023-03-30 ENCOUNTER — Telehealth: Payer: Self-pay | Admitting: *Deleted

## 2023-03-30 DIAGNOSIS — R7303 Prediabetes: Secondary | ICD-10-CM

## 2023-03-30 DIAGNOSIS — E7800 Pure hypercholesterolemia, unspecified: Secondary | ICD-10-CM

## 2023-03-30 NOTE — Telephone Encounter (Signed)
-----   Message from [name] sent at 03/30/2023  2:32 PM EDT ----- Regarding: Lab orders for Fri, 4.10.25 Patient is scheduled for CPX labs, please order future labs, Thanks , Camelia Eng

## 2023-04-20 ENCOUNTER — Encounter: Payer: Self-pay | Admitting: Family Medicine

## 2023-04-20 ENCOUNTER — Other Ambulatory Visit (INDEPENDENT_AMBULATORY_CARE_PROVIDER_SITE_OTHER): Payer: Medicare Other

## 2023-04-20 DIAGNOSIS — E7800 Pure hypercholesterolemia, unspecified: Secondary | ICD-10-CM | POA: Diagnosis not present

## 2023-04-20 DIAGNOSIS — R7303 Prediabetes: Secondary | ICD-10-CM | POA: Diagnosis not present

## 2023-04-20 LAB — COMPREHENSIVE METABOLIC PANEL WITH GFR
ALT: 16 U/L (ref 0–35)
AST: 23 U/L (ref 0–37)
Albumin: 4.7 g/dL (ref 3.5–5.2)
Alkaline Phosphatase: 45 U/L (ref 39–117)
BUN: 12 mg/dL (ref 6–23)
CO2: 30 meq/L (ref 19–32)
Calcium: 10.3 mg/dL (ref 8.4–10.5)
Chloride: 101 meq/L (ref 96–112)
Creatinine, Ser: 1.06 mg/dL (ref 0.40–1.20)
GFR: 48.28 mL/min — ABNORMAL LOW (ref >60.00)
Glucose, Bld: 112 mg/dL — ABNORMAL HIGH (ref 70–99)
Potassium: 3.6 meq/L (ref 3.5–5.1)
Sodium: 141 meq/L (ref 135–145)
Total Bilirubin: 1.6 mg/dL — ABNORMAL HIGH (ref 0.2–1.2)
Total Protein: 7.3 g/dL (ref 6.0–8.3)

## 2023-04-20 LAB — LIPID PANEL
Cholesterol: 230 mg/dL — ABNORMAL HIGH (ref 0–200)
HDL: 107.4 mg/dL (ref >39.00)
LDL Cholesterol: 101 mg/dL — ABNORMAL HIGH (ref 0–99)
NonHDL: 122.13
Total CHOL/HDL Ratio: 2
Triglycerides: 106 mg/dL (ref 0.0–149.0)
VLDL: 21.2 mg/dL (ref 0.0–40.0)

## 2023-04-20 LAB — HEMOGLOBIN A1C: Hgb A1c MFr Bld: 5.8 % (ref 4.6–6.5)

## 2023-04-20 NOTE — Progress Notes (Signed)
 No critical labs need to be addressed urgently. We will discuss labs in detail at upcoming office visit.

## 2023-04-27 ENCOUNTER — Encounter: Payer: Medicare Other | Admitting: Family Medicine

## 2023-05-02 ENCOUNTER — Encounter: Payer: Self-pay | Admitting: Family Medicine

## 2023-05-02 ENCOUNTER — Ambulatory Visit (INDEPENDENT_AMBULATORY_CARE_PROVIDER_SITE_OTHER): Payer: Medicare Other | Admitting: Family Medicine

## 2023-05-02 VITALS — BP 144/82 | HR 96 | Temp 97.8°F | Ht 62.0 in | Wt 147.0 lb

## 2023-05-02 DIAGNOSIS — E7800 Pure hypercholesterolemia, unspecified: Secondary | ICD-10-CM

## 2023-05-02 DIAGNOSIS — R7303 Prediabetes: Secondary | ICD-10-CM

## 2023-05-02 DIAGNOSIS — I10 Essential (primary) hypertension: Secondary | ICD-10-CM | POA: Diagnosis not present

## 2023-05-02 DIAGNOSIS — E890 Postprocedural hypothyroidism: Secondary | ICD-10-CM

## 2023-05-02 DIAGNOSIS — Z0000 Encounter for general adult medical examination without abnormal findings: Secondary | ICD-10-CM | POA: Diagnosis not present

## 2023-05-02 NOTE — Assessment & Plan Note (Signed)
Chronic, stable  Measurement in office always is elevated.  States when she checks it herself at home it is in the normal range.  HCTZ 12.5 mg daily Losartan 100 mg p.o. daily

## 2023-05-02 NOTE — Assessment & Plan Note (Signed)
Stable, chronic.  Continue current medication.   Pravastatin 40 mg p.o. daily 

## 2023-05-02 NOTE — Assessment & Plan Note (Signed)
Chronic,  stable Followed by Dr. Lucianne Muss Endo.

## 2023-05-02 NOTE — Assessment & Plan Note (Signed)
 Encouraged exercise, weight loss, healthy eating habits. ? ?

## 2023-05-02 NOTE — Progress Notes (Signed)
 Patient ID: Alexandria Petersen, female    DOB: Nov 13, 1940, 83 y.o.   MRN: [id number]  This visit was conducted in person.  BP (!) 144/82   Pulse 96   Temp 97.8 F (36.6 C) (Oral)   Ht 5\' 2"  (1.575 m)   Wt 147 lb (66.7 kg)   SpO2 99%   BMI 26.89 kg/m    CC:  Chief Complaint  Patient presents with   Annual Exam    Subjective:   HPI: Alexandria Petersen is a 83 y.o. female presenting on 05/02/2023 for Annual Exam  The patient presents for  complete physical and review of chronic health problems. He/She also has the following acute concerns today: none  The patient saw a LPN or RN for medicare wellness visit. 02/08/2023  Prevention and wellness was reviewed in detail. Note reviewed and important notes copied below.  05/02/23  Hypertension:  Blood pressure not at goal in office today but she does have a history of whitecoat hypertension.  At home her measurements 130/70 On losartan  hydrochlorothiazide BP Readings from Last 3 Encounters:  05/02/23 (!) 144/82  11/29/22 (!) 160/64  05/02/22 134/82  Using medication without problems or lightheadedness: none Chest pain with exertion: none Edema: none Short of breath: none Average home BPs:  130-136/70-80 Other issues:  Elevated Cholesterol: LDL tolerable control on pravastatin  40 mg daily Lab Results  Component Value Date   CHOL 230 (H) 04/20/2023   HDL 107.40 04/20/2023   LDLCALC 101 (H) 04/20/2023   LDLDIRECT 90.7 10/29/2012   TRIG 106.0 04/20/2023   CHOLHDL 2 04/20/2023  Using medications without problems: none Muscle aches:  none Diet compliance: heart healthy diet Exercise: walking Other complaints: Body mass index is 26.89 kg/m.  Wt Readings from Last 3 Encounters:  05/02/23 147 lb (66.7 kg)  02/08/23 151 lb (68.5 kg)  11/29/22 154 lb (69.9 kg)     Prediabetes   Lab Results  Component Value Date   HGBA1C 5.8 04/20/2023   Postablative hypothyroid Stable..Followed by Dr.  Hubert Madden. Lab Results  Component Value Date   TSH 2.99 11/24/2022         Relevant past medical, surgical, family and social history reviewed and updated as indicated. Interim medical history since our last visit reviewed. Allergies and medications reviewed and updated. Outpatient Medications Prior to Visit  Medication Sig Dispense Refill   acetaminophen  (TYLENOL ) 650 MG CR tablet Take 650 mg by mouth every 8 (eight) hours as needed for pain.     hydrochlorothiazide (MICROZIDE) 12.5 MG capsule TAKE 1 CAPSULE BY MOUTH EVERY DAY 90 capsule 3   KLOR-CON  M20 20 MEQ tablet      Lactobacillus Rhamnosus, GG, (CULTURELLE) CAPS      levothyroxine  (SYNTHROID ) 75 MCG tablet TAKE 1 TABLET BY MOUTH EVERY DAY EXCEPT ON SUNDAY ONLY TAKE 1&1/2 TABS 96 tablet 0   losartan  (COZAAR ) 100 MG tablet TAKE 1 TABLET BY MOUTH EVERY DAY 90 tablet 3   Multiple Vitamins-Minerals (CENTRUM SILVER ADULT 50+ PO) Take 1 tablet by mouth daily.     pravastatin  (PRAVACHOL ) 40 MG tablet TAKE 1 TABLET BY MOUTH EVERY DAY 90 tablet 3   No facility-administered medications prior to visit.     Per HPI unless specifically indicated in ROS section below Review of Systems  Constitutional:  Negative for fatigue and fever.  HENT:  Negative for congestion.   Eyes:  Negative for pain.  Respiratory:  Negative for cough and shortness of breath.  Cardiovascular:  Negative for chest pain, palpitations and leg swelling.  Gastrointestinal:  Negative for abdominal pain.  Genitourinary:  Negative for dysuria and vaginal bleeding.  Musculoskeletal:  Negative for back pain.  Neurological:  Negative for syncope, light-headedness and headaches.  Psychiatric/Behavioral:  Negative for dysphoric mood.    Objective:  BP (!) 144/82   Pulse 96   Temp 97.8 F (36.6 C) (Oral)   Ht 5\' 2"  (1.575 m)   Wt 147 lb (66.7 kg)   SpO2 99%   BMI 26.89 kg/m   Wt Readings from Last 3 Encounters:  05/02/23 147 lb (66.7 kg)  02/08/23 151 lb (68.5 kg)   11/29/22 154 lb (69.9 kg)      Physical Exam Vitals and nursing note reviewed.  Constitutional:      General: She is not in acute distress.    Appearance: Normal appearance. She is well-developed. She is not ill-appearing or toxic-appearing.  HENT:     Head: Normocephalic.     Right Ear: Hearing, tympanic membrane, ear canal and external ear normal.     Left Ear: Hearing, tympanic membrane, ear canal and external ear normal.     Nose: Nose normal.  Eyes:     General: Lids are normal. Lids are everted, no foreign bodies appreciated.     Conjunctiva/sclera: Conjunctivae normal.     Pupils: Pupils are equal, round, and reactive to light.  Neck:     Thyroid : No thyroid  mass or thyromegaly.     Vascular: No carotid bruit.     Trachea: Trachea normal.  Cardiovascular:     Rate and Rhythm: Normal rate and regular rhythm.     Heart sounds: Normal heart sounds, S1 normal and S2 normal. No murmur heard.    No gallop.  Pulmonary:     Effort: Pulmonary effort is normal. No respiratory distress.     Breath sounds: Normal breath sounds. No wheezing, rhonchi or rales.  Abdominal:     General: Bowel sounds are normal. There is no distension or abdominal bruit.     Palpations: Abdomen is soft. There is no fluid wave or mass.     Tenderness: There is no abdominal tenderness. There is no guarding or rebound.     Hernia: No hernia is present.  Musculoskeletal:     Cervical back: Normal range of motion and neck supple.     Comments: Right fourth digit needed triggering, no pain.  Lymphadenopathy:     Cervical: No cervical adenopathy.  Skin:    General: Skin is warm and dry.     Findings: No rash.  Neurological:     Mental Status: She is alert.     Cranial Nerves: No cranial nerve deficit.     Sensory: No sensory deficit.  Psychiatric:        Mood and Affect: Mood is not anxious or depressed.        Speech: Speech normal.        Behavior: Behavior normal. Behavior is cooperative.         Judgment: Judgment normal.       Results for orders placed or performed in visit on 04/20/23  Comprehensive metabolic panel   Collection Time: 04/20/23  7:30 AM  Result Value Ref Range   Sodium 141 135 - 145 mEq/L   Potassium 3.6 3.5 - 5.1 mEq/L   Chloride 101 96 - 112 mEq/L   CO2 30 19 - 32 mEq/L   Glucose, Bld 112 (H) 70 -  99 mg/dL   BUN 12 6 - 23 mg/dL   Creatinine, Ser 6.96 0.40 - 1.20 mg/dL   Total Bilirubin 1.6 (H) 0.2 - 1.2 mg/dL   Alkaline Phosphatase 45 39 - 117 U/L   AST 23 0 - 37 U/L   ALT 16 0 - 35 U/L   Total Protein 7.3 6.0 - 8.3 g/dL   Albumin 4.7 3.5 - 5.2 g/dL   GFR 29.52 (L) >84.13 mL/min   Calcium 10.3 8.4 - 10.5 mg/dL  Hemoglobin K4M   Collection Time: 04/20/23  7:30 AM  Result Value Ref Range   Hgb A1c MFr Bld 5.8 4.6 - 6.5 %  Lipid panel   Collection Time: 04/20/23  7:30 AM  Result Value Ref Range   Cholesterol 230 (H) 0 - 200 mg/dL   Triglycerides 010.2 0.0 - 149.0 mg/dL   HDL 725.36 >64.40 mg/dL   VLDL 34.7 0.0 - 42.5 mg/dL   LDL Cholesterol 956 (H) 0 - 99 mg/dL   Total CHOL/HDL Ratio 2    NonHDL 122.13     This visit occurred during the SARS-CoV-2 public health emergency.  Safety protocols were in place, including screening questions prior to the visit, additional usage of staff PPE, and extensive cleaning of exam room while observing appropriate contact time as indicated for disinfecting solutions.   COVID 19 screen:  No recent travel or known exposure to COVID19 The patient denies respiratory symptoms of COVID 19 at this time. The importance of social distancing was discussed today.   Assessment and Plan The patient's preventative maintenance and recommended screening tests for an annual wellness exam were reviewed in full today. Brought up to date unless services declined.  Counselled on the importance of diet, exercise, and its role in overall health and mortality. The patient's FH and SH was reviewed, including their home life, tobacco  status, and drug and alcohol status.   Vaccines: uptodate Td, flu,  COVID vaccine x 3 , consider Shingrix Pap/DVE:  No pap indicated Mammo:  nml 2022, 2023, 2024, has appt for 07/2023 Bone Density: 07/2021  normal Colon: yearly stool cards.. neg 01/2017, no further indicated Smoking Status: nonsmoker  Routine general medical examination at a health care facility  HYPERTENSION, BENIGN ESSENTIAL Assessment & Plan: Chronic, stable  Measurement in office always is elevated.  States when she checks it herself at home it is in the normal range.  HCTZ 12.5 mg daily Losartan  100 mg p.o. daily   HYPERCHOLESTEROLEMIA, PURE Assessment & Plan: Stable, chronic.  Continue current medication.  Pravastatin  40 mg p.o. daily   Prediabetes Assessment & Plan: Encouraged exercise, weight loss, healthy eating habits.    Postablative hypothyroidism Assessment & Plan: Chronic,  stable Followed by Dr. Hubert Madden Endo.         Herby Lolling, MD

## 2023-05-15 ENCOUNTER — Other Ambulatory Visit: Payer: Self-pay

## 2023-05-15 DIAGNOSIS — Z8639 Personal history of other endocrine, nutritional and metabolic disease: Secondary | ICD-10-CM

## 2023-05-15 DIAGNOSIS — E890 Postprocedural hypothyroidism: Secondary | ICD-10-CM

## 2023-05-22 ENCOUNTER — Other Ambulatory Visit: Payer: Medicare Other

## 2023-05-22 DIAGNOSIS — E890 Postprocedural hypothyroidism: Secondary | ICD-10-CM | POA: Diagnosis not present

## 2023-05-22 DIAGNOSIS — Z8639 Personal history of other endocrine, nutritional and metabolic disease: Secondary | ICD-10-CM | POA: Diagnosis not present

## 2023-05-22 LAB — TSH+FREE T4: TSH W/REFLEX TO FT4: 1.3 m[IU]/L (ref 0.40–4.50)

## 2023-05-23 ENCOUNTER — Ambulatory Visit: Payer: Self-pay | Admitting: "Endocrinology

## 2023-05-29 ENCOUNTER — Encounter: Payer: Self-pay | Admitting: "Endocrinology

## 2023-05-29 ENCOUNTER — Ambulatory Visit: Payer: Medicare Other | Admitting: "Endocrinology

## 2023-05-29 VITALS — BP 130/70 | HR 84 | Ht 62.0 in | Wt 146.0 lb

## 2023-05-29 DIAGNOSIS — Z8639 Personal history of other endocrine, nutritional and metabolic disease: Secondary | ICD-10-CM

## 2023-05-29 DIAGNOSIS — E890 Postprocedural hypothyroidism: Secondary | ICD-10-CM

## 2023-05-29 MED ORDER — LEVOTHYROXINE SODIUM 75 MCG PO TABS: ORAL_TABLET | ORAL | 2 refills | Status: DC

## 2023-05-29 NOTE — Progress Notes (Signed)
 Outpatient Endocrinology Note Alexandria Newcomer, MD  05/29/23   Alexandria Petersen [birth year] [id number]  Referring Provider: Judithann Novas, MD Primary Care Provider: Judithann Novas, MD Subjective  No chief complaint on file.   Assessment & Plan  Diagnoses and all orders for this visit:  History of Graves' disease -     TSH + free T4 -     levothyroxine  (SYNTHROID ) 75 MCG tablet; TAKE 1 TABLET BY MOUTH EVERY DAY EXCEPT ON SUNDAY ONLY TAKE 1&1/2 TABS  Postablative hypothyroidism -     TSH + free T4    [name] is currently taking levothyroxine  75 mcg every day (except Sunday takes 1.5 pills). Patient is currently biochemically euthyroid.  Educated on thyroid  axis.  Recommend the following: Take  levothyroxine  75 mcg every day (except Sunday takes 1.5 pills). Advised to take levothyroxine  first thing in the morning on empty stomach and wait at least 30 minutes to 1 hour before eating or drinking anything or taking any other medications. Space out levothyroxine  by 4 hours from any acid reflux medication/fibrate/iron/calcium/multivitamin. Advised to take nutritional supplements in the evening. Repeat lab before next visit or sooner if symptoms of hyperthyroidism or hypothyroidism develop.  Notify us  immediately in case of significant weight gain or loss. Counseled on compliance and follow up needs.  I have reviewed current medications, nurse's notes, allergies, vital signs, past medical and surgical history, family medical history, and social history for this encounter. Counseled patient on symptoms, examination findings, lab findings, imaging results, treatment decisions and monitoring and prognosis. The patient understood the recommendations and agrees with the treatment plan. All questions regarding treatment plan were fully answered.   Return in about 6 months (around 11/29/2023) for visit + labs before next visit.   Alexandria Newcomer, MD   05/29/23   I have reviewed current medications, nurse's notes, allergies, vital signs, past medical and surgical history, family medical history, and social history for this encounter. Counseled patient on symptoms, examination findings, lab findings, imaging results, treatment decisions and monitoring and prognosis. The patient understood the recommendations and agrees with the treatment plan. All questions regarding treatment plan were fully answered.   History of Present Illness Alexandria Petersen is a 83 y.o. year old female who presents to our clinic with postablative hypothyroidism.    When she was first seen in 12/16 she had symptoms of weight loss,  palpitations, feeling tired, occasionally feeling warm and some weakness in her legs. Also was having decreased appetite. She previously had suppressed TSH levels since at least 10/2013 Pretreatment labs showed increased free T4 and free T3 levels in 12/16. She was also found to have a goiter on exam initially.  Was started on Methimazole  5 mg twice a day in 12/2014. Her methimazole  was tapered off and stopped in 09/2015 when her free T4 was low normal. However without her methimazole  she had a recurrence of her hyperthyroidism in 1/18; she was having symptoms of palpitations and fatigue. She finally had her I-131 treatment on 05/02/16 with 18.1 mCi.  Currently on levothyroxine  75 mcg every day.  Symptoms suggestive of HYPOTHYROIDISM:  fatigue No weight gain No cold intolerance  No constipation  No  Symptoms suggestive of HYPERTHYROIDISM:  weight loss  Yes, some and slow  heat intolerance No hyperdefecation  No palpitations  No  Compressive symptoms:  dysphagia  No dysphonia  No positional dyspnea (especially with simultaneous arms elevation)  No  Smokes  No On biotin  No Personal history of head/neck surgery/irradiation  Yes  Physical Exam  BP 130/70   Pulse 84   Ht 5\' 2"  (1.575 m)   Wt 146 lb (66.2 kg)   SpO2  100%   BMI 26.70 kg/m  Constitutional: well developed, well nourished Head: normocephalic, atraumatic, no exophthalmos Eyes: sclera anicteric, no redness Neck: no thyromegaly, no thyroid  tenderness; no nodules palpated Lungs: normal respiratory effort Neurology: alert and oriented, no fine hand tremor Skin: dry, no appreciable rashes Musculoskeletal: no appreciable defects Psychiatric: normal mood and affect  Allergies Allergies  Allergen Reactions   Penicillins     REACTION: hallucinations    Current Medications Patient's Medications  New Prescriptions   No medications on file  Previous Medications   ACETAMINOPHEN  (TYLENOL ) 650 MG CR TABLET    Take 650 mg by mouth every 8 (eight) hours as needed for pain.   HYDROCHLOROTHIAZIDE (MICROZIDE) 12.5 MG CAPSULE    TAKE 1 CAPSULE BY MOUTH EVERY DAY   KLOR-CON  M20 20 MEQ TABLET       LACTOBACILLUS RHAMNOSUS, GG, (CULTURELLE) CAPS       LOSARTAN  (COZAAR ) 100 MG TABLET    TAKE 1 TABLET BY MOUTH EVERY DAY   MULTIPLE VITAMINS-MINERALS (CENTRUM SILVER ADULT 50+ PO)    Take 1 tablet by mouth daily.   PRAVASTATIN  (PRAVACHOL ) 40 MG TABLET    TAKE 1 TABLET BY MOUTH EVERY DAY  Modified Medications   Modified Medication Previous Medication   LEVOTHYROXINE  (SYNTHROID ) 75 MCG TABLET levothyroxine  (SYNTHROID ) 75 MCG tablet      TAKE 1 TABLET BY MOUTH EVERY DAY EXCEPT ON SUNDAY ONLY TAKE 1&1/2 TABS    TAKE 1 TABLET BY MOUTH EVERY DAY EXCEPT ON SUNDAY ONLY TAKE 1&1/2 TABS  Discontinued Medications   No medications on file    Past Medical History Past Medical History:  Diagnosis Date   Arthritis    knee   HLD (hyperlipidemia)    Hypertension    Hypothyroidism    Osteoporosis    Wears dentures    Full upper and lower.  Only wears upper.    Past Surgical History Past Surgical History:  Procedure Laterality Date   BREAST BIOPSY Left    CATARACT EXTRACTION W/PHACO Left 07/02/2019   Procedure: CATARACT EXTRACTION PHACO AND INTRAOCULAR LENS  PLACEMENT (IOC) LEFT 4.51 00:56.4;  Surgeon: Annell Kidney, MD;  Location: [facility];  Service: Ophthalmology;  Laterality: Left;   CATARACT EXTRACTION W/PHACO Right 09/03/2019   Procedure: CATARACT EXTRACTION PHACO AND INTRAOCULAR LENS PLACEMENT (IOC) RIGHT DIABETIC 9.40 01:13.6 12.8 %;  Surgeon: Annell Kidney, MD;  Location: [facility];  Service: Ophthalmology;  Laterality: Right;   HEMORROIDECTOMY     ?   INTRAOCULAR LENS INSERTION Bilateral    ROTATOR CUFF REPAIR Right    TUBAL LIGATION      Family History family history includes Diabetes in her brother, mother, and sister; Heart disease in her son; Hypertension in her father and mother; Hypothyroidism in her daughter and son.  Social History Social History   Socioeconomic History   Marital status: Widowed    Spouse name: Not on file   Number of children: 3   Years of education: Not on file   Highest education level: Not on file  Occupational History   Occupation: retired day care worker    Employer: RETIRED  Tobacco Use   Smoking status: Never   Smokeless tobacco: Never  Vaping Use   Vaping status: Never Used  Substance and  Sexual Activity   Alcohol use: No   Drug use: No   Sexual activity: Never  Other Topics Concern   Not on file  Social History Narrative   Regular exercise--minimal   She is working on setting up end of life planning.   No living will. Full code (reviewed 2013)               Social Drivers of Health   Financial Resource Strain: Low Risk  (02/08/2023)   Overall Financial Resource Strain (CARDIA)    Difficulty of Paying Living Expenses: Not hard at all  Food Insecurity: No Food Insecurity (02/08/2023)   Hunger Vital Sign    Worried About Running Out of Food in the Last Year: Never true    Ran Out of Food in the Last Year: Never true  Transportation Needs: No Transportation Needs (02/08/2023)   PRAPARE - Administrator, Civil Service (Medical): No     Lack of Transportation (Non-Medical): No  Physical Activity: Insufficiently Active (02/08/2023)   Exercise Vital Sign    Days of Exercise per Week: 7 days    Minutes of Exercise per Session: 20 min  Stress: No Stress Concern Present (02/08/2023)   [employer] of Occupational Health - Occupational Stress Questionnaire    Feeling of Stress : Not at all  Social Connections: Moderately Integrated (02/08/2023)   Social Connection and Isolation Panel [NHANES]    Frequency of Communication with Friends and Family: More than three times a week    Frequency of Social Gatherings with Friends and Family: More than three times a week    Attends Religious Services: More than 4 times per year    Active Member of [employer] or Organizations: Yes    Attends Banker Meetings: More than 4 times per year    Marital Status: Widowed  Intimate Partner Violence: Not At Risk (02/08/2023)   Humiliation, Afraid, Rape, and Kick questionnaire    Fear of Current or Ex-Partner: No    Emotionally Abused: No    Physically Abused: No    Sexually Abused: No    Laboratory Investigations Lab Results  Component Value Date   TSH 2.99 11/24/2022   TSH 2.22 04/19/2022   TSH 1.61 04/21/2021   FREET4 1.45 11/24/2022   FREET4 1.12 04/19/2022   FREET4 1.25 04/21/2021     No results found for: "TSI"   No components found for: "TRAB"   Lab Results  Component Value Date   CHOL 230 (H) 04/20/2023   Lab Results  Component Value Date   HDL 107.40 04/20/2023   Lab Results  Component Value Date   LDLCALC 101 (H) 04/20/2023   Lab Results  Component Value Date   TRIG 106.0 04/20/2023   Lab Results  Component Value Date   CHOLHDL 2 04/20/2023   Lab Results  Component Value Date   CREATININE 1.06 04/20/2023   Lab Results  Component Value Date   GFR 48.28 (L) 04/20/2023      Component Value Date/Time   NA 141 04/20/2023 0730   K 3.6 04/20/2023 0730   CL 101 04/20/2023 0730   CO2 30 04/20/2023  0730   GLUCOSE 112 (H) 04/20/2023 0730   BUN 12 04/20/2023 0730   CREATININE 1.06 04/20/2023 0730   CALCIUM 10.3 04/20/2023 0730   PROT 7.3 04/20/2023 0730   ALBUMIN 4.7 04/20/2023 0730   AST 23 04/20/2023 0730   ALT 16 04/20/2023 0730   ALKPHOS 45 04/20/2023 0730  BILITOT 1.6 (H) 04/20/2023 0730   GFRNONAA 48 (L) 02/24/2019 0715   GFRAA 56 (L) 02/24/2019 0715      Latest Ref Rng & Units 04/20/2023    7:30 AM 04/19/2022    9:01 AM 04/21/2021   12:04 PM  BMP  Glucose 70 - 99 mg/dL 644  034  742   BUN 6 - 23 mg/dL 12  17  15    Creatinine 0.40 - 1.20 mg/dL 5.95  6.38  7.56   Sodium 135 - 145 mEq/L 141  139  139   Potassium 3.5 - 5.1 mEq/L 3.6  3.7  3.7   Chloride 96 - 112 mEq/L 101  102  103   CO2 19 - 32 mEq/L 30  28  27    Calcium 8.4 - 10.5 mg/dL 43.3  29.5  18.8        Component Value Date/Time   WBC 11.4 (H) 02/24/2019 0715   RBC 4.77 02/24/2019 0715   HGB 14.4 02/24/2019 0715   HCT 43.1 02/24/2019 0715   PLT 263 02/24/2019 0715   MCV 90.4 02/24/2019 0715   MCH 30.2 02/24/2019 0715   MCHC 33.4 02/24/2019 0715   RDW 14.0 02/24/2019 0715   LYMPHSABS 1.5 02/24/2019 0715   MONOABS 0.9 02/24/2019 0715   EOSABS 0.1 02/24/2019 0715   BASOSABS 0.1 02/24/2019 0715      Parts of this note may have been dictated using voice recognition software. There may be variances in spelling and vocabulary which are unintentional. Not all errors are proofread. Please notify the [facility] if any discrepancies are noted or if the meaning of any statement is not clear.

## 2023-06-03 ENCOUNTER — Other Ambulatory Visit: Payer: Self-pay | Admitting: Family Medicine

## 2023-07-18 ENCOUNTER — Ambulatory Visit: Payer: Medicare Other

## 2023-08-03 ENCOUNTER — Ambulatory Visit
Admission: RE | Admit: 2023-08-03 | Discharge: 2023-08-03 | Disposition: A | Discharge status: Home / Self Care | Source: Ambulatory Visit | Attending: Family Medicine | Admitting: Family Medicine

## 2023-08-03 DIAGNOSIS — Z1231 Encounter for screening mammogram for malignant neoplasm of breast: Secondary | ICD-10-CM | POA: Diagnosis not present

## 2023-08-08 ENCOUNTER — Ambulatory Visit: Payer: Self-pay | Admitting: Family Medicine

## 2023-08-27 ENCOUNTER — Other Ambulatory Visit: Payer: Self-pay | Admitting: Endocrinology

## 2023-08-27 DIAGNOSIS — Z8639 Personal history of other endocrine, nutritional and metabolic disease: Secondary | ICD-10-CM

## 2023-09-27 DIAGNOSIS — E113393 Type 2 diabetes mellitus with moderate nonproliferative diabetic retinopathy without macular edema, bilateral: Secondary | ICD-10-CM | POA: Diagnosis not present

## 2023-11-06 ENCOUNTER — Encounter: Payer: Self-pay | Admitting: Pharmacist

## 2023-11-06 NOTE — Progress Notes (Unsigned)
 Pharmacy Quality Measure Review  This patient is appearing on a report for being at risk of failing the adherence measure for cholesterol (statin) and hypertension (ACEi/ARB) medications this calendar year.   Medication: losartan  100 mg, pravastatin  40 mg Last fill date: 06/05/23 for 90 day supply  Pravastatin  40 mg:  Insurance report was not up to date. No action needed at this time.  Medication refilled as of 10/23/23 x90 ds  Losartan  100 mg: ***

## 2023-11-06 NOTE — Progress Notes (Signed)
 Alexandria Petersen                                          MRN: [id number]   11/06/2023   The [facility] Quality Team Specialist reviewed this patient medical record for the purposes of chart review for care gap closure. The following were reviewed: chart review for care gap closure-kidney health evaluation for diabetes:eGFR  and uACR.    [facility] Quality Team

## 2023-12-24 NOTE — Progress Notes (Signed)
 Alexandria Petersen                                          MRN: [id number]   12/24/2023   The [facility] Quality Team Specialist reviewed this patient medical record for the purposes of chart review for care gap closure. The following were reviewed: chart review for care gap closure-kidney health evaluation for diabetes:eGFR  and uACR.    [facility] Quality Team

## 2024-01-25 ENCOUNTER — Other Ambulatory Visit: Payer: Self-pay | Admitting: Family Medicine

## 2024-01-25 DIAGNOSIS — Z1231 Encounter for screening mammogram for malignant neoplasm of breast: Secondary | ICD-10-CM

## 2024-02-11 ENCOUNTER — Ambulatory Visit: Payer: Medicare Other

## 2024-04-30 ENCOUNTER — Other Ambulatory Visit

## 2024-05-07 ENCOUNTER — Encounter: Admitting: Family Medicine

## 2024-05-29 ENCOUNTER — Ambulatory Visit

## 2024-08-04 ENCOUNTER — Ambulatory Visit
